# Patient Record
Sex: Female | Born: 1960
Health system: Southern US, Community
[De-identification: ages and names within clinical notes are randomized; demographics above are authoritative.]

## PROBLEM LIST (undated history)

## (undated) DIAGNOSIS — I10 Essential (primary) hypertension: Secondary | ICD-10-CM

## (undated) DIAGNOSIS — F32A Depression, unspecified: Secondary | ICD-10-CM

## (undated) DIAGNOSIS — F419 Anxiety disorder, unspecified: Secondary | ICD-10-CM

## (undated) DIAGNOSIS — K635 Polyp of colon: Secondary | ICD-10-CM

## (undated) DIAGNOSIS — G5139 Clonic hemifacial spasm, unspecified: Secondary | ICD-10-CM

## (undated) DIAGNOSIS — M62838 Other muscle spasm: Secondary | ICD-10-CM

## (undated) DIAGNOSIS — F329 Major depressive disorder, single episode, unspecified: Secondary | ICD-10-CM

## (undated) HISTORY — DX: Depression, unspecified: F32.A

## (undated) HISTORY — DX: Major depressive disorder, single episode, unspecified: F32.9

## (undated) HISTORY — PX: POLYPECTOMY: SHX149

## (undated) HISTORY — DX: Anxiety disorder, unspecified: F41.9

## (undated) HISTORY — DX: Polyp of colon: K63.5

## (undated) HISTORY — DX: Clonic hemifacial spasm, unspecified: G51.39

## (undated) HISTORY — PX: WISDOM TOOTH EXTRACTION: SHX21

## (undated) HISTORY — PX: TUBAL LIGATION: SHX77

---
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
## 2012-02-29 ENCOUNTER — Emergency Department (HOSPITAL_BASED_OUTPATIENT_CLINIC_OR_DEPARTMENT_OTHER)
Admission: EM | Admit: 2012-02-29 | Discharge: 2012-02-29 | Disposition: A | Discharge status: Home / Self Care | Payer: Self-pay | Attending: Emergency Medicine | Admitting: Emergency Medicine

## 2012-02-29 ENCOUNTER — Encounter (HOSPITAL_BASED_OUTPATIENT_CLINIC_OR_DEPARTMENT_OTHER): Payer: Self-pay | Admitting: Family Medicine

## 2012-02-29 DIAGNOSIS — J329 Chronic sinusitis, unspecified: Secondary | ICD-10-CM

## 2012-02-29 DIAGNOSIS — J3489 Other specified disorders of nose and nasal sinuses: Secondary | ICD-10-CM | POA: Insufficient documentation

## 2012-02-29 DIAGNOSIS — I10 Essential (primary) hypertension: Secondary | ICD-10-CM | POA: Insufficient documentation

## 2012-02-29 HISTORY — DX: Essential (primary) hypertension: I10

## 2012-02-29 MED ORDER — AMOXICILLIN 500 MG PO CAPS: 500.0000 mg | ORAL_CAPSULE | Freq: Three times a day (TID) | ORAL | Status: AC

## 2012-02-29 NOTE — ED Notes (Signed)
Pt c/o nasal congestion and drainage x 2-3 wks. Pt sts she is taking mucinex without relief. Denies fever, n/v/d.

## 2012-02-29 NOTE — ED Provider Notes (Signed)
History     CSN: [id number]  Arrival date & time 02/29/12  1243   First MD Initiated Contact with Patient 02/29/12 1310      Chief Complaint  Patient presents with  . Nasal Congestion    (Consider location/radiation/quality/duration/timing/severity/associated sxs/prior treatment) Patient is a 51 y.o. female presenting with URI. The history is provided by the patient. No language interpreter was used.  URI The primary symptoms include ear pain, sore throat, cough and nausea. The current episode started more than 1 week ago. This is a new problem. The problem has been gradually worsening.  Symptoms associated with the illness include facial pain. The illness is not associated with chills.   Pt complains of sinus congestion and drainage.  Pt reports phelgm in throat.  Pt has a history of facial spasm.  And htn Past Medical History  Diagnosis Date  . Hypertension     Past Surgical History  Procedure Date  . Tubal ligation     No family history on file.  History  Substance Use Topics  . Smoking status: Never Smoker   . Smokeless tobacco: Not on file  . Alcohol Use: No    OB History    Grav Para Term Preterm Abortions TAB SAB Ect Mult Living                  Review of Systems  Constitutional: Negative for chills.  HENT: Positive for ear pain and sore throat.   Respiratory: Positive for cough.   Gastrointestinal: Positive for nausea.  All other systems reviewed and are negative.    Allergies  Review of patient's allergies indicates no known allergies.  Home Medications   Current Outpatient Rx  Name Route Sig Dispense Refill  . LISINOPRIL-HYDROCHLOROTHIAZIDE PO Oral Take by mouth.    Arloa Koh D PO Oral Take by mouth.    . TOPIRAMATE 50 MG PO TABS Oral Take 50 mg by mouth 2 (two) times daily.      BP 143/92  Pulse 86  Temp 98.9 F (37.2 C) (Oral)  Resp 18  Ht 5\' 4"  (1.626 m)  Wt 208 lb 14.4 oz (94.756 kg)  BMI 35.86 kg/m2  SpO2 99%  Physical Exam    Vitals reviewed. Constitutional: She is oriented to person, place, and time. She appears well-developed and well-nourished.  HENT:  Head: Normocephalic and atraumatic.  Eyes: Conjunctivae and EOM are normal. Pupils are equal, round, and reactive to light.       Tender maxillary sinuses,   Facial twitch under left eye  Neck: Normal range of motion.  Cardiovascular: Normal rate and regular rhythm.   Pulmonary/Chest: Effort normal.  Abdominal: Soft.  Musculoskeletal: Normal range of motion.  Neurological: She is alert and oriented to person, place, and time. She has normal reflexes.  Skin: Skin is warm.  Psychiatric: She has a normal mood and affect.    ED Course  Procedures (including critical care time)  Labs Reviewed - No data to display No results found.   No diagnosis found.    MDM  Pt given rx for amoxicillian.   Pt advised decongestant and afrin        Lonia Skinner [place], [place] 02/29/12 1342

## 2012-02-29 NOTE — ED Provider Notes (Signed)
History/physical exam/procedure(s) were performed by non-physician practitioner and as supervising physician I was immediately available for consultation/collaboration. I have reviewed all notes and am in agreement with care and plan.   Hiroko Tregre S Arturo Freundlich, MD 02/29/12 1539 

## 2012-12-13 ENCOUNTER — Emergency Department (HOSPITAL_BASED_OUTPATIENT_CLINIC_OR_DEPARTMENT_OTHER)
Admission: EM | Admit: 2012-12-13 | Discharge: 2012-12-13 | Disposition: A | Discharge status: Home / Self Care | Payer: Self-pay | Attending: Emergency Medicine | Admitting: Emergency Medicine

## 2012-12-13 ENCOUNTER — Encounter (HOSPITAL_BASED_OUTPATIENT_CLINIC_OR_DEPARTMENT_OTHER): Payer: Self-pay

## 2012-12-13 DIAGNOSIS — I10 Essential (primary) hypertension: Secondary | ICD-10-CM | POA: Insufficient documentation

## 2012-12-13 DIAGNOSIS — R51 Headache: Secondary | ICD-10-CM | POA: Insufficient documentation

## 2012-12-13 DIAGNOSIS — Z79899 Other long term (current) drug therapy: Secondary | ICD-10-CM | POA: Insufficient documentation

## 2012-12-13 DIAGNOSIS — J019 Acute sinusitis, unspecified: Secondary | ICD-10-CM | POA: Insufficient documentation

## 2012-12-13 DIAGNOSIS — Z8739 Personal history of other diseases of the musculoskeletal system and connective tissue: Secondary | ICD-10-CM | POA: Insufficient documentation

## 2012-12-13 HISTORY — DX: Other muscle spasm: M62.838

## 2012-12-13 MED ORDER — AMOXICILLIN 500 MG PO CAPS: 500.0000 mg | ORAL_CAPSULE | Freq: Three times a day (TID) | ORAL | Status: DC

## 2012-12-13 NOTE — ED Notes (Signed)
C/o sinus drainage x 1 week

## 2012-12-13 NOTE — ED Provider Notes (Signed)
History     CSN: [id number]  Arrival date & time 12/13/12  1944   First MD Initiated Contact with Patient 12/13/12 2035      Chief Complaint  Patient presents with  . URI    (Consider location/radiation/quality/duration/timing/severity/associated sxs/prior treatment) Patient is a 53 y.o. female presenting with URI. The history is provided by the patient.  URI Presenting symptoms: congestion and facial pain   Severity:  Moderate Onset quality:  Gradual Duration:  1 week Timing:  Constant Progression:  Worsening Chronicity:  New Relieved by:  Nothing Worsened by:  Nothing tried Ineffective treatments:  None tried Associated symptoms: sinus pain and swollen glands   Associated symptoms: no headaches and no myalgias     Past Medical History  Diagnosis Date  . Hypertension   . Muscle spasms of head and/or neck     Past Surgical History  Procedure Laterality Date  . Tubal ligation      No family history on file.  History  Substance Use Topics  . Smoking status: Never Smoker   . Smokeless tobacco: Not on file  . Alcohol Use: No    OB History   Grav Para Term Preterm Abortions TAB SAB Ect Mult Living                  Review of Systems  HENT: Positive for congestion.   Musculoskeletal: Negative for myalgias.  Neurological: Negative for headaches.  All other systems reviewed and are negative.    Allergies  Review of patient's allergies indicates no known allergies.  Home Medications   Current Outpatient Rx  Name  Route  Sig  Dispense  Refill  . LISINOPRIL-HYDROCHLOROTHIAZIDE PO   Oral   Take by mouth.         . Pseudoephedrine-Guaifenesin (MUCINEX D PO)   Oral   Take by mouth.         . topiramate (TOPAMAX) 50 MG tablet   Oral   Take 50 mg by mouth 2 (two) times daily.           BP 124/105  Pulse 94  Temp(Src) 98.5 F (36.9 C) (Oral)  Resp 20  SpO2 99%  Physical Exam  Constitutional: She is oriented to person, place, and time.  She appears well-developed and well-nourished. No distress.  HENT:  Head: Normocephalic and atraumatic.  Right Ear: External ear normal.  Left Ear: External ear normal.  Mouth/Throat: Oropharynx is clear and moist. No oropharyngeal exudate.  There is ttp over the maxillary sinuses.  Neck: Normal range of motion. Neck supple. No thyromegaly present.  Cardiovascular: Normal rate and regular rhythm.   No murmur heard. Pulmonary/Chest: Breath sounds normal. No respiratory distress.  Abdominal: Soft. Bowel sounds are normal.  Musculoskeletal: Normal range of motion. She exhibits no edema.  Lymphadenopathy:    She has cervical adenopathy.  Neurological: She is oriented to person, place, and time.  Skin: Skin is warm and dry. She is not diaphoretic.    ED Course  Procedures (including critical care time)  Labs Reviewed - No data to display No results found.   No diagnosis found.    MDM  Will treat for sinusitis with amoxicillin.  Follow up prn.        Geoffery Lyons, MD 12/13/12 2042

## 2015-03-03 ENCOUNTER — Emergency Department (HOSPITAL_BASED_OUTPATIENT_CLINIC_OR_DEPARTMENT_OTHER)
Admission: EM | Admit: 2015-03-03 | Discharge: 2015-03-03 | Disposition: A | Discharge status: Home / Self Care | Payer: 59 | Attending: Emergency Medicine | Admitting: Emergency Medicine

## 2015-03-03 ENCOUNTER — Encounter (HOSPITAL_BASED_OUTPATIENT_CLINIC_OR_DEPARTMENT_OTHER): Payer: Self-pay | Admitting: Emergency Medicine

## 2015-03-03 ENCOUNTER — Emergency Department (HOSPITAL_BASED_OUTPATIENT_CLINIC_OR_DEPARTMENT_OTHER): Payer: 59

## 2015-03-03 DIAGNOSIS — Z792 Long term (current) use of antibiotics: Secondary | ICD-10-CM | POA: Insufficient documentation

## 2015-03-03 DIAGNOSIS — I10 Essential (primary) hypertension: Secondary | ICD-10-CM | POA: Diagnosis not present

## 2015-03-03 DIAGNOSIS — R42 Dizziness and giddiness: Secondary | ICD-10-CM | POA: Diagnosis present

## 2015-03-03 DIAGNOSIS — Z79899 Other long term (current) drug therapy: Secondary | ICD-10-CM | POA: Diagnosis not present

## 2015-03-03 DIAGNOSIS — Z8739 Personal history of other diseases of the musculoskeletal system and connective tissue: Secondary | ICD-10-CM | POA: Diagnosis not present

## 2015-03-03 LAB — CBC WITH DIFFERENTIAL/PLATELET
Basophils Absolute: 0 10*3/uL (ref 0.0–0.1)
Basophils Relative: 0 % (ref 0–1)
EOS ABS: 0.1 10*3/uL (ref 0.0–0.7)
Eosinophils Relative: 1 % (ref 0–5)
HEMATOCRIT: 43.3 % (ref 36.0–46.0)
HEMOGLOBIN: 14.4 g/dL (ref 12.0–15.0)
LYMPHS ABS: 3.8 10*3/uL (ref 0.7–4.0)
LYMPHS PCT: 52 % — AB (ref 12–46)
MCH: 29.4 pg (ref 26.0–34.0)
MCHC: 33.3 g/dL (ref 30.0–36.0)
MCV: 88.4 fL (ref 78.0–100.0)
Monocytes Absolute: 0.7 10*3/uL (ref 0.1–1.0)
Monocytes Relative: 10 % (ref 3–12)
NEUTROS ABS: 2.6 10*3/uL (ref 1.7–7.7)
NEUTROS PCT: 37 % — AB (ref 43–77)
Platelets: 251 10*3/uL (ref 150–400)
RBC: 4.9 MIL/uL (ref 3.87–5.11)
RDW: 15.2 % (ref 11.5–15.5)
WBC: 7.2 10*3/uL (ref 4.0–10.5)

## 2015-03-03 LAB — BASIC METABOLIC PANEL
ANION GAP: 9 (ref 5–15)
BUN: 12 mg/dL (ref 6–20)
CHLORIDE: 107 mmol/L (ref 101–111)
CO2: 27 mmol/L (ref 22–32)
CREATININE: 0.9 mg/dL (ref 0.44–1.00)
Calcium: 9.7 mg/dL (ref 8.9–10.3)
GFR calc non Af Amer: >60 mL/min (ref >60)
Glucose, Bld: 122 mg/dL — ABNORMAL HIGH (ref 65–99)
POTASSIUM: 3.5 mmol/L (ref 3.5–5.1)
SODIUM: 143 mmol/L (ref 135–145)

## 2015-03-03 LAB — TROPONIN I

## 2015-03-03 MED ORDER — MECLIZINE HCL 25 MG PO TABS
25.0000 mg | ORAL_TABLET | Freq: Once | ORAL | Status: AC
  Administered 2015-03-03: 25 mg via ORAL
  Filled 2015-03-03: qty 1

## 2015-03-03 NOTE — ED Provider Notes (Signed)
CSN: [id number]   Arrival date & time 03/03/15 2133  History  This chart was scribed for Geoffery Lyons, MD by Bethel Born, ED Scribe. This patient was seen in room MH08/MH08 and the patient's care was started at 9:57 PM.  Chief Complaint  Patient presents with  . Dizziness    HPI The history is provided by the patient and a relative. No language interpreter was used.   Janet Case is a 54 y.o. female with PMHx of HTN who presents to the Emergency Department complaining of intermittent dizziness and light headedness with gradual onset tonight. Associated symptoms include mild headache. She associates the symptoms with hypertension because she has had similar episodes in the past when her blood pressure was elevated. Pt denies chest pain, palpitations, SOB, numbness, tingling, and weakness.  Her antihypertensive was changed a few months ago from Lisinopril to Amlodipine and Valsartan-Hydrochlorothiazide.  Past Medical History  Diagnosis Date  . Hypertension   . Muscle spasms of head and/or neck     Past Surgical History  Procedure Laterality Date  . Tubal ligation      History reviewed. No pertinent family history.  Social History  Substance Use Topics  . Smoking status: Never Smoker   . Smokeless tobacco: None  . Alcohol Use: No     Review of Systems 10 Systems reviewed and all are negative for acute change except as noted in the HPI.  Home Medications   Prior to Admission medications   Medication Sig Start Date End Date Taking? Authorizing Provider  AMLODIPINE BESYLATE PO Take by mouth.   Yes Historical Provider, MD  VALSARTAN-HYDROCHLOROTHIAZIDE PO Take by mouth.   Yes Historical Provider, MD  amoxicillin (AMOXIL) 500 MG capsule Take 1 capsule (500 mg total) by mouth 3 (three) times daily. 12/13/12   Geoffery Lyons, MD  LISINOPRIL-HYDROCHLOROTHIAZIDE PO Take by mouth.    Historical Provider, MD  Pseudoephedrine-Guaifenesin [facility] D PO) Take by mouth.    Historical  Provider, MD  topiramate (TOPAMAX) 50 MG tablet Take 50 mg by mouth 2 (two) times daily.    Historical Provider, MD    Allergies  Review of patient's allergies indicates no known allergies.  Triage Vitals: BP 191/130 mmHg  Pulse 107  Temp(Src) 98.7 F (37.1 C) (Oral)  Resp 18  Ht 5\' 4"  (1.626 m)  Wt 223 lb (101.152 kg)  BMI 38.26 kg/m2  SpO2 98%  Physical Exam  Constitutional: She is oriented to person, place, and time. She appears well-developed and well-nourished. No distress.  HENT:  Head: Normocephalic and atraumatic.  Eyes: EOM are normal. Pupils are equal, round, and reactive to light.  Neck: Normal range of motion.  Cardiovascular: Normal rate, regular rhythm and normal heart sounds.   Pulmonary/Chest: Effort normal and breath sounds normal. No respiratory distress. She has no wheezes. She has no rales.  Abdominal: Soft. She exhibits no distension. There is no tenderness.  Musculoskeletal: Normal range of motion.  Neurological: She is alert and oriented to person, place, and time. No cranial nerve deficit. She exhibits normal muscle tone. Coordination normal.  Skin: Skin is warm and dry.  Psychiatric: She has a normal mood and affect. Judgment normal.  Nursing note and vitals reviewed.    ED Course  Procedures   DIAGNOSTIC STUDIES: Oxygen Saturation is 98% on RA, normal by my interpretation.    COORDINATION OF CARE: 10:03 PM Discussed treatment plan which includes lab work, EKG, CT head without contrast, and meclizine  with pt at  bedside and pt agreed to plan.  Labs Reviewed  BASIC METABOLIC PANEL  CBC WITH DIFFERENTIAL/PLATELET  TROPONIN I    Imaging Review No results found.   EKG Interpretation  Date/Time:  Sunday March 03 2015 22:34:19 EDT Ventricular Rate:  94 PR Interval:  148 QRS Duration: 90 QT Interval:  368 QTC Calculation: 460 R Axis:   -23 Text Interpretation:  Normal sinus rhythm Possible Left atrial enlargement Possible Anterior  infarct , age undetermined Abnormal ECG Confirmed by Angellica Maddison  MD, Dreux Mcgroarty ([id number]) on 03/03/2015 10:35:17 PM       MDM   Final diagnoses:  None   Patient presents with complaints of dizziness and elevated blood pressure. She is neurologically intact and laboratory studies are unremarkable. Her blood pressures were observed while in the emergency department and have significantly improved. She is now 145/110 and I do not feel as though he is in need of emergent intervention. Patient has reported to me that she did not take her blood pressure medication today and I suspect that may be the reason for her symptoms. She will be discharged to home and advised to take her medications as directed.  I personally performed the services described in this documentation, which was scribed in my presence. The recorded information has been reviewed and is accurate.    Geoffery Lyons, MD 03/03/15 [phone]

## 2015-03-03 NOTE — ED Notes (Signed)
Patient reports lightheaded and dizziness which began tonight.  Reports MD changed blood pressure medication due to hypertension.  Patient reports taking valsartan and "another BP pill".

## 2015-03-03 NOTE — ED Notes (Signed)
Pt is refusing CT scan at this time. States she "just doesn't want one right now".

## 2015-03-03 NOTE — Discharge Instructions (Signed)
Take a record of your blood pressures over the next several days which you can present to your doctor at your next appointment.  Continue your medications as previously prescribed.   Hypertension Hypertension, commonly called high blood pressure, is when the force of blood pumping through your arteries is too strong. Your arteries are the blood vessels that carry blood from your heart throughout your body. A blood pressure reading consists of a higher number over a lower number, such as 110/72. The higher number (systolic) is the pressure inside your arteries when your heart pumps. The lower number (diastolic) is the pressure inside your arteries when your heart relaxes. Ideally you want your blood pressure below 120/80. Hypertension forces your heart to work harder to pump blood. Your arteries may become narrow or stiff. Having hypertension puts you at risk for heart disease, stroke, and other problems.  RISK FACTORS Some risk factors for high blood pressure are controllable. Others are not.  Risk factors you cannot control include:   Race. You may be at higher risk if you are African American.  Age. Risk increases with age.  Gender. Men are at higher risk than women before age 64 years. After age 32, women are at higher risk than men. Risk factors you can control include:  Not getting enough exercise or physical activity.  Being overweight.  Getting too much fat, sugar, calories, or salt in your diet.  Drinking too much alcohol. SIGNS AND SYMPTOMS Hypertension does not usually cause signs or symptoms. Extremely high blood pressure (hypertensive crisis) may cause headache, anxiety, shortness of breath, and nosebleed. DIAGNOSIS  To check if you have hypertension, your health care provider will measure your blood pressure while you are seated, with your arm held at the level of your heart. It should be measured at least twice using the same arm. Certain conditions can cause a difference  in blood pressure between your right and left arms. A blood pressure reading that is higher than normal on one occasion does not mean that you need treatment. If one blood pressure reading is high, ask your health care provider about having it checked again. TREATMENT  Treating high blood pressure includes making lifestyle changes and possibly taking medicine. Living a healthy lifestyle can help lower high blood pressure. You may need to change some of your habits. Lifestyle changes may include:  Following the DASH diet. This diet is high in fruits, vegetables, and whole grains. It is low in salt, red meat, and added sugars.  Getting at least 2 hours of brisk physical activity every week.  Losing weight if necessary.  Not smoking.  Limiting alcoholic beverages.  Learning ways to reduce stress. If lifestyle changes are not enough to get your blood pressure under control, your health care provider may prescribe medicine. You may need to take more than one. Work closely with your health care provider to understand the risks and benefits. HOME CARE INSTRUCTIONS  Have your blood pressure rechecked as directed by your health care provider.   Take medicines only as directed by your health care provider. Follow the directions carefully. Blood pressure medicines must be taken as prescribed. The medicine does not work as well when you skip doses. Skipping doses also puts you at risk for problems.   Do not smoke.   Monitor your blood pressure at home as directed by your health care provider. SEEK MEDICAL CARE IF:   You think you are having a reaction to medicines taken.  You have  recurrent headaches or feel dizzy.  You have swelling in your ankles.  You have trouble with your vision. SEEK IMMEDIATE MEDICAL CARE IF:  You develop a severe headache or confusion.  You have unusual weakness, numbness, or feel faint.  You have severe chest or abdominal pain.  You vomit  repeatedly.  You have trouble breathing. MAKE SURE YOU:   Understand these instructions.  Will watch your condition.  Will get help right away if you are not doing well or get worse. Document Released: 06/29/2005 Document Revised: 11/13/2013 Document Reviewed: 04/21/2013 [facility] Patient Information 2015 [place], [place]. This information is not intended to replace advice given to you by your health care provider. Make sure you discuss any questions you have with your health care provider.

## 2015-04-01 ENCOUNTER — Telehealth: Payer: Self-pay | Admitting: *Deleted

## 2015-04-01 NOTE — Telephone Encounter (Signed)
Unable to reach patient at time of Pre-Visit Call.  Left message for patient to return call when available.    

## 2015-04-02 ENCOUNTER — Ambulatory Visit: Payer: 59 | Admitting: Physician Assistant

## 2015-04-10 ENCOUNTER — Ambulatory Visit (INDEPENDENT_AMBULATORY_CARE_PROVIDER_SITE_OTHER): Payer: 59 | Admitting: Physician Assistant

## 2015-04-10 ENCOUNTER — Encounter (INDEPENDENT_AMBULATORY_CARE_PROVIDER_SITE_OTHER): Payer: Self-pay

## 2015-04-10 ENCOUNTER — Encounter: Payer: Self-pay | Admitting: Physician Assistant

## 2015-04-10 VITALS — BP 138/98 | HR 86 | Temp 98.1°F | Resp 16 | Ht 64.0 in | Wt 225.0 lb

## 2015-04-10 DIAGNOSIS — G514 Facial myokymia: Secondary | ICD-10-CM | POA: Diagnosis not present

## 2015-04-10 DIAGNOSIS — G4700 Insomnia, unspecified: Secondary | ICD-10-CM | POA: Diagnosis not present

## 2015-04-10 DIAGNOSIS — G4710 Hypersomnia, unspecified: Secondary | ICD-10-CM

## 2015-04-10 DIAGNOSIS — I10 Essential (primary) hypertension: Secondary | ICD-10-CM | POA: Diagnosis not present

## 2015-04-10 DIAGNOSIS — Z803 Family history of malignant neoplasm of breast: Secondary | ICD-10-CM

## 2015-04-10 MED ORDER — VALSARTAN-HYDROCHLOROTHIAZIDE 160-25 MG PO TABS: 1.0000 | ORAL_TABLET | Freq: Every day | ORAL | Status: DC

## 2015-04-10 MED ORDER — TOPIRAMATE 50 MG PO TABS: 50.0000 mg | ORAL_TABLET | Freq: Two times a day (BID) | ORAL | Status: DC

## 2015-04-10 MED ORDER — ZOLPIDEM TARTRATE 10 MG PO TABS: 10.0000 mg | ORAL_TABLET | Freq: Every evening | ORAL | Status: DC | PRN

## 2015-04-10 NOTE — Progress Notes (Signed)
Pre visit review using our clinic review tool, if applicable. No additional management support is needed unless otherwise documented below in the visit note/SLS  

## 2015-04-10 NOTE — Patient Instructions (Signed)
Please go to the lab for blood work. I will call you with your results. You will be contacted by Neurology for assessment, Pulmonology for sleep study and by [facility] for a screening mammogram.  Please restart the Topamax 50 mg tablet once daily x 5 days. Then increase to 50 mg twice daily. Take Ambien at bedtime as directed.  Take the new dose of Diovan daily for BP control.   Follow-up with me in 3-4 weeks for a complete physical and follow-up on your BP.

## 2015-04-10 NOTE — Progress Notes (Signed)
Patient presents to clinic today to establish care.  Acute Concerns: Insomnia -- Patient endorses 3-4 hours of sleep per night. Has significant difficulty falling asleep. Body mass index is 38.6 kg/(m^2). Has history of hypertension currently medicated. Denies ever having a sleep study.  Chronic Issues: Hypertension -- Is currently on Diovan-HCT and Amlodipine daily. Endorses taking as directed. Is watching salt intake. Patient denies chest pain, palpitations, lightheadedness, dizziness, vision changes or frequent headaches.  Anxiety/Depression -- Endorses mainly stemming from insomnia. Is not currently on medication. Endorses job stressors. Denies panic attack but endorses some depressed mood from time to time. Does not feel that medication is warranted at present.  Hemifacial Spasms -- Long-standing history, previous workup with Neurology. MRI negative for tumor per patient. Was previously having Botox injections with good relief of symptoms. Denies new or worsening symptoms. Last injection was > 1 year ago. Endorses Topamax previously helping some with symptoms before starting her injections.  Health Maintenance: Immunizations -- defers to physical Mammogram -- Overdue. + Family hx Breast cancer. Agrees to screening mammogram.  Past Medical History  Diagnosis Date  . Hypertension   . Muscle spasms of head and/or neck   . Clonic hemifacial spasm     Left Facial  . Colon polyps   . Depression   . Anxiety     Past Surgical History  Procedure Laterality Date  . Tubal ligation    . Polypectomy      Oral  . Vaginal wound closure / repair  1977  . Wisdom tooth extraction      Current Outpatient Prescriptions on File Prior to Visit  Medication Sig Dispense Refill  . Pseudoephedrine-Guaifenesin (MUCINEX D PO) Take by mouth as needed.      No current facility-administered medications on file prior to visit.    No Known Allergies  Family History  Problem Relation Age of  Onset  . Hyperlipidemia Mother 24    Deceased  . Heart disease Mother   . Congestive Heart Failure Mother   . Hypertension Mother   . Hypertension Sister   . Kidney disease Mother   . Kidney disease Father 80    Deceased  . Diabetes Mother   . Diabetes Sister   . Breast cancer Sister   . Depression Sister   . Anxiety disorder Sister   . Dementia Mother   . Thyroid cancer Sister     Thyroid Removed  . Heart defect Mother   . Heart attack Father   . Leukemia Maternal Aunt   . Allergies Daughter   . GI problems Daughter     Social History   Social History  . Marital Status: Single    Spouse Name: N/A  . Number of Children: 2  . Years of Education: N/A   Occupational History  . Customer Service Representative    Social History Main Topics  . Smoking status: Never Smoker   . Smokeless tobacco: Not on file  . Alcohol Use: No  . Drug Use: No  . Sexual Activity: Not on file   Other Topics Concern  . Not on file   Social History Narrative   Review of Systems  Constitutional: Negative for fever and weight loss.  Respiratory: Negative for cough and shortness of breath.   Cardiovascular: Negative for chest pain and palpitations.  Skin: Negative for rash.  Neurological: Positive for tremors. Negative for dizziness, sensory change, speech change, focal weakness, seizures, loss of consciousness and headaches.  Psychiatric/Behavioral: Negative for depression.  The patient has insomnia. The patient is not nervous/anxious.    BP 138/98 mmHg  Pulse 86  Temp(Src) 98.1 F (36.7 C) (Oral)  Resp 16  Ht 5\' 4"  (1.626 m)  Wt 225 lb (102.059 kg)  BMI 38.60 kg/m2  SpO2 99%  Physical Exam  Constitutional: She is oriented to person, place, and time and well-developed, well-nourished, and in no distress.  HENT:  Head: Normocephalic and atraumatic.  Eyes: Conjunctivae are normal.  Cardiovascular: Normal rate, regular rhythm, normal heart sounds and intact distal pulses.     Pulmonary/Chest: Effort normal and breath sounds normal. No respiratory distress. She has no wheezes. She has no rales. She exhibits no tenderness.  Neurological: She is alert and oriented to person, place, and time.  Left-sided facial twitching noted on exam.  Skin: Skin is warm and dry. No rash noted.  Vitals reviewed.   Recent Results (from the past 2160 hour(s))  Basic metabolic panel     Status: Abnormal   Collection Time: 03/03/15 10:30 PM  Result Value Ref Range   Sodium 143 135 - 145 mmol/L   Potassium 3.5 3.5 - 5.1 mmol/L   Chloride 107 101 - 111 mmol/L   CO2 27 22 - 32 mmol/L   Glucose, Bld 122 (H) 65 - 99 mg/dL   BUN 12 6 - 20 mg/dL   Creatinine, Ser 03/05/15 0.44 - 1.00 mg/dL   Calcium 9.7 8.9 - 3.06 mg/dL   GFR calc non Af Amer >60 >60 mL/min   GFR calc Af Amer >60 >60 mL/min    Comment: (NOTE) The eGFR has been calculated using the CKD EPI equation. This calculation has not been validated in all clinical situations. eGFR's persistently <60 mL/min signify possible Chronic Kidney Disease.    Anion gap 9 5 - 15  CBC with Differential     Status: Abnormal   Collection Time: 03/03/15 10:30 PM  Result Value Ref Range   WBC 7.2 4.0 - 10.5 K/uL   RBC 4.90 3.87 - 5.11 MIL/uL   Hemoglobin 14.4 12.0 - 15.0 g/dL   HCT 03/05/15 77.7 - 31.7 %   MCV 88.4 78.0 - 100.0 fL   MCH 29.4 26.0 - 34.0 pg   MCHC 33.3 30.0 - 36.0 g/dL   RDW 91.5 24.8 - 49.4 %   Platelets 251 150 - 400 K/uL   Neutrophils Relative % 37 (L) 43 - 77 %   Neutro Abs 2.6 1.7 - 7.7 K/uL   Lymphocytes Relative 52 (H) 12 - 46 %   Lymphs Abs 3.8 0.7 - 4.0 K/uL   Monocytes Relative 10 3 - 12 %   Monocytes Absolute 0.7 0.1 - 1.0 K/uL   Eosinophils Relative 1 0 - 5 %   Eosinophils Absolute 0.1 0.0 - 0.7 K/uL   Basophils Relative 0 0 - 1 %   Basophils Absolute 0.0 0.0 - 0.1 K/uL  Troponin I     Status: None   Collection Time: 03/03/15 10:30 PM  Result Value Ref Range   Troponin I <0.03 <0.031 ng/mL    Comment:         NO INDICATION OF MYOCARDIAL INJURY.   Basic Metabolic Panel (BMET)     Status: None   Collection Time: 04/10/15  2:44 PM  Result Value Ref Range   Sodium 142 135 - 145 mEq/L   Potassium 3.6 3.5 - 5.1 mEq/L   Chloride 102 96 - 112 mEq/L   CO2 30 19 - 32 mEq/L  Glucose, Bld 94 70 - 99 mg/dL   BUN 12 6 - 23 mg/dL   Creatinine, Ser 0.83 0.40 - 1.20 mg/dL   Calcium 9.9 8.4 - 10.5 mg/dL   GFR 91.36 >60.00 mL/min  TSH     Status: None   Collection Time: 04/10/15  2:44 PM  Result Value Ref Range   TSH 1.14 0.35 - 4.50 uIU/mL    Assessment/Plan: Insomnia Will begin short-term Ambien 10 mg nightly.   Family history of breast cancer Overdue for mammogram. Order placed for screening mammogram.  Facial twitching Long-standing. Patient endorses prior workup. Needs Neurologist in the area. Referral placed. Will check lab panel today. Will restart Topamax 50 mg daily x 5 days, then increase to BID.  Excessive somnolence disorder Body mass index is 38.6 kg/(m^2). With hypertension and heavy snoring. Concern for sleep apnea. Referral placed to Pulmonology for assessment and sleep study.  Essential hypertension Above goal. Will increase Diovan-HCT to 160-25 mg daily. DASH diet discussed. Follow-up 1 month.

## 2015-04-11 LAB — BASIC METABOLIC PANEL
BUN: 12 mg/dL (ref 6–23)
CO2: 30 mEq/L (ref 19–32)
CREATININE: 0.83 mg/dL (ref 0.40–1.20)
Calcium: 9.9 mg/dL (ref 8.4–10.5)
Chloride: 102 mEq/L (ref 96–112)
GFR: 91.36 mL/min (ref >60.00)
GLUCOSE: 94 mg/dL (ref 70–99)
POTASSIUM: 3.6 meq/L (ref 3.5–5.1)
Sodium: 142 mEq/L (ref 135–145)

## 2015-04-11 LAB — TSH: TSH: 1.14 u[IU]/mL (ref 0.35–4.50)

## 2015-04-12 DIAGNOSIS — G4700 Insomnia, unspecified: Secondary | ICD-10-CM | POA: Insufficient documentation

## 2015-04-12 DIAGNOSIS — Z803 Family history of malignant neoplasm of breast: Secondary | ICD-10-CM | POA: Insufficient documentation

## 2015-04-12 DIAGNOSIS — G4710 Hypersomnia, unspecified: Secondary | ICD-10-CM | POA: Insufficient documentation

## 2015-04-12 DIAGNOSIS — I10 Essential (primary) hypertension: Secondary | ICD-10-CM | POA: Insufficient documentation

## 2015-04-12 DIAGNOSIS — G514 Facial myokymia: Secondary | ICD-10-CM | POA: Insufficient documentation

## 2015-04-12 LAB — PTH, INTACT AND CALCIUM
Calcium: 10.3 mg/dL (ref 8.4–10.5)
PTH: 73 pg/mL — ABNORMAL HIGH (ref 14–64)

## 2015-04-12 NOTE — Assessment & Plan Note (Signed)
Overdue for mammogram. Order placed for screening mammogram.

## 2015-04-12 NOTE — Assessment & Plan Note (Signed)
Body mass index is 38.6 kg/(m^2). With hypertension and heavy snoring. Concern for sleep apnea. Referral placed to Pulmonology for assessment and sleep study.

## 2015-04-12 NOTE — Assessment & Plan Note (Signed)
Above goal. Will increase Diovan-HCT to 160-25 mg daily. DASH diet discussed. Follow-up 1 month.

## 2015-04-12 NOTE — Assessment & Plan Note (Signed)
Will begin short-term Ambien 10 mg nightly.

## 2015-04-12 NOTE — Assessment & Plan Note (Signed)
Long-standing. Patient endorses prior workup. Needs Neurologist in the area. Referral placed. Will check lab panel today. Will restart Topamax 50 mg daily x 5 days, then increase to BID.

## 2015-04-17 ENCOUNTER — Telehealth: Payer: Self-pay | Admitting: *Deleted

## 2015-04-17 DIAGNOSIS — R7989 Other specified abnormal findings of blood chemistry: Secondary | ICD-10-CM

## 2015-04-17 NOTE — Telephone Encounter (Signed)
-----   Message from Waldon Merl, PA-C sent at 04/15/2015  1:00 PM EDT ----- Labs great overall. Her parathyroid hormone level is very mildly elevated but calcium level looks good. Recommend she return for a repeat PTH and calcium level in 2 weeks. Has she heard from Neurology yet?

## 2015-04-17 NOTE — Telephone Encounter (Signed)
LMOM with contact name and number [for return call, if needed] RE: results and further provider instructions [pt already has lab appt]; new lab order placed/SLS

## 2015-04-22 ENCOUNTER — Encounter: Payer: Self-pay | Admitting: Physician Assistant

## 2015-04-25 ENCOUNTER — Other Ambulatory Visit: Payer: Self-pay | Admitting: Physician Assistant

## 2015-04-25 ENCOUNTER — Encounter: Payer: Self-pay | Admitting: Physician Assistant

## 2015-04-25 ENCOUNTER — Other Ambulatory Visit: Payer: 59

## 2015-04-25 ENCOUNTER — Ambulatory Visit (HOSPITAL_BASED_OUTPATIENT_CLINIC_OR_DEPARTMENT_OTHER)
Admission: RE | Admit: 2015-04-25 | Discharge: 2015-04-25 | Disposition: A | Discharge status: Home / Self Care | Payer: 59 | Source: Ambulatory Visit | Attending: Physician Assistant | Admitting: Physician Assistant

## 2015-04-25 DIAGNOSIS — R7989 Other specified abnormal findings of blood chemistry: Secondary | ICD-10-CM

## 2015-04-25 DIAGNOSIS — Z1231 Encounter for screening mammogram for malignant neoplasm of breast: Secondary | ICD-10-CM | POA: Insufficient documentation

## 2015-04-25 DIAGNOSIS — Z803 Family history of malignant neoplasm of breast: Secondary | ICD-10-CM

## 2015-04-29 ENCOUNTER — Telehealth: Payer: Self-pay | Admitting: Physician Assistant

## 2015-04-29 NOTE — Telephone Encounter (Signed)
Caller name: Lorin PicketScott [facility])   Can be reached: [phone] Opt 2 Ref # [id number]   Reason for call: He is calling in because he's stating that he is unable to perform the lab that was ordered. (PTH) .   Please advise.    Thanks.

## 2015-04-30 ENCOUNTER — Ambulatory Visit: Payer: 59 | Admitting: Neurology

## 2015-04-30 ENCOUNTER — Other Ambulatory Visit: Payer: 59

## 2015-04-30 DIAGNOSIS — Z803 Family history of malignant neoplasm of breast: Secondary | ICD-10-CM

## 2015-04-30 NOTE — Telephone Encounter (Signed)
Per Selena Battenody pt PTH needs to be redrawn. No charge to pt.

## 2015-04-30 NOTE — Telephone Encounter (Signed)
Spoke with patient about having lab recollected and she will have it done on her Oct.26 visit.

## 2015-04-30 NOTE — Addendum Note (Signed)
Addended by: Harley AltoPRICE, KRISTY M on: 04/30/2015 01:30 PM   Modules accepted: Orders

## 2015-04-30 NOTE — Telephone Encounter (Signed)
PTH ordered  They Did not receive frozen specimen only two SST.  So they will not complete this order no call back needed per  Name Lorin PicketScott [phone]

## 2015-04-30 NOTE — Telephone Encounter (Signed)
Called patient and left message to return call to speak with someone in the lab about message below....KMP

## 2015-05-01 LAB — PTH, INTACT AND CALCIUM
CALCIUM: 9.4 mg/dL (ref 8.4–10.5)
PTH: 47 pg/mL (ref 14–64)

## 2015-05-02 ENCOUNTER — Encounter: Payer: Self-pay | Admitting: Physician Assistant

## 2015-05-07 ENCOUNTER — Ambulatory Visit (INDEPENDENT_AMBULATORY_CARE_PROVIDER_SITE_OTHER): Payer: 59 | Admitting: Neurology

## 2015-05-07 ENCOUNTER — Encounter: Payer: Self-pay | Admitting: Neurology

## 2015-05-07 VITALS — BP 120/84 | HR 96 | Ht 64.0 in | Wt 222.1 lb

## 2015-05-07 DIAGNOSIS — G5711 Meralgia paresthetica, right lower limb: Secondary | ICD-10-CM

## 2015-05-07 DIAGNOSIS — G518 Other disorders of facial nerve: Secondary | ICD-10-CM | POA: Diagnosis not present

## 2015-05-07 DIAGNOSIS — R29810 Facial weakness: Secondary | ICD-10-CM

## 2015-05-07 DIAGNOSIS — G5139 Clonic hemifacial spasm, unspecified: Secondary | ICD-10-CM

## 2015-05-07 NOTE — Progress Notes (Signed)
Janet Case was seen today in neurologic consultation at the request of Piedad Climes, PA-C.  The consultation is for the evaluation of L hemifacial spasm.  She is accompanied by her daughter who supplements the history. The records that were made available to me were reviewed.  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  Last botox was many years ago, over 10 years.  She states that she got botox for about a year and it was helpful without SE.  She states that an ENT did the botox.  She stated that she was on topamax in the past and it helped some so it was restarted on 04/10/15 at 50 mg bid.  She was on a higher dosage in the past (up to ).  She has also been on gabapentin (sleep walking was SE).  She states that her L eye will wink but doesn't close as much as it did in the past.  Lack of sleep and stress will make it worse.  Never happens on the right side of the face.  States that it has actually improved a bit over the years.  Neuroimaging has previously been performed.  It is not available for my review today.  States that she had an MRI and CT brain in [place] about 8-10 years ago.    ALLERGIES:  No Known Allergies  CURRENT MEDICATIONS:  Outpatient Encounter Prescriptions as of 05/07/2015  Medication Sig  . amLODipine (NORVASC) 10 MG tablet Take 10 mg by mouth daily.  . Pseudoephedrine-Guaifenesin (MUCINEX D PO) Take by mouth as needed.   . topiramate (TOPAMAX) 50 MG tablet Take 1 tablet (50 mg total) by mouth 2 (two) times daily.  . valsartan-hydrochlorothiazide (DIOVAN HCT) 160-25 MG tablet Take 1 tablet by mouth daily.  Marland Kitchen zolpidem (AMBIEN) 10 MG tablet Take 1 tablet (10 mg total) by mouth at bedtime as needed for sleep.   No facility-administered encounter medications on file as of 05/07/2015.    PAST MEDICAL HISTORY:   Past Medical History  Diagnosis Date  .  Hypertension   . Muscle spasms of head and/or neck   . Clonic hemifacial spasm     Left Facial  . Colon polyps   . Depression   . Anxiety     PAST SURGICAL HISTORY:   Past Surgical History  Procedure Laterality Date  . Tubal ligation    . Polypectomy      Oral  . Vaginal wound closure / repair  1977  . Wisdom tooth extraction      SOCIAL HISTORY:   Social History   Social History  . Marital Status: Single    Spouse Name: N/A  . Number of Children: 2  . Years of Education: N/A   Occupational History  . Customer Service Representative     [employer]   Social History Main Topics  . Smoking status: Never Smoker   . Smokeless tobacco: Not on file  . Alcohol Use: No  . Drug Use: No  . Sexual Activity: Not on file   Other Topics Concern  . Not on file   Social History Narrative    FAMILY HISTORY:   Family Status  Relation Status Death Age  . Mother Deceased   . Father Deceased     ROS:  Intermittent, rare palpitations.  No lateralizing weakness or paresthesias except right lateral thigh.  A complete 10 system review of systems was obtained and was unremarkable apart from what is mentioned above.  PHYSICAL EXAMINATION:    VITALS:   Filed Vitals:   05/07/15 1050  BP: 120/84  Pulse: 96  Height:  (1.626 m)  Weight: 222 lb 1 oz (100.727 kg)  SpO2: 98%    GEN:  Normal appears female in no acute distress.  Appears stated age. HEENT:  Normocephalic, atraumatic. The mucous membranes are moist. The superficial temporal arteries are without ropiness or tenderness. Cardiovascular: Regular rate and rhythm. Lungs: Clear to auscultation bilaterally. Neck/Heme: There are no carotid bruits noted bilaterally.  NEUROLOGICAL: Orientation:  The patient is alert and oriented x 3.  Fund of knowledge is appropriate.  Recent and remote memory intact.  Attention span and concentration normal.  Repeats and names without difficulty. Cranial nerves: There is decreased forehead  wrinkle on the L. The pupils are equal round and reactive to light bilaterally. Fundoscopic exam reveals clear disc margins bilaterally. Extraocular muscles are intact and visual fields are full to confrontational testing. Speech is fluent and clear. Soft palate rises symmetrically and there is no tongue deviation. Hearing is intact to conversational tone. Tone: Tone is good throughout. Sensation: Sensation is intact to light touch and pinprick throughout (facial, trunk, extremities). Vibration is intact at the bilateral big toe. There is no extinction with double simultaneous stimulation. There is no sensory dermatomal level identified. Coordination:  The patient has no difficulty with RAM's or FNF bilaterally. Motor: Strength is 5/5 in the bilateral upper and lower extremities.  Shoulder shrug is equal and symmetric. There is no pronator drift.  There are no fasciculations noted. DTR's: Deep tendon reflexes are 2/4 at the bilateral biceps, triceps, brachioradialis, patella and achilles.  Plantar responses are downgoing bilaterally. Gait and Station: The patient is able to ambulate without difficulty. The patient is able to heel toe walk without any difficulty. The patient is able to ambulate in a tandem fashion. The patient is able to stand in the Romberg position. Abnormal Movements:  There is L eye twitch.  There is pulling of the L face over the nasalis, zygomaticus and around the orbicularis oris as well.  It can become clonic and sustained at times.   IMPRESSION/PLAN  1. L hemifacial spasm  -The facial asymmetry is likely from years of spasm, but given that it involved so much of the forehead and decreased forehead wrinkle, I do want to do an MRI of the brain.  She reports that she had one done about 10 years ago, but that is not available.  I want to do an MRI of the brain with and without gadolinium.  She was agreeable.  -I talked to her about Botox.  I did tell her that Botox can worsen  facial asymmetry.  I talked to her about risks and benefits.  I talked to her about the black box warning.  I talked to her about the risk of diplopia.  Talked to her about the risk of decreased blink.  Talked to her about ecchymosis.  She expressed complete understanding and willingness to proceed.  We will try to get prior authorization.  -She will continue on the Topamax, 50 mg twice a day.  She does think that has been somewhat beneficial. 2.  Right meralgia paresthetica  -describes sx's well.  Hers is intermittent and not very bothersome.  Weight loss could be of value 3.  Follow up will be at botox date as long  as receive insurance prior auth.  Much greater than 50% of this visit was spent in counseling with the patient and the family.  Total face to face time:  60 min.

## 2015-05-08 ENCOUNTER — Ambulatory Visit (INDEPENDENT_AMBULATORY_CARE_PROVIDER_SITE_OTHER): Payer: 59 | Admitting: Physician Assistant

## 2015-05-08 ENCOUNTER — Encounter: Payer: Self-pay | Admitting: Physician Assistant

## 2015-05-08 ENCOUNTER — Other Ambulatory Visit (HOSPITAL_COMMUNITY)
Admission: RE | Admit: 2015-05-08 | Discharge: 2015-05-08 | Disposition: A | Discharge status: Home / Self Care | Payer: 59 | Source: Ambulatory Visit | Attending: Physician Assistant | Admitting: Physician Assistant

## 2015-05-08 VITALS — BP 130/86 | HR 80 | Temp 98.4°F | Resp 16 | Ht 64.0 in | Wt 222.4 lb

## 2015-05-08 DIAGNOSIS — N760 Acute vaginitis: Secondary | ICD-10-CM | POA: Diagnosis present

## 2015-05-08 DIAGNOSIS — N814 Uterovaginal prolapse, unspecified: Secondary | ICD-10-CM

## 2015-05-08 DIAGNOSIS — Z0000 Encounter for general adult medical examination without abnormal findings: Secondary | ICD-10-CM | POA: Diagnosis not present

## 2015-05-08 DIAGNOSIS — I10 Essential (primary) hypertension: Secondary | ICD-10-CM | POA: Diagnosis not present

## 2015-05-08 DIAGNOSIS — G4700 Insomnia, unspecified: Secondary | ICD-10-CM | POA: Diagnosis not present

## 2015-05-08 DIAGNOSIS — Z113 Encounter for screening for infections with a predominantly sexual mode of transmission: Secondary | ICD-10-CM | POA: Diagnosis not present

## 2015-05-08 DIAGNOSIS — Z23 Encounter for immunization: Secondary | ICD-10-CM

## 2015-05-08 LAB — URINALYSIS, ROUTINE W REFLEX MICROSCOPIC
Bilirubin Urine: NEGATIVE
HGB URINE DIPSTICK: NEGATIVE
KETONES UR: NEGATIVE
LEUKOCYTES UA: NEGATIVE
Nitrite: NEGATIVE
Specific Gravity, Urine: 1.02 (ref 1.000–1.030)
Total Protein, Urine: NEGATIVE
URINE GLUCOSE: NEGATIVE
UROBILINOGEN UA: 0.2 (ref 0.0–1.0)
pH: 7 (ref 5.0–8.0)

## 2015-05-08 LAB — CBC
HCT: 43.8 % (ref 36.0–46.0)
Hemoglobin: 14.4 g/dL (ref 12.0–15.0)
MCHC: 32.8 g/dL (ref 30.0–36.0)
MCV: 88.2 fl (ref 78.0–100.0)
PLATELETS: 245 10*3/uL (ref 150.0–400.0)
RBC: 4.96 Mil/uL (ref 3.87–5.11)
RDW: 15.4 % (ref 11.5–15.5)
WBC: 5.8 10*3/uL (ref 4.0–10.5)

## 2015-05-08 LAB — COMPREHENSIVE METABOLIC PANEL
ALT: 20 U/L (ref 0–35)
AST: 19 U/L (ref 0–37)
Albumin: 4 g/dL (ref 3.5–5.2)
Alkaline Phosphatase: 108 U/L (ref 39–117)
BUN: 16 mg/dL (ref 6–23)
CALCIUM: 9.4 mg/dL (ref 8.4–10.5)
CO2: 25 meq/L (ref 19–32)
CREATININE: 1.05 mg/dL (ref 0.40–1.20)
Chloride: 109 mEq/L (ref 96–112)
GFR: 69.63 mL/min (ref >60.00)
GLUCOSE: 106 mg/dL — AB (ref 70–99)
Potassium: 3.5 mEq/L (ref 3.5–5.1)
Sodium: 143 mEq/L (ref 135–145)
Total Bilirubin: 0.3 mg/dL (ref 0.2–1.2)
Total Protein: 6.8 g/dL (ref 6.0–8.3)

## 2015-05-08 LAB — LIPID PANEL
CHOLESTEROL: 187 mg/dL (ref 0–200)
HDL: 58 mg/dL (ref >39.00)
LDL Cholesterol: 115 mg/dL — ABNORMAL HIGH (ref 0–99)
NonHDL: 129
Total CHOL/HDL Ratio: 3
Triglycerides: 68 mg/dL (ref 0.0–149.0)
VLDL: 13.6 mg/dL (ref 0.0–40.0)

## 2015-05-08 LAB — TSH: TSH: 0.9 u[IU]/mL (ref 0.35–4.50)

## 2015-05-08 LAB — HEMOGLOBIN A1C: HEMOGLOBIN A1C: 6.2 % (ref 4.6–6.5)

## 2015-05-08 MED ORDER — ZOLPIDEM TARTRATE 10 MG PO TABS: 10.0000 mg | ORAL_TABLET | Freq: Every evening | ORAL | Status: DC | PRN

## 2015-05-08 NOTE — Assessment & Plan Note (Signed)
Referral to GYN placed for assessment and treatment. PAP sent today.

## 2015-05-08 NOTE — Patient Instructions (Addendum)
Please go to the lab for blood work.  I will call with results. We will treat any abnormal findings.  You will be contacted by GYN for further assessment of pelvic prolapse.  I encourage you to increase hydration and the amount of fiber in your diet.  Start a daily probiotic (Align, Culturelle, Digestive Advantage, etc.). If no bowel movement within 24 hours, take 2 Tbs of Milk of Magnesia in a 4 oz glass of warmed prune juice every 2-3 days to help promote bowel movement. If no results within 24 hours, then repeat above regimen, adding a Dulcolax stool softener to regimen. If this does not promote a bowel movement, please call the office.   Preventive Care for Adults, Female A healthy lifestyle and preventive care can promote health and wellness. Preventive health guidelines for women include the following key practices.  A routine yearly physical is a good way to check with your health care Murielle Stang about your health and preventive screening. It is a chance to share any concerns and updates on your health and to receive a thorough exam.  Visit your dentist for a routine exam and preventive care every 6 months. Brush your teeth twice a day and floss once a day. Good oral hygiene prevents tooth decay and gum disease.  The frequency of eye exams is based on your age, health, family medical history, use of contact lenses, and other factors. Follow your health care Dashauna Heymann's recommendations for frequency of eye exams.  Eat a healthy diet. Foods like vegetables, fruits, whole grains, low-fat dairy products, and lean protein foods contain the nutrients you need without too many calories. Decrease your intake of foods high in solid fats, added sugars, and salt. Eat the right amount of calories for you.Get information about a proper diet from your health care Isla Sabree, if necessary.  Regular physical exercise is one of the most important things you can do for your health. Most adults should get at  least 150 minutes of moderate-intensity exercise (any activity that increases your heart rate and causes you to sweat) each week. In addition, most adults need muscle-strengthening exercises on 2 or more days a week.  Maintain a healthy weight. The body mass index (BMI) is a screening tool to identify possible weight problems. It provides an estimate of body fat based on height and weight. Your health care Chinelo Benn can find your BMI and can help you achieve or maintain a healthy weight.For adults 20 years and older:  A BMI below 18.5 is considered underweight.  A BMI of 18.5 to 24.9 is normal.  A BMI of 25 to 29.9 is considered overweight.  A BMI of 30 and above is considered obese.  Maintain normal blood lipids and cholesterol levels by exercising and minimizing your intake of saturated fat. Eat a balanced diet with plenty of fruit and vegetables. Blood tests for lipids and cholesterol should begin at age 24 and be repeated every 5 years. If your lipid or cholesterol levels are high, you are over 50, or you are at high risk for heart disease, you may need your cholesterol levels checked more frequently.Ongoing high lipid and cholesterol levels should be treated with medicines if diet and exercise are not working.  If you smoke, find out from your health care Kabe Mckoy how to quit. If you do not use tobacco, do not start.  Lung cancer screening is recommended for adults aged 53-80 years who are at high risk for developing lung cancer because of a history  of smoking. A yearly low-dose CT scan of the lungs is recommended for people who have at least a 30-pack-year history of smoking and are a current smoker or have quit within the past 15 years. A pack year of smoking is smoking an average of 1 pack of cigarettes a day for 1 year (for example: 1 pack a day for 30 years or 2 packs a day for 15 years). Yearly screening should continue until the smoker has stopped smoking for at least 15 years. Yearly  screening should be stopped for people who develop a health problem that would prevent them from having lung cancer treatment.  If you are pregnant, do not drink alcohol. If you are breastfeeding, be very cautious about drinking alcohol. If you are not pregnant and choose to drink alcohol, do not have more than 1 drink per day. One drink is considered to be 12 ounces (355 mL) of beer, 5 ounces (148 mL) of wine, or 1.5 ounces (44 mL) of liquor.  Avoid use of street drugs. Do not share needles with anyone. Ask for help if you need support or instructions about stopping the use of drugs.  High blood pressure causes heart disease and increases the risk of stroke. Your blood pressure should be checked at least every 1 to 2 years. Ongoing high blood pressure should be treated with medicines if weight loss and exercise do not work.  If you are 13-17 years old, ask your health care Desyre Calma if you should take aspirin to prevent strokes.  Diabetes screening is done by taking a blood sample to check your blood glucose level after you have not eaten for a certain period of time (fasting). If you are not overweight and you do not have risk factors for diabetes, you should be screened once every 3 years starting at age 20. If you are overweight or obese and you are 75-73 years of age, you should be screened for diabetes every year as part of your cardiovascular risk assessment.  Breast cancer screening is essential preventive care for women. You should practice "breast self-awareness." This means understanding the normal appearance and feel of your breasts and may include breast self-examination. Any changes detected, no matter how small, should be reported to a health care Sai Moura. Women in their 26s and 30s should have a clinical breast exam (CBE) by a health care Sherin Murdoch as part of a regular health exam every 1 to 3 years. After age 50, women should have a CBE every year. Starting at age 84, women should  consider having a mammogram (breast X-ray test) every year. Women who have a family history of breast cancer should talk to their health care Detrick Dani about genetic screening. Women at a high risk of breast cancer should talk to their health care providers about having an MRI and a mammogram every year.  Breast cancer gene (BRCA)-related cancer risk assessment is recommended for women who have family members with BRCA-related cancers. BRCA-related cancers include breast, ovarian, tubal, and peritoneal cancers. Having family members with these cancers may be associated with an increased risk for harmful changes (mutations) in the breast cancer genes BRCA1 and BRCA2. Results of the assessment will determine the need for genetic counseling and BRCA1 and BRCA2 testing.  Your health care Kai Calico may recommend that you be screened regularly for cancer of the pelvic organs (ovaries, uterus, and vagina). This screening involves a pelvic examination, including checking for microscopic changes to the surface of your cervix (Pap test). You  may be encouraged to have this screening done every 3 years, beginning at age 49.  For women ages 3-65, health care providers may recommend pelvic exams and Pap testing every 3 years, or they may recommend the Pap and pelvic exam, combined with testing for human papilloma virus (HPV), every 5 years. Some types of HPV increase your risk of cervical cancer. Testing for HPV may also be done on women of any age with unclear Pap test results.  Other health care providers may not recommend any screening for nonpregnant women who are considered low risk for pelvic cancer and who do not have symptoms. Ask your health care Appollonia Klee if a screening pelvic exam is right for you.  If you have had past treatment for cervical cancer or a condition that could lead to cancer, you need Pap tests and screening for cancer for at least 20 years after your treatment. If Pap tests have been  discontinued, your risk factors (such as having a new sexual partner) need to be reassessed to determine if screening should resume. Some women have medical problems that increase the chance of getting cervical cancer. In these cases, your health care Rosalva Neary may recommend more frequent screening and Pap tests.  Colorectal cancer can be detected and often prevented. Most routine colorectal cancer screening begins at the age of 86 years and continues through age 49 years. However, your health care Mikhala Kenan may recommend screening at an earlier age if you have risk factors for colon cancer. On a yearly basis, your health care Jakarie Pember may provide home test kits to check for hidden blood in the stool. Use of a small camera at the end of a tube, to directly examine the colon (sigmoidoscopy or colonoscopy), can detect the earliest forms of colorectal cancer. Talk to your health care Rozelia Catapano about this at age 53, when routine screening begins. Direct exam of the colon should be repeated every 5-10 years through age 26 years, unless early forms of precancerous polyps or small growths are found.  People who are at an increased risk for hepatitis B should be screened for this virus. You are considered at high risk for hepatitis B if:  You were born in a country where hepatitis B occurs often. Talk with your health care Telly Jawad about which countries are considered high risk.  Your parents were born in a high-risk country and you have not received a shot to protect against hepatitis B (hepatitis B vaccine).  You have HIV or AIDS.  You use needles to inject street drugs.  You live with, or have sex with, someone who has hepatitis B.  You get hemodialysis treatment.  You take certain medicines for conditions like cancer, organ transplantation, and autoimmune conditions.  Hepatitis C blood testing is recommended for all people born from [birth year] through 1967 and any individual with known risks for hepatitis  C.  Practice safe sex. Use condoms and avoid high-risk sexual practices to reduce the spread of sexually transmitted infections (STIs). STIs include gonorrhea, chlamydia, syphilis, trichomonas, herpes, HPV, and human immunodeficiency virus (HIV). Herpes, HIV, and HPV are viral illnesses that have no cure. They can result in disability, cancer, and death.  You should be screened for sexually transmitted illnesses (STIs) including gonorrhea and chlamydia if:  You are sexually active and are younger than 24 years.  You are older than 24 years and your health care Jacqueleen Pulver tells you that you are at risk for this type of infection.  Your sexual activity has  changed since you were last screened and you are at an increased risk for chlamydia or gonorrhea. Ask your health care Kaelie Henigan if you are at risk.  If you are at risk of being infected with HIV, it is recommended that you take a prescription medicine daily to prevent HIV infection. This is called preexposure prophylaxis (PrEP). You are considered at risk if:  You are sexually active and do not regularly use condoms or know the HIV status of your partner(s).  You take drugs by injection.  You are sexually active with a partner who has HIV.  Talk with your health care Kierria Feigenbaum about whether you are at high risk of being infected with HIV. If you choose to begin PrEP, you should first be tested for HIV. You should then be tested every 3 months for as long as you are taking PrEP.  Osteoporosis is a disease in which the bones lose minerals and strength with aging. This can result in serious bone fractures or breaks. The risk of osteoporosis can be identified using a bone density scan. Women ages 79 years and over and women at risk for fractures or osteoporosis should discuss screening with their health care providers. Ask your health care Gunda Maqueda whether you should take a calcium supplement or vitamin D to reduce the rate of  osteoporosis.  Menopause can be associated with physical symptoms and risks. Hormone replacement therapy is available to decrease symptoms and risks. You should talk to your health care Matalyn Nawaz about whether hormone replacement therapy is right for you.  Use sunscreen. Apply sunscreen liberally and repeatedly throughout the day. You should seek shade when your shadow is shorter than you. Protect yourself by wearing long sleeves, pants, a wide-brimmed hat, and sunglasses year round, whenever you are outdoors.  Once a month, do a whole body skin exam, using a mirror to look at the skin on your back. Tell your health care Londa Mackowski of new moles, moles that have irregular borders, moles that are larger than a pencil eraser, or moles that have changed in shape or color.  Stay current with required vaccines (immunizations).  Influenza vaccine. All adults should be immunized every year.  Tetanus, diphtheria, and acellular pertussis (Td, Tdap) vaccine. Pregnant women should receive 1 dose of Tdap vaccine during each pregnancy. The dose should be obtained regardless of the length of time since the last dose. Immunization is preferred during the 27th-36th week of gestation. An adult who has not previously received Tdap or who does not know her vaccine status should receive 1 dose of Tdap. This initial dose should be followed by tetanus and diphtheria toxoids (Td) booster doses every 10 years. Adults with an unknown or incomplete history of completing a 3-dose immunization series with Td-containing vaccines should begin or complete a primary immunization series including a Tdap dose. Adults should receive a Td booster every 10 years.  Varicella vaccine. An adult without evidence of immunity to varicella should receive 2 doses or a second dose if she has previously received 1 dose. Pregnant females who do not have evidence of immunity should receive the first dose after pregnancy. This first dose should be  obtained before leaving the health care facility. The second dose should be obtained 4-8 weeks after the first dose.  Human papillomavirus (HPV) vaccine. Females aged 13-26 years who have not received the vaccine previously should obtain the 3-dose series. The vaccine is not recommended for use in pregnant females. However, pregnancy testing is not needed before receiving  a dose. If a female is found to be pregnant after receiving a dose, no treatment is needed. In that case, the remaining doses should be delayed until after the pregnancy. Immunization is recommended for any person with an immunocompromised condition through the age of 50 years if she did not get any or all doses earlier. During the 3-dose series, the second dose should be obtained 4-8 weeks after the first dose. The third dose should be obtained 24 weeks after the first dose and 16 weeks after the second dose.  Zoster vaccine. One dose is recommended for adults aged 37 years or older unless certain conditions are present.  Measles, mumps, and rubella (MMR) vaccine. Adults born before [birth year] generally are considered immune to measles and mumps. Adults born in [birth year] or later should have 1 or more doses of MMR vaccine unless there is a contraindication to the vaccine or there is laboratory evidence of immunity to each of the three diseases. A routine second dose of MMR vaccine should be obtained at least 28 days after the first dose for students attending postsecondary schools, health care workers, or international travelers. People who received inactivated measles vaccine or an unknown type of measles vaccine during 1963-1967 should receive 2 doses of MMR vaccine. People who received inactivated mumps vaccine or an unknown type of mumps vaccine before 1979 and are at high risk for mumps infection should consider immunization with 2 doses of MMR vaccine. For females of childbearing age, rubella immunity should be determined. If there is no evidence  of immunity, females who are not pregnant should be vaccinated. If there is no evidence of immunity, females who are pregnant should delay immunization until after pregnancy. Unvaccinated health care workers born before [birth year] who lack laboratory evidence of measles, mumps, or rubella immunity or laboratory confirmation of disease should consider measles and mumps immunization with 2 doses of MMR vaccine or rubella immunization with 1 dose of MMR vaccine.  Pneumococcal 13-valent conjugate (PCV13) vaccine. When indicated, a person who is uncertain of his immunization history and has no record of immunization should receive the PCV13 vaccine. All adults 55 years of age and older should receive this vaccine. An adult aged 35 years or older who has certain medical conditions and has not been previously immunized should receive 1 dose of PCV13 vaccine. This PCV13 should be followed with a dose of pneumococcal polysaccharide (PPSV23) vaccine. Adults who are at high risk for pneumococcal disease should obtain the PPSV23 vaccine at least 8 weeks after the dose of PCV13 vaccine. Adults older than 56 years of age who have normal immune system function should obtain the PPSV23 vaccine dose at least 1 year after the dose of PCV13 vaccine.  Pneumococcal polysaccharide (PPSV23) vaccine. When PCV13 is also indicated, PCV13 should be obtained first. All adults aged 71 years and older should be immunized. An adult younger than age 18 years who has certain medical conditions should be immunized. Any person who resides in a nursing home or long-term care facility should be immunized. An adult smoker should be immunized. People with an immunocompromised condition and certain other conditions should receive both PCV13 and PPSV23 vaccines. People with human immunodeficiency virus (HIV) infection should be immunized as soon as possible after diagnosis. Immunization during chemotherapy or radiation therapy should be avoided. Routine use  of PPSV23 vaccine is not recommended for American Indians, [place] Natives, or people younger than 65 years unless there are medical conditions that require PPSV23 vaccine. When indicated, people  who have unknown immunization and have no record of immunization should receive PPSV23 vaccine. One-time revaccination 5 years after the first dose of PPSV23 is recommended for people aged 19-64 years who have chronic kidney failure, nephrotic syndrome, asplenia, or immunocompromised conditions. People who received 1-2 doses of PPSV23 before age 58 years should receive another dose of PPSV23 vaccine at age 40 years or later if at least 5 years have passed since the previous dose. Doses of PPSV23 are not needed for people immunized with PPSV23 at or after age 72 years.  Meningococcal vaccine. Adults with asplenia or persistent complement component deficiencies should receive 2 doses of quadrivalent meningococcal conjugate (MenACWY-D) vaccine. The doses should be obtained at least 2 months apart. Microbiologists working with certain meningococcal bacteria, [place] recruits, people at risk during an outbreak, and people who travel to or live in countries with a high rate of meningitis should be immunized. A first-year college student up through age 59 years who is living in a residence hall should receive a dose if she did not receive a dose on or after her 16th birthday. Adults who have certain high-risk conditions should receive one or more doses of vaccine.  Hepatitis A vaccine. Adults who wish to be protected from this disease, have certain high-risk conditions, work with hepatitis A-infected animals, work in hepatitis A research labs, or travel to or work in countries with a high rate of hepatitis A should be immunized. Adults who were previously unvaccinated and who anticipate close contact with an international adoptee during the first 60 days after arrival in the [place] from a country with a high rate of  hepatitis A should be immunized.  Hepatitis B vaccine. Adults who wish to be protected from this disease, have certain high-risk conditions, may be exposed to blood or other infectious body fluids, are household contacts or sex partners of hepatitis B positive people, are clients or workers in certain care facilities, or travel to or work in countries with a high rate of hepatitis B should be immunized.  Haemophilus influenzae type b (Hib) vaccine. A previously unvaccinated person with asplenia or sickle cell disease or having a scheduled splenectomy should receive 1 dose of Hib vaccine. Regardless of previous immunization, a recipient of a hematopoietic stem cell transplant should receive a 3-dose series 6-12 months after her successful transplant. Hib vaccine is not recommended for adults with HIV infection. Preventive Services / Frequency Ages 76 to 43 years  Blood pressure check.** / Every 3-5 years.  Lipid and cholesterol check.** / Every 5 years beginning at age 63.  Clinical breast exam.** / Every 3 years for women in their 68s and 46s.  BRCA-related cancer risk assessment.** / For women who have family members with a BRCA-related cancer (breast, ovarian, tubal, or peritoneal cancers).  Pap test.** / Every 2 years from ages 82 through 51. Every 3 years starting at age 12 through age 94 or 89 with a history of 3 consecutive normal Pap tests.  HPV screening.** / Every 3 years from ages 61 through ages 32 to 45 with a history of 3 consecutive normal Pap tests.  Hepatitis C blood test.** / For any individual with known risks for hepatitis C.  Skin self-exam. / Monthly.  Influenza vaccine. / Every year.  Tetanus, diphtheria, and acellular pertussis (Tdap, Td) vaccine.** / Consult your health care Harue Pribble. Pregnant women should receive 1 dose of Tdap vaccine during each pregnancy. 1 dose of Td every 10 years.  Varicella vaccine.** /  Consult your health care Annemarie Sebree. Pregnant females  who do not have evidence of immunity should receive the first dose after pregnancy.  HPV vaccine. / 3 doses over 6 months, if 56 and younger. The vaccine is not recommended for use in pregnant females. However, pregnancy testing is not needed before receiving a dose.  Measles, mumps, rubella (MMR) vaccine.** / You need at least 1 dose of MMR if you were born in 1957 or later. You may also need a 2nd dose. For females of childbearing age, rubella immunity should be determined. If there is no evidence of immunity, females who are not pregnant should be vaccinated. If there is no evidence of immunity, females who are pregnant should delay immunization until after pregnancy.  Pneumococcal 13-valent conjugate (PCV13) vaccine.** / Consult your health care Brynlyn Dade.  Pneumococcal polysaccharide (PPSV23) vaccine.** / 1 to 2 doses if you smoke cigarettes or if you have certain conditions.  Meningococcal vaccine.** / 1 dose if you are age 6 to 7 years and a Market researcher living in a residence hall, or have one of several medical conditions, you need to get vaccinated against meningococcal disease. You may also need additional booster doses.  Hepatitis A vaccine.** / Consult your health care Ceyda Peterka.  Hepatitis B vaccine.** / Consult your health care Divinity Kyler.  Haemophilus influenzae type b (Hib) vaccine.** / Consult your health care Dameir Gentzler. Ages 68 to 3 years  Blood pressure check.** / Every year.  Lipid and cholesterol check.** / Every 5 years beginning at age 55 years.  Lung cancer screening. / Every year if you are aged 49-80 years and have a 30-pack-year history of smoking and currently smoke or have quit within the past 15 years. Yearly screening is stopped once you have quit smoking for at least 15 years or develop a health problem that would prevent you from having lung cancer treatment.  Clinical breast exam.** / Every year after age 32 years.  BRCA-related cancer risk  assessment.** / For women who have family members with a BRCA-related cancer (breast, ovarian, tubal, or peritoneal cancers).  Mammogram.** / Every year beginning at age 53 years and continuing for as long as you are in good health. Consult with your health care Oliviagrace Crisanti.  Pap test.** / Every 3 years starting at age 15 years through age 64 or 41 years with a history of 3 consecutive normal Pap tests.  HPV screening.** / Every 3 years from ages 78 years through ages 19 to 31 years with a history of 3 consecutive normal Pap tests.  Fecal occult blood test (FOBT) of stool. / Every year beginning at age 62 years and continuing until age 66 years. You may not need to do this test if you get a colonoscopy every 10 years.  Flexible sigmoidoscopy or colonoscopy.** / Every 5 years for a flexible sigmoidoscopy or every 10 years for a colonoscopy beginning at age 67 years and continuing until age 68 years.  Hepatitis C blood test.** / For all people born from [birth year] through 1967 and any individual with known risks for hepatitis C.  Skin self-exam. / Monthly.  Influenza vaccine. / Every year.  Tetanus, diphtheria, and acellular pertussis (Tdap/Td) vaccine.** / Consult your health care Malak Duchesneau. Pregnant women should receive 1 dose of Tdap vaccine during each pregnancy. 1 dose of Td every 10 years.  Varicella vaccine.** / Consult your health care Janiece Scovill. Pregnant females who do not have evidence of immunity should receive the first dose after pregnancy.  Zoster  vaccine.** / 1 dose for adults aged 60 years or older.  Measles, mumps, rubella (MMR) vaccine.** / You need at least 1 dose of MMR if you were born in 1957 or later. You may also need a second dose. For females of childbearing age, rubella immunity should be determined. If there is no evidence of immunity, females who are not pregnant should be vaccinated. If there is no evidence of immunity, females who are pregnant should delay immunization until  after pregnancy.  Pneumococcal 13-valent conjugate (PCV13) vaccine.** / Consult your health care Miyah Hampshire.  Pneumococcal polysaccharide (PPSV23) vaccine.** / 1 to 2 doses if you smoke cigarettes or if you have certain conditions.  Meningococcal vaccine.** / Consult your health care Terrence Wishon.  Hepatitis A vaccine.** / Consult your health care Nadalee Neiswender.  Hepatitis B vaccine.** / Consult your health care Bunyan Brier.  Haemophilus influenzae type b (Hib) vaccine.** / Consult your health care Areona Homer. Ages 65 years and over  Blood pressure check.** / Every year.  Lipid and cholesterol check.** / Every 5 years beginning at age 74 years.  Lung cancer screening. / Every year if you are aged 55-80 years and have a 30-pack-year history of smoking and currently smoke or have quit within the past 15 years. Yearly screening is stopped once you have quit smoking for at least 15 years or develop a health problem that would prevent you from having lung cancer treatment.  Clinical breast exam.** / Every year after age 56 years.  BRCA-related cancer risk assessment.** / For women who have family members with a BRCA-related cancer (breast, ovarian, tubal, or peritoneal cancers).  Mammogram.** / Every year beginning at age 20 years and continuing for as long as you are in good health. Consult with your health care Lorcan Shelp.  Pap test.** / Every 3 years starting at age 66 years through age 22 or 26 years with 3 consecutive normal Pap tests. Testing can be stopped between 65 and 70 years with 3 consecutive normal Pap tests and no abnormal Pap or HPV tests in the past 10 years.  HPV screening.** / Every 3 years from ages 67 years through ages 6 or 85 years with a history of 3 consecutive normal Pap tests. Testing can be stopped between 65 and 70 years with 3 consecutive normal Pap tests and no abnormal Pap or HPV tests in the past 10 years.  Fecal occult blood test (FOBT) of stool. / Every year beginning at  age 78 years and continuing until age 67 years. You may not need to do this test if you get a colonoscopy every 10 years.  Flexible sigmoidoscopy or colonoscopy.** / Every 5 years for a flexible sigmoidoscopy or every 10 years for a colonoscopy beginning at age 27 years and continuing until age 48 years.  Hepatitis C blood test.** / For all people born from [birth year] through 1967 and any individual with known risks for hepatitis C.  Osteoporosis screening.** / A one-time screening for women ages 29 years and over and women at risk for fractures or osteoporosis.  Skin self-exam. / Monthly.  Influenza vaccine. / Every year.  Tetanus, diphtheria, and acellular pertussis (Tdap/Td) vaccine.** / 1 dose of Td every 10 years.  Varicella vaccine.** / Consult your health care Anaise Sterbenz.  Zoster vaccine.** / 1 dose for adults aged 18 years or older.  Pneumococcal 13-valent conjugate (PCV13) vaccine.** / Consult your health care Harvir Patry.  Pneumococcal polysaccharide (PPSV23) vaccine.** / 1 dose for all adults aged 47 years and older.  Meningococcal vaccine.** / Consult your health care Windsor Zirkelbach.  Hepatitis A vaccine.** / Consult your health care Wynonna Fitzhenry.  Hepatitis B vaccine.** / Consult your health care Kmari Halter.  Haemophilus influenzae type b (Hib) vaccine.** / Consult your health care Rutherford Alarie. ** Family history and personal history of risk and conditions may change your health care Amillion Scobee's recommendations.   This information is not intended to replace advice given to you by your health care Hartleigh Edmonston. Make sure you discuss any questions you have with your health care Dilcia Rybarczyk.   Document Released: 08/25/2001 Document Revised: 07/20/2014 Document Reviewed: 11/24/2010 Elsevier Interactive Patient Education [employer].

## 2015-05-08 NOTE — Progress Notes (Signed)
Patient presents to clinic today for annual exam.  Patient is fasting for labs.  Acute Concerns: Denies acute concerns today.  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 Is improving with Topamax. Upcoming MRI scheduled. Patient states they will be restarting her Botox.  Insomnia -- Doing well with ambien. Denies side effect. Requesting refills.  Hypertension -- Doing well on Diovan-HCT. Patient denies chest pain, palpitations, lightheadedness, dizziness, vision changes or frequent headaches.  BP Readings from Last 3 Encounters:  05/08/15 130/86  05/07/15 120/84  04/10/15 138/98   Health Maintenance: Immunizations -- Flu shot up-to-date. Will be getting TDaP today. Colonoscopy -- Endorses had in 2012. Will get records from [place] -- 04/25/15 PAP -- Overdue. Will be obtained today.  Past Medical History  Diagnosis Date  . Hypertension   . Muscle spasms of head and/or neck   . Clonic hemifacial spasm     Left Facial  . Colon polyps   . Depression   . Anxiety     Past Surgical History  Procedure Laterality Date  . Tubal ligation    . Polypectomy      Oral  . Vaginal wound closure / repair  1977  . Wisdom tooth extraction      Current Outpatient Prescriptions on File Prior to Visit  Medication Sig Dispense Refill  . amLODipine (NORVASC) 10 MG tablet Take 10 mg by mouth daily.    . Pseudoephedrine-Guaifenesin (MUCINEX D PO) Take by mouth as needed.     . topiramate (TOPAMAX) 50 MG tablet Take 1 tablet (50 mg total) by mouth 2 (two) times daily. 60 tablet 1  . valsartan-hydrochlorothiazide (DIOVAN HCT) 160-25 MG tablet Take 1 tablet by mouth daily. 30 tablet 3   No current facility-administered medications on file prior to visit.    No Known Allergies  Family History  Problem Relation Age of Onset  . Hyperlipidemia Mother 73    Deceased  . Heart disease Mother   . Congestive Heart Failure Mother   . Hypertension Mother     . Hypertension Sister   . Kidney disease Mother   . Kidney disease Father 78    Deceased  . Diabetes Mother   . Diabetes Sister   . Breast cancer Sister   . Depression Sister   . Anxiety disorder Sister   . Dementia Mother   . Thyroid cancer Sister     Thyroid Removed  . Heart defect Mother   . Heart attack Father   . Leukemia Maternal Aunt   . Allergies Daughter   . GI problems Daughter     Social History   Social History  . Marital Status: Single    Spouse Name: N/A  . Number of Children: 2  . Years of Education: N/A   Occupational History  . Customer Service Representative     [employer]   Social History Main Topics  . Smoking status: Never Smoker   . Smokeless tobacco: Not on file  . Alcohol Use: No  . Drug Use: No  . Sexual Activity: Not on file   Other Topics Concern  . Not on file   Social History Narrative   Review of Systems  Constitutional: Negative for fever and weight loss.  HENT: Negative for ear discharge, ear pain, hearing loss and tinnitus.   Eyes: Negative for blurred vision, double vision, photophobia and pain.  Respiratory: Negative for cough and shortness of breath.   Cardiovascular: Negative for chest pain and palpitations.  Gastrointestinal: Negative for heartburn, nausea, vomiting, abdominal pain, diarrhea, constipation, blood in stool and melena.  Genitourinary: Negative for dysuria, urgency, frequency, hematuria and flank pain.  Musculoskeletal: Negative for falls.  Neurological: Negative for dizziness, loss of consciousness and headaches.  Endo/Heme/Allergies: Negative for environmental allergies.  Psychiatric/Behavioral: Negative for depression, suicidal ideas, hallucinations and substance abuse. The patient is not nervous/anxious and does not have insomnia.    BP 130/86 mmHg  Pulse 80  Temp(Src) 98.4 F (36.9 C) (Oral)  Resp 16  Ht 5' 4" (1.626 m)  Wt 222 lb 6 oz (100.869 kg)  BMI 38.15 kg/m2  SpO2 100%  Physical Exam   Constitutional: She is oriented to person, place, and time and well-developed, well-nourished, and in no distress.  HENT:  Head: Normocephalic and atraumatic.  Right Ear: Tympanic membrane, external ear and ear canal normal.  Left Ear: Tympanic membrane, external ear and ear canal normal.  Nose: Nose normal. No mucosal edema.  Mouth/Throat: Uvula is midline, oropharynx is clear and moist and mucous membranes are normal. No oropharyngeal exudate or posterior oropharyngeal erythema.  Eyes: Conjunctivae are normal. Pupils are equal, round, and reactive to light.  Neck: Neck supple. No thyromegaly present.  Cardiovascular: Normal rate, regular rhythm, normal heart sounds and intact distal pulses.   Pulmonary/Chest: Effort normal and breath sounds normal. No respiratory distress. She has no wheezes. She has no rales.  Abdominal: Soft. Bowel sounds are normal. She exhibits no distension and no mass. There is no tenderness. There is no rebound and no guarding.  Genitourinary: Vagina normal and cervix normal. Cervix exhibits no motion tenderness. No vaginal discharge found.  Grade I uterine prolapse noted.  Lymphadenopathy:    She has no cervical adenopathy.  Neurological: She is alert and oriented to person, place, and time. No cranial nerve deficit.  Skin: Skin is warm and dry. No rash noted.  Psychiatric: Affect normal.  Vitals reviewed.   Recent Results (from the past 2160 hour(s))  Basic metabolic panel     Status: Abnormal   Collection Time: 03/03/15 10:30 PM  Result Value Ref Range   Sodium 143 135 - 145 mmol/L   Potassium 3.5 3.5 - 5.1 mmol/L   Chloride 107 101 - 111 mmol/L   CO2 27 22 - 32 mmol/L   Glucose, Bld 122 (H) 65 - 99 mg/dL   BUN 12 6 - 20 mg/dL   Creatinine, Ser 0.90 0.44 - 1.00 mg/dL   Calcium 9.7 8.9 - 10.3 mg/dL   GFR calc non Af Amer >60 >60 mL/min   GFR calc Af Amer >60 >60 mL/min    Comment: (NOTE) The eGFR has been calculated using the CKD EPI equation. This  calculation has not been validated in all clinical situations. eGFR's persistently <60 mL/min signify possible Chronic Kidney Disease.    Anion gap 9 5 - 15  CBC with Differential     Status: Abnormal   Collection Time: 03/03/15 10:30 PM  Result Value Ref Range   WBC 7.2 4.0 - 10.5 K/uL   RBC 4.90 3.87 - 5.11 MIL/uL   Hemoglobin 14.4 12.0 - 15.0 g/dL   HCT 43.3 36.0 - 46.0 %   MCV 88.4 78.0 - 100.0 fL   MCH 29.4 26.0 - 34.0 pg   MCHC 33.3 30.0 - 36.0 g/dL   RDW 15.2 11.5 - 15.5 %   Platelets 251 150 - 400 K/uL   Neutrophils Relative % 37 (L) 43 - 77 %   Neutro Abs  2.6 1.7 - 7.7 K/uL   Lymphocytes Relative 52 (H) 12 - 46 %   Lymphs Abs 3.8 0.7 - 4.0 K/uL   Monocytes Relative 10 3 - 12 %   Monocytes Absolute 0.7 0.1 - 1.0 K/uL   Eosinophils Relative 1 0 - 5 %   Eosinophils Absolute 0.1 0.0 - 0.7 K/uL   Basophils Relative 0 0 - 1 %   Basophils Absolute 0.0 0.0 - 0.1 K/uL  Troponin I     Status: None   Collection Time: 03/03/15 10:30 PM  Result Value Ref Range   Troponin I <0.03 <0.031 ng/mL    Comment:        NO INDICATION OF MYOCARDIAL INJURY.   PTH, Intact and Calcium     Status: Abnormal   Collection Time: 04/10/15  2:44 PM  Result Value Ref Range   PTH 73 (H) 14 - 64 pg/mL   Calcium 10.3 8.4 - 10.5 mg/dL  Basic Metabolic Panel (BMET)     Status: None   Collection Time: 04/10/15  2:44 PM  Result Value Ref Range   Sodium 142 135 - 145 mEq/L   Potassium 3.6 3.5 - 5.1 mEq/L   Chloride 102 96 - 112 mEq/L   CO2 30 19 - 32 mEq/L   Glucose, Bld 94 70 - 99 mg/dL   BUN 12 6 - 23 mg/dL   Creatinine, Ser 0.83 0.40 - 1.20 mg/dL   Calcium 9.9 8.4 - 10.5 mg/dL   GFR 91.36 >60.00 mL/min  TSH     Status: None   Collection Time: 04/10/15  2:44 PM  Result Value Ref Range   TSH 1.14 0.35 - 4.50 uIU/mL  PTH, intact and calcium     Status: None   Collection Time: 04/30/15  4:33 PM  Result Value Ref Range   PTH 47 14 - 64 pg/mL   Calcium 9.4 8.4 - 10.5 mg/dL     Assessment/Plan: Physical exam Depression screen negative. Health Maintenance reviewed -- PAP updated today. TDaP given today. Preventive schedule discussed and handout given in AVS. Will obtain fasting labs today.   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 PAP sent today.  Insomnia Doing well. Medications refilled.  Essential hypertension Repeat BP looks good. Continue current regimen. Will check CMP today.

## 2015-05-08 NOTE — Assessment & Plan Note (Signed)
Doing well.  Medications refilled. 

## 2015-05-08 NOTE — Assessment & Plan Note (Signed)
Depression screen negative. Health Maintenance reviewed -- PAP updated today. TDaP given today. Preventive schedule discussed and handout given in AVS. Will obtain fasting labs today.

## 2015-05-08 NOTE — Progress Notes (Signed)
Pre visit review using our clinic review tool, if applicable. No additional management support is needed unless otherwise documented below in the visit note/SLS  

## 2015-05-08 NOTE — Addendum Note (Signed)
Addended by: Regis BillSCATES, Yolette Hastings L on: 05/08/2015 01:52 PM   Modules accepted: Orders

## 2015-05-08 NOTE — Assessment & Plan Note (Signed)
Repeat BP looks good. Continue current regimen. Will check CMP today.

## 2015-05-09 ENCOUNTER — Telehealth: Payer: Self-pay | Admitting: *Deleted

## 2015-05-09 ENCOUNTER — Other Ambulatory Visit: Payer: 59

## 2015-05-09 NOTE — Telephone Encounter (Signed)
[facility] Forwarded to [employer]. JG//CMA

## 2015-05-10 ENCOUNTER — Encounter: Payer: Self-pay | Admitting: Physician Assistant

## 2015-05-10 ENCOUNTER — Encounter: Payer: Self-pay | Admitting: *Deleted

## 2015-05-10 LAB — CYTOLOGY - PAP

## 2015-05-10 LAB — CERVICOVAGINAL ANCILLARY ONLY
Bacterial vaginitis: NEGATIVE
Candida vaginitis: NEGATIVE

## 2015-05-11 ENCOUNTER — Encounter: Payer: Self-pay | Admitting: Physician Assistant

## 2015-05-13 ENCOUNTER — Encounter: Payer: Self-pay | Admitting: Physician Assistant

## 2015-05-15 ENCOUNTER — Ambulatory Visit
Admission: RE | Admit: 2015-05-15 | Discharge: 2015-05-15 | Disposition: A | Discharge status: Home / Self Care | Payer: 59 | Source: Ambulatory Visit | Attending: Neurology | Admitting: Neurology

## 2015-05-15 DIAGNOSIS — R29810 Facial weakness: Secondary | ICD-10-CM

## 2015-05-15 DIAGNOSIS — G5139 Clonic hemifacial spasm, unspecified: Secondary | ICD-10-CM

## 2015-05-15 MED ORDER — GADOBENATE DIMEGLUMINE 529 MG/ML IV SOLN
20.0000 mL | Freq: Once | INTRAVENOUS | Status: AC | PRN
  Administered 2015-05-15: 20 mL via INTRAVENOUS

## 2015-05-16 ENCOUNTER — Encounter: Payer: Self-pay | Admitting: Neurology

## 2015-05-16 ENCOUNTER — Telehealth: Payer: Self-pay | Admitting: Neurology

## 2015-05-16 NOTE — Telephone Encounter (Signed)
Tried to call, but number busy. Mychart message sent.

## 2015-05-16 NOTE — Telephone Encounter (Signed)
-----   Message from Octaviano Battyebecca S Tat, DO sent at 05/16/2015 10:40 AM EDT ----- Have pt schedule appt to review her MRI brain (before botox)

## 2015-05-16 NOTE — Telephone Encounter (Signed)
Appt made with patient.  

## 2015-05-31 ENCOUNTER — Encounter: Payer: Self-pay | Admitting: Neurology

## 2015-05-31 ENCOUNTER — Encounter: Payer: Self-pay | Admitting: Gynecology

## 2015-05-31 ENCOUNTER — Ambulatory Visit (INDEPENDENT_AMBULATORY_CARE_PROVIDER_SITE_OTHER): Payer: 59 | Admitting: Neurology

## 2015-05-31 ENCOUNTER — Ambulatory Visit (INDEPENDENT_AMBULATORY_CARE_PROVIDER_SITE_OTHER): Payer: 59 | Admitting: Gynecology

## 2015-05-31 ENCOUNTER — Other Ambulatory Visit (HOSPITAL_COMMUNITY)
Admission: RE | Admit: 2015-05-31 | Discharge: 2015-05-31 | Disposition: A | Discharge status: Home / Self Care | Payer: 59 | Source: Ambulatory Visit | Attending: Gynecology | Admitting: Gynecology

## 2015-05-31 ENCOUNTER — Telehealth: Payer: Self-pay | Admitting: Neurology

## 2015-05-31 VITALS — BP 124/80 | HR 95 | Ht 64.0 in | Wt 223.0 lb

## 2015-05-31 VITALS — BP 138/88 | Ht 64.0 in | Wt 243.0 lb

## 2015-05-31 DIAGNOSIS — Z01419 Encounter for gynecological examination (general) (routine) without abnormal findings: Secondary | ICD-10-CM

## 2015-05-31 DIAGNOSIS — Z01411 Encounter for gynecological examination (general) (routine) with abnormal findings: Secondary | ICD-10-CM | POA: Insufficient documentation

## 2015-05-31 DIAGNOSIS — Z1151 Encounter for screening for human papillomavirus (HPV): Secondary | ICD-10-CM | POA: Insufficient documentation

## 2015-05-31 DIAGNOSIS — N8110 Cystocele, unspecified: Secondary | ICD-10-CM

## 2015-05-31 DIAGNOSIS — Z124 Encounter for screening for malignant neoplasm of cervix: Secondary | ICD-10-CM

## 2015-05-31 DIAGNOSIS — G518 Other disorders of facial nerve: Secondary | ICD-10-CM | POA: Diagnosis not present

## 2015-05-31 DIAGNOSIS — R9402 Abnormal brain scan: Secondary | ICD-10-CM

## 2015-05-31 DIAGNOSIS — N816 Rectocele: Secondary | ICD-10-CM | POA: Diagnosis not present

## 2015-05-31 DIAGNOSIS — IMO0002 Reserved for concepts with insufficient information to code with codable children: Secondary | ICD-10-CM

## 2015-05-31 DIAGNOSIS — G5139 Clonic hemifacial spasm, unspecified: Secondary | ICD-10-CM

## 2015-05-31 NOTE — Progress Notes (Signed)
Janet Case was seen today in neurologic consultation at the request of Piedad ClimesMartin, William Cody, PA-C.  The consultation is for the evaluation of L hemifacial spasm.  She is accompanied by her daughter who supplements the history. The records that were made available to me were reviewed.  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  Last botox was many years ago, over 10 years.  She states that she got botox for about a year and it was helpful without SE.  She states that an ENT did the botox.  She stated that she was on topamax in the past and it helped some so it was restarted on 04/10/15 at 50 mg bid.  She was on a higher dosage in the past (up to 300mg ).  She has also been on gabapentin (sleep walking was SE).  She states that her L eye will wink but doesn't close as much as it did in the past.  Lack of sleep and stress will make it worse.  Never happens on the right side of the face.  States that it has actually improved a bit over the years.  05/31/15 update:  The patient returns today for follow-up, accompanied by her daughters supplements the history.  Patient has a history of left hemifacial spasm, and fairly significant left facial asymmetry.  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  ALLERGIES:  No Known Allergies  CURRENT MEDICATIONS:  Outpatient Encounter Prescriptions as of 05/31/2015  Medication Sig  . amLODipine (NORVASC) 10 MG tablet Take 10 mg by mouth daily.  Marland Kitchen. topiramate (TOPAMAX) 50 MG tablet Take 1 tablet (50 mg total) by mouth 2 (two) times daily.  . valsartan-hydrochlorothiazide (DIOVAN HCT) 160-25 MG tablet Take 1 tablet by mouth daily.  Marland Kitchen. zolpidem (AMBIEN) 10 MG tablet Take 1 tablet (10 mg total) by mouth at bedtime as needed for sleep.  .  [DISCONTINUED] Pseudoephedrine-Guaifenesin (MUCINEX D PO) Take by mouth as needed.    No facility-administered encounter medications on file as of 05/31/2015.    PAST MEDICAL HISTORY:   Past Medical History  Diagnosis Date  . Hypertension   . Muscle spasms of head and/or neck   . Clonic hemifacial spasm     Left Facial  . Colon polyps   . Depression   . Anxiety     PAST SURGICAL HISTORY:   Past Surgical History  Procedure Laterality Date  . Tubal ligation    . Polypectomy      Oral  . Vaginal wound closure / repair  1977  . Wisdom tooth extraction      SOCIAL HISTORY:   Social History   Social History  . Marital Status: Single    Spouse Name: N/A  . Number of Children: 2  . Years of Education: N/A   Occupational History  . Customer Service Representative     [employer]   Social History Main Topics  . Smoking status: Never Smoker   . Smokeless tobacco: Not on file  . Alcohol Use: No  . Drug Use: No  . Sexual Activity: Not on file   Other Topics Concern  . Not on file   Social History Narrative    FAMILY HISTORY:   Family Status  Relation Status Death Age  .  Mother Deceased   . Father Deceased     ROS:   No lateralizing weakness or paresthesias except right lateral thigh.  A complete 10 system review of systems was obtained and was unremarkable apart from what is mentioned above.  PHYSICAL EXAMINATION:    VITALS:   Filed Vitals:   05/31/15 0824  BP: 124/80  Pulse: 95  Height:  (1.626 m)  Weight: 223 lb (101.152 kg)    GEN:  Normal appears female in no acute distress.  Appears stated age. HEENT:  Normocephalic, atraumatic. The mucous membranes are moist. The superficial temporal arteries are without ropiness or tenderness. Cardiovascular: Regular rate and rhythm. Lungs: Clear to auscultation bilaterally. Neck/Heme: There are no carotid bruits noted bilaterally.  NEUROLOGICAL: Orientation:  The patient is alert and oriented x 3.  Fund of  knowledge is appropriate.  Recent and remote memory intact.  Attention span and concentration normal.  Repeats and names without difficulty. Cranial nerves: There is decreased forehead wrinkle on the L.  Extraocular muscles are intact and visual fields are full to confrontational testing. Speech is fluent and clear. Soft palate rises symmetrically and there is no tongue deviation. Hearing is intact to conversational tone. Tone: Tone is good throughout. Sensation: Sensation is intact to light touch and pinprick throughout (facial, trunk, extremities). Vibration is intact at the bilateral big toe. There is no extinction with double simultaneous stimulation. There is no sensory dermatomal level identified. Coordination:  The patient has no difficulty with RAM's or FNF bilaterally. Motor: Strength is 5/5 in the bilateral upper and lower extremities.  Shoulder shrug is equal and symmetric. There is no pronator drift.  There are no fasciculations noted. Gait and Station: The patient is able to ambulate without difficulty. The patient is able to heel toe walk without any difficulty. The patient is able to ambulate in a tandem fashion. The patient is able to stand in the Romberg position. Abnormal Movements:  There is L eye twitch.  There is pulling of the L face over the nasalis, zygomaticus and around the orbicularis oris as well.  It can become clonic and sustained at times.   IMPRESSION/PLAN  1. L hemifacial spasm  -As above, hemifacial spasm is a result of neurovascular compression from a tortuous distal left vertebral.  I am unsure if this will be amenable to any therapy given the posterior location.  Regardless, I think that we need an opinion from an endovascular surgeon.  The patient was agreeable.  I will refer her to Dr. Tonia Brooms  -I will likely still botox her but want to see what neurosurgeon says  -She will continue on the Topamax, 50 mg twice a day.  She does think that has been somewhat  beneficial. 2.  Right meralgia paresthetica  -describes sx's well.  Hers is intermittent and not very bothersome.  Weight loss could be of value 3.  F/u with me after she sees Dr. Tonia Brooms

## 2015-05-31 NOTE — Patient Instructions (Signed)
Kegel Exercises The goal of Kegel exercises is to isolate and exercise your pelvic floor muscles. These muscles act as a hammock that supports the rectum, vagina, small intestine, and uterus. As the muscles weaken, the hammock sags and these organs are displaced from their normal positions. Kegel exercises can strengthen your pelvic floor muscles and help you to improve bladder and bowel control, improve sexual response, and help reduce many problems and some discomfort during pregnancy. Kegel exercises can be done anywhere and at any time. HOW TO PERFORM KEGEL EXERCISES 1. Locate your pelvic floor muscles. To do this, squeeze (contract) the muscles that you use when you try to stop the flow of urine. You will feel a tightness in the vaginal area (women) and a tight lift in the rectal area (men and women). 2. When you begin, contract your pelvic muscles tight for 2-5 seconds, then relax them for 2-5 seconds. This is one set. Do 4-5 sets with a short pause in between. 3. Contract your pelvic muscles for 8-10 seconds, then relax them for 8-10 seconds. Do 4-5 sets. If you cannot contract your pelvic muscles for 8-10 seconds, try 5-7 seconds and work your way up to 8-10 seconds. Your goal is 4-5 sets of 10 contractions each day. Keep your stomach, buttocks, and legs relaxed during the exercises. Perform sets of both short and long contractions. Vary your positions. Perform these contractions 3-4 times per day. Perform sets while you are:   Lying in bed in the morning.  Standing at lunch.  Sitting in the late afternoon.  Lying in bed at night. You should do 40-50 contractions per day. Do not perform more Kegel exercises per day than recommended. Overexercising can cause muscle fatigue. Continue these exercises for for at least 15-20 weeks or as directed by your caregiver.   This information is not intended to replace advice given to you by your health care provider. Make sure you discuss any questions  you have with your health care provider.   Document Released: 06/15/2012 Document Revised: 07/20/2014 Document Reviewed: 06/15/2012 Elsevier Interactive Patient Education 2016 [employer]. About Rectocele  Overview  A rectocele is a type of hernia which causes different degrees of bulging of the rectal tissues into the vaginal wall.  You may even notice that it presses against the vaginal wall so much that some vaginal tissues droop outside of the opening of your vagina.  Causes of Rectocele  The most common cause is childbirth.  The muscles and ligaments in the pelvis that hold up and support the female organs and vagina become stretched and weakened during labor and delivery.  The more babies you have, the more the support tissues are stretched and weakened.  Not everyone who has a baby will develop a rectocele.  Some women have stronger supporting tissue in the pelvis and may not have as much of a problem as others.  Women who have a Cesarean section usually do not get rectocele's unless they pushed a long time prior to the cesarean delivery.  Other conditions that can cause a rectocele include chronic constipation, a chronic cough, a lot of heavy lifting, and obesity.  Older women may have this problem because the loss of female hormones causes the vaginal tissue to become weaker.  Symptoms  There may not be any symptoms.  If you do have symptoms, they may include:  Pelvic pressure in the rectal area  Protrusion of the lower part of the vagina through the opening of the  vagina  Constipation and trapping of the stool, making it difficult to have a bowel movement.  In severe cases, you may have to press on the lower part of your vagina to help push the stool out of you rectum.  This is called splinting to empty.  Diagnosing Rectocele  Your health care provider will ask about your symptoms and perform a pelvic exam.  S/he will ask you to bear down, pushing like you are having a bowel  movement so as to see how far the lower part of the vagina protrudes into the vagina and possible outside of the vagina.  Your provider will also ask you to contract the muscles of your pelvis (like you are stopping the stream in the middle of urinating) to determine the strength of your pelvic muscles.  Your provider may also do a rectal exam.  Treatment Options  If you do not have any symptoms, no treatment may be necessary.  Other treatment options include:  Pelvic floor exercises: Contracting the muscles in your genital area may help strengthen your muscles and support the organs.  Be sure to get proper exercise instruction from you physical therapist.  A pessary (removealbe pelvic support device) sometimes helps rectocele symptoms.  Surgery: Surgical repair may be necessary. In some cases the uterus may need to be taken out ( a hysterectomy) as well.  There are many types of surgery for pelvic support problems.  Look for physicians who specialize in repair procedures.  You can take care of yourself by:  Treating and preventing constipation  Avoiding heavy lifting, and lifting correctly (with your legs, not with you waist or back)  Treating a chronic cough or bronchitis  Not smoking  avoiding too much weight gain  Doing pelvic floor exercises   2007, [employer] Doc.33About Cystocele  Overview  The pelvic organs, including the bladder, are normally supported by pelvic floor muscles and ligaments.  When these muscles and ligaments are stretched, weakened or torn, the wall between the bladder and the vagina sags or herniates causing a prolapse, sometimes called a cystocele.  This condition may cause discomfort and problems with emptying the bladder.  It can be present in various stages.  Some people are not aware of the changes.  Others may notice changes at the vaginal opening or a feeling of the bladder dropping outside the body.  Causes of a Cystocele  A  cystocele is usually caused by muscle straining or stretching during childbirth.  In addition, cystocele is more common after menopause, because the hormone estrogen helps keep the elastic tissues around the pelvic organs strong.  A cystocele is more likely to occur when levels of estrogen decrease.  Other causes include: heavy lifting, chronic coughing, previous pelvic surgery and obesity.  Symptoms  A bladder that has dropped from its normal position may cause: unwanted urine leakage (stress incontinence), frequent urination or urge to urinate, incomplete emptying of the bladder (not feeling bladder relief after emptying), pain or discomfort in the vagina, pelvis, groin, lower back or lower abdomen and frequent urinary tract infections.  Mild cases may not cause any symptoms.  Treatment Options  Pelvic floor (Kegel) exercises:  Strength training the muscles in your genital area  Behavioral changes: Treating and preventing constipation, taking time to empty your bladder properly, learning to lift properly and/or avoid heavy lifting when possible, stopping smoking, avoiding weight gain and treating a chronic cough or bronchitis.  A pessary: A vaginal support device is sometimes used to  help pelvic support caused by muscle and ligament changes.  Surgery: Surgical repair may be necessary if symptoms cannot be managed with exercise, behavioral changes and a pessary.  Surgery is usually considered for severe cases.   2007, [employer]

## 2015-05-31 NOTE — Telephone Encounter (Signed)
Referral faxed to [facility] at [phone] with confirmation received. They will contact the patient to schedule.  

## 2015-05-31 NOTE — Progress Notes (Addendum)
   Patient is a 54 year old gravida 3 para 3 (normal spontaneous vaginal delivery) that was referred to our practice as a courtesy of her PCP  Daphine DeutscherMartin. Patient recently had her annual exam with him and her Pap smear came back insufficient material to analyze so she's here not only for Pap smear but also questionable uterine prolapse. Patient sometimes states that she feels some mild pressure but has no urinary incontinence and no difficulty with bowel movements with the exception of occasional constipation. She reports no past history of any abnormal Pap smear. They were able to obtain a GC and Chlamydia culture and trichomoniasis evaluation which were negative on recent attempted Pap smear. Patient denies smoking. She has worked in the past with lifting patient's as a CMA. Patient on no hormone replacement therapy  Exam: Abdomen slightly pendulous soft nontender no rebound or guarding Pelvic: Bartholin urethra Skene was within normal limits Vagina: First-degree cystocele and first-degree rectocele no vaginal lesions were noted Cervix: No lesions or discharge Uterus: Anteverted normal size shape and consistency Adnexa: No palpable masses or tenderness Rectal vaginal exam was done in the right position no enterocele was noted  Anal wink good neurological response A urethrovesical angle analysis was done in the following manner: A sterile applicator impregnated with 1% lidocaine gel was applied through the urethra to the urethrovesical junction. Patient was asked to bear down as well as to cough the urethrovesical angle was less than 30 which is considered normal no evidence of urethral hypermobility.  The size examined the patient in supine position she was examined on the erect position to analyze for possibility of uterine descensus/prolapse. Patient had good support a first-degree cystocele and first-degree rectocele was noted.  Both in the supine and erect position patient was asked to cough and  there down no evidence of urinary incontinence.  Assessment/plan: #1 Pap smear done today with HPV screen #2 first-degree cystocele, first-degree rectocele, no uterine prolapse or descensus. Patient was provided with literature information on pelvic relaxation and supporting structures and possible treatment down the line. Surgical intervention not needed to this time. We discussed potential scenarios that in the event that in the near future if she were to experience lots of pressure whether or not she has urinary incontinence will consider surgical intervention which can include the following: Transvaginal hysterectomy with anterior and posterior colporrhaphy along with bilateral salpingo-oophorectomy. Or the use of pessaries. I am going to provided with literature information on Kegel exercises and will continue to monitor. A copy of this note will be sent to her PCP  Daphine DeutscherMartin.

## 2015-05-31 NOTE — Patient Instructions (Addendum)
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 They will call you directly to set up an appointment date and time. If you do not hear from them they can be contacted directly at [phone]. We will call you after your appt to set up a Botox date.

## 2015-06-04 LAB — CYTOLOGY - PAP

## 2015-06-11 ENCOUNTER — Encounter: Payer: Self-pay | Admitting: Gynecology

## 2015-06-17 ENCOUNTER — Other Ambulatory Visit: Payer: Self-pay | Admitting: Physician Assistant

## 2015-07-03 ENCOUNTER — Telehealth: Payer: Self-pay | Admitting: Neurology

## 2015-07-03 NOTE — Telephone Encounter (Signed)
Botox has been approved. Must be shipped from [facility]. They will contact patient about copay and ship Botox once patient pays.

## 2015-07-19 ENCOUNTER — Encounter: Payer: Self-pay | Admitting: Physician Assistant

## 2015-07-19 MED ORDER — AMLODIPINE BESYLATE 10 MG PO TABS: 10.0000 mg | ORAL_TABLET | Freq: Every day | ORAL | Status: DC

## 2015-07-19 MED ORDER — TRAZODONE HCL 50 MG PO TABS: 25.0000 mg | ORAL_TABLET | Freq: Every evening | ORAL | Status: DC | PRN

## 2015-07-19 MED FILL — AMLODIPINE BESYLATE 10 MG T: 10 | 30 days supply | Qty: 30 | Fill #0

## 2015-07-19 MED FILL — ZOLPIDEM TARTRATE 10 MG TAB: 10 | 15 days supply | Qty: 15 | Fill #1

## 2015-07-19 MED FILL — traZODone HCL 50 MG TABS: 50 | 90 days supply | Qty: 45 | Fill #0

## 2015-07-30 ENCOUNTER — Encounter: Payer: Self-pay | Admitting: Physician Assistant

## 2015-07-30 DIAGNOSIS — F411 Generalized anxiety disorder: Secondary | ICD-10-CM

## 2015-08-06 ENCOUNTER — Encounter: Payer: Self-pay | Admitting: Physician Assistant

## 2015-08-18 ENCOUNTER — Encounter: Payer: Self-pay | Admitting: Physician Assistant

## 2015-08-23 ENCOUNTER — Encounter: Payer: Self-pay | Admitting: Physician Assistant

## 2015-08-23 ENCOUNTER — Ambulatory Visit (INDEPENDENT_AMBULATORY_CARE_PROVIDER_SITE_OTHER): Payer: 59 | Admitting: Physician Assistant

## 2015-08-23 ENCOUNTER — Telehealth: Payer: Self-pay | Admitting: Physician Assistant

## 2015-08-23 VITALS — BP 148/107 | HR 96 | Temp 97.8°F | Ht 64.0 in | Wt 224.2 lb

## 2015-08-23 DIAGNOSIS — G4710 Hypersomnia, unspecified: Secondary | ICD-10-CM

## 2015-08-23 MED ORDER — ZOLPIDEM TARTRATE ER 6.25 MG PO TBCR: 6.2500 mg | EXTENDED_RELEASE_TABLET | Freq: Every evening | ORAL | Status: DC | PRN

## 2015-08-23 MED FILL — AMLODIPINE BESYLATE 10 MG T: 10 | 30 days supply | Qty: 30 | Fill #1

## 2015-08-23 MED FILL — TOPIRAMATE 50 MG TABLET: 50 | 30 days supply | Qty: 60 | Fill #1

## 2015-08-23 MED FILL — ZOLPIDEM TARTRATE 5 MG TAB: 5 | 30 days supply | Qty: 30 | Fill #0

## 2015-08-23 MED FILL — VALSARTAN-HCTZ 160-25 MG TA: 160-25 | 30 days supply | Qty: 30 | Fill #2

## 2015-08-23 NOTE — Progress Notes (Signed)
Patient presents to clinic today for follow-up of insomnia after starting the Trazodone. Is taking as directed but only getting 2-3 hours of sleep per night. Patient was previously on Ambien XR which worked well for her. Is waiting on appointment with Pulmonology to schedule a sleep study. Denies new or worsening symptoms.   Past Medical History  Diagnosis Date  . Hypertension   . Muscle spasms of head and/or neck   . Clonic hemifacial spasm     Left Facial  . Colon polyps   . Depression   . Anxiety     Current Outpatient Prescriptions on File Prior to Visit  Medication Sig Dispense Refill  . amLODipine (NORVASC) 10 MG tablet Take 1 tablet (10 mg total) by mouth daily. 30 tablet 1  . topiramate (TOPAMAX) 50 MG tablet TAKE 1 TABLET BY MOUTH TWICE DAILY 60 tablet 3  . valsartan-hydrochlorothiazide (DIOVAN HCT) 160-25 MG tablet Take 1 tablet by mouth daily. 30 tablet 3   No current facility-administered medications on file prior to visit.    No Known Allergies  Family History  Problem Relation Age of Onset  . Hyperlipidemia Mother 63    Deceased  . Heart disease Mother   . Congestive Heart Failure Mother   . Hypertension Mother   . Hypertension Sister   . Kidney disease Mother   . Kidney disease Father 44    Deceased  . Diabetes Mother   . Diabetes Sister   . Breast cancer Sister   . Depression Sister   . Anxiety disorder Sister   . Dementia Mother   . Thyroid cancer Sister     Thyroid Removed  . Heart defect Mother   . Heart attack Father   . Leukemia Maternal Aunt   . Allergies Daughter   . GI problems Daughter     Social History   Social History  . Marital Status: Single    Spouse Name: N/A  . Number of Children: 2  . Years of Education: N/A   Occupational History  . Customer Service Representative     [employer]   Social History Main Topics  . Smoking status: Never Smoker   . Smokeless tobacco: None  . Alcohol Use: No  . Drug Use: No  . Sexual  Activity: No   Other Topics Concern  . None   Social History Narrative   Review of Systems - See HPI.  All other ROS are negative.  BP 148/107 mmHg  Pulse 96  Temp(Src) 97.8 F (36.6 C) (Oral)  Ht  (1.626 m)  Wt 224 lb 3.2 oz (101.696 kg)  BMI 38.46 kg/m2  SpO2 99%  Physical Exam  Constitutional: She is oriented to person, place, and time and well-developed, well-nourished, and in no distress.  HENT:  Head: Normocephalic and atraumatic.  Cardiovascular: Normal rate, regular rhythm, normal heart sounds and intact distal pulses.   Pulmonary/Chest: Effort normal and breath sounds normal. No respiratory distress. She has no wheezes. She has no rales. She exhibits no tenderness.  Neurological: She is alert and oriented to person, place, and time.  Skin: Skin is warm and dry. No rash noted.  Psychiatric: Affect normal.  Vitals reviewed.  Recent Results (from the past 2160 hour(s))  Cytology - PAP     Status: None   Collection Time: 05/31/15 12:00 AM  Result Value Ref Range   CYTOLOGY - PAP PAP RESULT     Assessment/Plan: Excessive somnolence disorder Will resend referral to Pulmonology for  assessment and sleep study. Patient agrees to follow-up on referral this time. For insomnia will stop TraZodone and begin Ambien XR.  Follow-up 1 month.

## 2015-08-23 NOTE — Telephone Encounter (Signed)
Phoned in new prescription to pharmacy.//AB/CMA

## 2015-08-23 NOTE — Telephone Encounter (Signed)
Ok to use regular ambien at 5 mg -- take at bedtime. Quantity 30 with 0 refills

## 2015-08-23 NOTE — Patient Instructions (Signed)
Please start the Ambien XR nightly as directed. Stop the Trazodone.  You will be contacted for assessment by Pulmonary.  Follow-up with me in 1 month.

## 2015-08-23 NOTE — Telephone Encounter (Signed)
[facility] - [place], [place] - [address] [phone] (Phone) [phone] (Fax)        Reason for call:  As per pharmacy zolpidem (AMBIEN CR) 6.25 MG CR tablet will cost patient $100 pharmacy would like to know if they can use an alternate. Patient is currently at the pharmacy

## 2015-08-23 NOTE — Assessment & Plan Note (Signed)
Will resend referral to Pulmonology for assessment and sleep study. Patient agrees to follow-up on referral this time. For insomnia will stop TraZodone and begin Ambien XR.  Follow-up 1 month.

## 2015-08-23 NOTE — Progress Notes (Signed)
Pre visit review using our clinic review tool, if applicable. No additional management support is needed unless otherwise documented below in the visit note. 

## 2015-09-03 ENCOUNTER — Encounter: Payer: Self-pay | Admitting: Physician Assistant

## 2015-09-16 ENCOUNTER — Encounter: Payer: Self-pay | Admitting: Physician Assistant

## 2015-09-20 ENCOUNTER — Encounter: Payer: Self-pay | Admitting: Physician Assistant

## 2015-09-20 ENCOUNTER — Ambulatory Visit (INDEPENDENT_AMBULATORY_CARE_PROVIDER_SITE_OTHER): Payer: 59 | Admitting: Physician Assistant

## 2015-09-20 ENCOUNTER — Other Ambulatory Visit: Payer: Self-pay | Admitting: Physician Assistant

## 2015-09-20 VITALS — BP 120/90 | HR 105 | Temp 98.1°F | Ht 64.0 in | Wt 217.4 lb

## 2015-09-20 DIAGNOSIS — F418 Other specified anxiety disorders: Secondary | ICD-10-CM

## 2015-09-20 DIAGNOSIS — F419 Anxiety disorder, unspecified: Principal | ICD-10-CM

## 2015-09-20 DIAGNOSIS — F329 Major depressive disorder, single episode, unspecified: Secondary | ICD-10-CM

## 2015-09-20 DIAGNOSIS — F32A Depression, unspecified: Secondary | ICD-10-CM

## 2015-09-20 MED ORDER — SERTRALINE HCL 25 MG PO TABS: 25.0000 mg | ORAL_TABLET | Freq: Every day | ORAL | Status: DC

## 2015-09-20 MED FILL — SERTRALINE HCL 25 MG TABLET: 25 | 30 days supply | Qty: 30 | Fill #0

## 2015-09-20 NOTE — Progress Notes (Signed)
Pre visit review using our clinic review tool, if applicable. No additional management support is needed unless otherwise documented below in the visit note. 

## 2015-09-20 NOTE — Patient Instructions (Signed)
Please continue Topamax as directed. Start the Sertraline (Zoloft) nightly as directed for anxiety. This will also hopefully help with sleep.   I will call once I have finished your FMLA paperwork. Follow-up with me in 3-4 weeks.

## 2015-09-20 NOTE — Progress Notes (Signed)
Patient presents to clinic today c/o worsening anxiety related to stressors at work. Works for [employer] and notes the requirements have sky rocketed. Feels rushed and stressed on a daily basis which is affecting focus, sleep and appetite. Endorses some depressed mood. Denies SI/HI or panic attack.  Past Medical History  Diagnosis Date  . Hypertension   . Muscle spasms of head and/or neck   . Clonic hemifacial spasm     Left Facial  . Colon polyps   . Depression   . Anxiety     Current Outpatient Prescriptions on File Prior to Visit  Medication Sig Dispense Refill  . amLODipine (NORVASC) 10 MG tablet Take 1 tablet (10 mg total) by mouth daily. 30 tablet 1  . topiramate (TOPAMAX) 50 MG tablet TAKE 1 TABLET BY MOUTH TWICE DAILY 60 tablet 3  . valsartan-hydrochlorothiazide (DIOVAN HCT) 160-25 MG tablet Take 1 tablet by mouth daily. 30 tablet 3  . zolpidem (AMBIEN CR) 6.25 MG CR tablet Take 1 tablet (6.25 mg total) by mouth at bedtime as needed for sleep. 30 tablet 0   No current facility-administered medications on file prior to visit.    No Known Allergies  Family History  Problem Relation Age of Onset  . Hyperlipidemia Mother 2571    Deceased  . Heart disease Mother   . Congestive Heart Failure Mother   . Hypertension Mother   . Hypertension Sister   . Kidney disease Mother   . Kidney disease Father 2380    Deceased  . Diabetes Mother   . Diabetes Sister   . Breast cancer Sister   . Depression Sister   . Anxiety disorder Sister   . Dementia Mother   . Thyroid cancer Sister     Thyroid Removed  . Heart defect Mother   . Heart attack Father   . Leukemia Maternal Aunt   . Allergies Daughter   . GI problems Daughter     Social History   Social History  . Marital Status: Single    Spouse Name: N/A  . Number of Children: 2  . Years of Education: N/A   Occupational History  . Customer Service Representative     [employer]   Social History Main  Topics  . Smoking status: Never Smoker   . Smokeless tobacco: None  . Alcohol Use: No  . Drug Use: No  . Sexual Activity: No   Other Topics Concern  . None   Social History Narrative   Review of Systems - See HPI.  All other ROS are negative.  BP 120/90 mmHg  Pulse 105  Temp(Src) 98.1 F (36.7 C) (Oral)  Ht 5\' 4"  (1.626 m)  Wt 217 lb 6.4 oz (98.612 kg)  BMI 37.30 kg/m2  SpO2 99%  Physical Exam  Constitutional: She is oriented to person, place, and time and well-developed, well-nourished, and in no distress.  HENT:  Head: Normocephalic and atraumatic.  Eyes: Conjunctivae are normal.  Cardiovascular: Normal rate, regular rhythm, normal heart sounds and intact distal pulses.   Pulmonary/Chest: Effort normal and breath sounds normal. No respiratory distress. She has no wheezes. She has no rales. She exhibits no tenderness.  Neurological: She is alert and oriented to person, place, and time.  Skin: Skin is warm and dry. No rash noted.  Psychiatric: Affect normal.  Vitals reviewed.   No results found for this or any previous visit (from the past 2160 hour(s)).  Assessment/Plan: Anxiety and depression Mainly due to  significant work stressors. No alarm symptoms. Handout on our counselors given. Encouraged patient to schedule an appointment. Will begin Sertraline 25 mg QPM. Follow-up 1 month. Will call patient once FMLA is complete.

## 2015-09-21 DIAGNOSIS — F329 Major depressive disorder, single episode, unspecified: Secondary | ICD-10-CM | POA: Insufficient documentation

## 2015-09-21 DIAGNOSIS — F419 Anxiety disorder, unspecified: Principal | ICD-10-CM

## 2015-09-21 DIAGNOSIS — F32A Depression, unspecified: Secondary | ICD-10-CM | POA: Insufficient documentation

## 2015-09-21 NOTE — Assessment & Plan Note (Signed)
Mainly due to significant work stressors. No alarm symptoms. Handout on our counselors given. Encouraged patient to schedule an appointment. Will begin Sertraline 25 mg QPM. Follow-up 1 month. Will call patient once FMLA is complete.

## 2015-09-23 ENCOUNTER — Telehealth: Payer: Self-pay | Admitting: *Deleted

## 2015-09-23 MED ORDER — ZOLPIDEM TARTRATE ER 6.25 MG PO TBCR: 6.2500 mg | EXTENDED_RELEASE_TABLET | Freq: Every evening | ORAL | Status: DC | PRN

## 2015-09-23 NOTE — Telephone Encounter (Signed)
Received FMLA/Disability paperwork from provider [Cody] completed; faxed to [facility] at [phone], [facility] with contact name and number for return call RE: if pt wants to p/u paperwork and/or have it mailed/SLS 03/13

## 2015-10-01 ENCOUNTER — Telehealth: Payer: Self-pay | Admitting: *Deleted

## 2015-10-01 MED FILL — ZOLPIDEM TARTRATE 5 MG TAB: 5 | 30 days supply | Qty: 30 | Fill #0

## 2015-10-01 NOTE — Telephone Encounter (Signed)
Received fax requesting PA for Zolpidem ER 6.25mg ; Initiated via Cover My Meds, awaiting response/SLS 03/21 Faxed temporary supply of Zolpidem 5mg  to [facility] pharmacy, while awaiting PA response for ER 6.25mg /SLS

## 2015-10-02 NOTE — Telephone Encounter (Signed)
Received Denial from [employer] stating "[employer] cannot perform this PA request because the requested product is a plan exclusion for this member, so there is no coverage criteria to review and apply"; provider informed/SLS [phone]

## 2015-10-14 ENCOUNTER — Other Ambulatory Visit: Payer: Self-pay | Admitting: Physician Assistant

## 2015-10-14 MED FILL — TOPIRAMATE 50 MG TABLET: 50 | 30 days supply | Qty: 60 | Fill #2

## 2015-10-14 MED FILL — SERTRALINE HCL 25 MG TABLET: 25 | 30 days supply | Qty: 30 | Fill #1

## 2015-10-14 MED FILL — VALSARTAN-HCTZ 160-25 MG TA: 160-25 | 30 days supply | Qty: 30 | Fill #3

## 2015-10-14 MED FILL — AMLODIPINE BESYLATE 10 MG T: 10 | 30 days supply | Qty: 30 | Fill #0

## 2015-10-15 ENCOUNTER — Encounter: Payer: Self-pay | Admitting: Physician Assistant

## 2015-10-25 ENCOUNTER — Ambulatory Visit (HOSPITAL_COMMUNITY): Payer: 59 | Admitting: Psychiatry

## 2015-10-31 ENCOUNTER — Other Ambulatory Visit: Payer: Self-pay | Admitting: Physician Assistant

## 2015-11-01 MED FILL — ZOLPIDEM TARTRATE 5 MG TAB: 5 | 30 days supply | Qty: 30 | Fill #0

## 2015-11-01 NOTE — Telephone Encounter (Signed)
Insurance would not cover PA on 10/01/15 for Ambien 6.25mg  CR; was changed back to Ambien 5mg , gave pharmacy Vo for #30 with 1 refill/SLS 04/21

## 2015-11-12 MED FILL — SERTRALINE HCL 25 MG TABLET: 25 | 30 days supply | Qty: 30 | Fill #2

## 2015-11-19 ENCOUNTER — Other Ambulatory Visit: Payer: Self-pay | Admitting: Physician Assistant

## 2015-11-19 ENCOUNTER — Ambulatory Visit (HOSPITAL_COMMUNITY): Payer: 59 | Admitting: Psychiatry

## 2015-11-19 MED FILL — AMLODIPINE BESYLATE 10 MG T: 10 | 30 days supply | Qty: 30 | Fill #1

## 2015-11-19 MED FILL — TOPIRAMATE 50 MG TABLET: 50 | 30 days supply | Qty: 60 | Fill #3

## 2015-11-19 MED FILL — VALSARTAN-HCTZ 160-25 MG TA: 160-25 | 30 days supply | Qty: 30 | Fill #0

## 2015-11-20 ENCOUNTER — Encounter: Payer: Self-pay | Admitting: Physician Assistant

## 2015-12-04 MED FILL — ZOLPIDEM TARTRATE 5 MG TAB: 5 | 30 days supply | Qty: 30 | Fill #1

## 2015-12-06 ENCOUNTER — Ambulatory Visit: Payer: 59 | Admitting: Physician Assistant

## 2015-12-11 ENCOUNTER — Encounter: Payer: Self-pay | Admitting: Physician Assistant

## 2015-12-11 ENCOUNTER — Encounter: Payer: Self-pay | Admitting: Internal Medicine

## 2015-12-11 ENCOUNTER — Ambulatory Visit (INDEPENDENT_AMBULATORY_CARE_PROVIDER_SITE_OTHER): Payer: 59 | Admitting: Internal Medicine

## 2015-12-11 VITALS — BP 100/82 | HR 82

## 2015-12-11 DIAGNOSIS — G4700 Insomnia, unspecified: Secondary | ICD-10-CM

## 2015-12-11 DIAGNOSIS — G4733 Obstructive sleep apnea (adult) (pediatric): Secondary | ICD-10-CM | POA: Diagnosis not present

## 2015-12-11 DIAGNOSIS — G4710 Hypersomnia, unspecified: Secondary | ICD-10-CM | POA: Diagnosis not present

## 2015-12-11 NOTE — Progress Notes (Signed)
12/11/2015- 55 year old female never smoker FOLLOW FOR:  not able to sleep without sleep aid and not able to stay asleep; snoring.  has never had sleep study.  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 Daughter did live with her and told her of snoring, but patient now lives alone. Occasional light caffeine. Occasional daytime sleepiness after work but no opportunity to nap at work. Ambien helps initiate sleep but she still wakes during the night 3 times or more. Previously failed trazodone. Normal thyroid function. Treated for hypertension. ENT surgery-none.  Prior to Admission medications   Medication Sig Start Date End Date Taking? Authorizing Provider  amLODipine (NORVASC) 10 MG tablet TAKE 1 TABLET (10 MG TOTAL) BY MOUTH DAILY. 10/14/15  Yes Waldon Merl, PA-C  sertraline (ZOLOFT) 25 MG tablet Take 1 tablet (25 mg total) by mouth at bedtime. 09/20/15  Yes Waldon Merl, PA-C  topiramate (TOPAMAX) 50 MG tablet TAKE 1 TABLET BY MOUTH TWICE DAILY 06/17/15  Yes Waldon Merl, PA-C  valsartan-hydrochlorothiazide (DIOVAN-HCT) 160-25 MG tablet TAKE 1 TABLET BY MOUTH DAILY. 11/19/15  Yes Waldon Merl, PA-C  zolpidem (AMBIEN) 5 MG tablet TAKE 1 TABLET BY MOUTH AT BEDTIME AS NEEDED 11/01/15  Yes Waldon Merl, PA-C   Past Medical History  Diagnosis Date  . Hypertension   . Muscle spasms of head and/or neck   . Clonic hemifacial spasm     Left Facial  . Colon polyps   . Depression   . Anxiety    Past Surgical History  Procedure Laterality Date  . Tubal ligation    . Polypectomy      Oral  . Vaginal wound closure / repair  1977  . Wisdom tooth extraction     Family History  Problem Relation Age of Onset  . Hyperlipidemia Mother 33    Deceased  . Heart disease Mother   . Congestive Heart Failure Mother   . Hypertension Mother   .  Hypertension Sister   . Kidney disease Mother   . Kidney disease Father 36    Deceased  . Diabetes Mother   . Diabetes Sister   . Breast cancer Sister   . Depression Sister   . Anxiety disorder Sister   . Dementia Mother   . Thyroid cancer Sister     Thyroid Removed  . Heart defect Mother   . Heart attack Father   . Leukemia Maternal Aunt   . Allergies Daughter   . GI problems Daughter    Social History   Social History  . Marital Status: Single    Spouse Name: N/A  . Number of Children: 2  . Years of Education: N/A   Occupational History  . Customer Service Representative     [employer]   Social History Main Topics  . Smoking status: Never Smoker   . Smokeless tobacco: Not on file  . Alcohol Use: No  . Drug Use: No  . Sexual Activity: No   Other Topics Concern  . Not on file   Social History Narrative   ROS-see HPI   Negative unless "+" Constitutional:    weight loss, night sweats, fevers, chills, fatigue, lassitude. HEENT:    headaches, difficulty swallowing, tooth/dental problems, sore throat,       sneezing, itching, ear ache, nasal congestion, post nasal  drip, snoring CV:    chest pain, orthopnea, PND, swelling in lower extremities, anasarca,                                                        dizziness, palpitations Resp:   shortness of breath with exertion or at rest.                productive cough,   non-productive cough, coughing up of blood.              change in color of mucus.  wheezing.   Skin:    rash or lesions. GI:  No-   heartburn, indigestion, abdominal pain, nausea, vomiting, diarrhea,                 change in bowel habits, loss of appetite GU: dysuria, change in color of urine, no urgency or frequency.   flank pain. MS:   joint pain, stiffness, decreased range of motion, back pain. Neuro-     nothing unusual Psych:  change in mood or affect.  depression or anxiety.   memory loss.  OBJ- Physical Exam General- Alert, Oriented,  Affect-appropriate, Distress- none acute, + Overweight Skin- rash-none, lesions- none, excoriation- none Lymphadenopathy- none Head- atraumatic            Eyes- Gross vision intact, PERRLA, conjunctivae and secretions clear            Ears- Hearing, canals-normal            Nose- Clear, no-Septal dev, mucus, polyps, erosion, perforation             Throat- Mallampati III , mucosa clear , drainage- none, tonsils- atrophic, + own teeth  Neck- flexible , trachea midline, no stridor , thyroid nl, carotid no bruit Chest - symmetrical excursion , unlabored           Heart/CV- RRR , no murmur , no gallop  , no rub, nl s1 s2                           - JVD- none , edema- none, stasis changes- none, varices- none           Lung- clear to P&A, wheeze- none, cough- none , dullness-none, rub- none           Chest wall-  Abd-  Br/ Gen/ Rectal- Not done, not indicated Extrem- cyanosis- none, clubbing, none, atrophy- none, strength- nl Neuro- + blepharospasm left eye

## 2015-12-11 NOTE — Patient Instructions (Signed)
Order- schedule sleep study-  HST or split NPSG       Dx OSA  We will schedule you back to follow up after your sleep study

## 2015-12-11 NOTE — Assessment & Plan Note (Signed)
Insomnia secondary to lifestyle-job stress seems to be the dominant issue. Plan-basic discussion of sleep hygiene. She can continue with Ambien until we exclude underlying sleep problems

## 2015-12-11 NOTE — Assessment & Plan Note (Signed)
She is not emphasizing excessive somnolence at today's visit. Physical exam and history would be consistent with underlying obstructive sleep apnea. This needs to be better defined before we decide how to manage her complaints of difficulty initiating and maintaining sleep/insomnia. Plan-sleep study then return for follow-up.

## 2015-12-17 ENCOUNTER — Encounter: Payer: Self-pay | Admitting: Physician Assistant

## 2015-12-17 ENCOUNTER — Other Ambulatory Visit (HOSPITAL_COMMUNITY)
Admission: RE | Admit: 2015-12-17 | Discharge: 2015-12-17 | Disposition: A | Discharge status: Home / Self Care | Payer: 59 | Source: Ambulatory Visit | Attending: Physician Assistant | Admitting: Physician Assistant

## 2015-12-17 ENCOUNTER — Ambulatory Visit (INDEPENDENT_AMBULATORY_CARE_PROVIDER_SITE_OTHER): Payer: 59 | Admitting: Physician Assistant

## 2015-12-17 VITALS — BP 119/74 | HR 85 | Temp 98.0°F | Resp 16 | Ht 64.0 in | Wt 213.1 lb

## 2015-12-17 DIAGNOSIS — N760 Acute vaginitis: Secondary | ICD-10-CM | POA: Insufficient documentation

## 2015-12-17 DIAGNOSIS — E049 Nontoxic goiter, unspecified: Secondary | ICD-10-CM

## 2015-12-17 DIAGNOSIS — Z1211 Encounter for screening for malignant neoplasm of colon: Secondary | ICD-10-CM

## 2015-12-17 DIAGNOSIS — Z113 Encounter for screening for infections with a predominantly sexual mode of transmission: Secondary | ICD-10-CM

## 2015-12-17 DIAGNOSIS — R202 Paresthesia of skin: Secondary | ICD-10-CM

## 2015-12-17 DIAGNOSIS — E010 Iodine-deficiency related diffuse (endemic) goiter: Secondary | ICD-10-CM

## 2015-12-17 LAB — COMPREHENSIVE METABOLIC PANEL
ALK PHOS: 98 U/L (ref 39–117)
ALT: 18 U/L (ref 0–35)
AST: 21 U/L (ref 0–37)
Albumin: 4.6 g/dL (ref 3.5–5.2)
BILIRUBIN TOTAL: 0.4 mg/dL (ref 0.2–1.2)
BUN: 14 mg/dL (ref 6–23)
CALCIUM: 9.8 mg/dL (ref 8.4–10.5)
CO2: 28 meq/L (ref 19–32)
CREATININE: 0.94 mg/dL (ref 0.40–1.20)
Chloride: 107 mEq/L (ref 96–112)
GFR: 78.95 mL/min (ref >60.00)
GLUCOSE: 111 mg/dL — AB (ref 70–99)
Potassium: 3.3 mEq/L — ABNORMAL LOW (ref 3.5–5.1)
Sodium: 142 mEq/L (ref 135–145)
TOTAL PROTEIN: 7.3 g/dL (ref 6.0–8.3)

## 2015-12-17 LAB — CBC
HCT: 42.7 % (ref 36.0–46.0)
HEMOGLOBIN: 14.1 g/dL (ref 12.0–15.0)
MCHC: 33.1 g/dL (ref 30.0–36.0)
MCV: 87.9 fl (ref 78.0–100.0)
PLATELETS: 251 10*3/uL (ref 150.0–400.0)
RBC: 4.86 Mil/uL (ref 3.87–5.11)
RDW: 15.4 % (ref 11.5–15.5)
WBC: 5.9 10*3/uL (ref 4.0–10.5)

## 2015-12-17 LAB — HEMOGLOBIN A1C: Hgb A1c MFr Bld: 5.9 % (ref 4.6–6.5)

## 2015-12-17 LAB — TSH: TSH: 1.62 u[IU]/mL (ref 0.35–4.50)

## 2015-12-17 LAB — VITAMIN B12: VITAMIN B 12: 403 pg/mL (ref 211–911)

## 2015-12-17 LAB — SEDIMENTATION RATE: SED RATE: 12 mm/h (ref 0–30)

## 2015-12-17 LAB — T4, FREE: FREE T4: 0.65 ng/dL (ref 0.60–1.60)

## 2015-12-17 LAB — FERRITIN: Ferritin: 53.9 ng/mL (ref 10.0–291.0)

## 2015-12-17 MED FILL — SERTRALINE HCL 25 MG TABLET: 25 | 30 days supply | Qty: 30 | Fill #3

## 2015-12-17 NOTE — Progress Notes (Signed)
Pre visit review using our clinic review tool, if applicable. No additional management support is needed unless otherwise documented below in the visit note/SLS  

## 2015-12-17 NOTE — Patient Instructions (Signed)
Please go to the lab for blood work.  I will call you with your results. Please continue chronic medications.  You will be contacted for an US of your thyroid.   Please start a multivitamin daily. Turn the thermostat down a couple of degrees before bedtime.  We will treat based on results.

## 2015-12-18 LAB — URINE CYTOLOGY ANCILLARY ONLY: Trichomonas: NEGATIVE

## 2015-12-18 LAB — RPR

## 2015-12-18 LAB — HIV ANTIBODY (ROUTINE TESTING W REFLEX): HIV: NONREACTIVE

## 2015-12-19 ENCOUNTER — Encounter: Payer: Self-pay | Admitting: Physician Assistant

## 2015-12-19 LAB — GC/CHLAMYDIA PROBE AMP
CT Probe RNA: NOT DETECTED
GC Probe RNA: NOT DETECTED

## 2015-12-19 LAB — URINE CYTOLOGY ANCILLARY ONLY
Bacterial vaginitis: NEGATIVE
Candida vaginitis: NEGATIVE

## 2015-12-19 LAB — HSV(HERPES SMPLX)ABS-I+II(IGG+IGM)-BLD
HSV 1 Glycoprotein G Ab, IgG: <0.9 Index (ref <0.90)
HSV 2 GLYCOPROTEIN G AB, IGG: 7.11 {index} — AB (ref <0.90)
Herpes Simplex Vrs I&II-IgM Ab (EIA): 1.37 INDEX — ABNORMAL HIGH

## 2015-12-20 ENCOUNTER — Ambulatory Visit (HOSPITAL_BASED_OUTPATIENT_CLINIC_OR_DEPARTMENT_OTHER): Payer: 59

## 2015-12-21 NOTE — Progress Notes (Signed)
Patient presents to clinic today with multiple concerns.  Patient is due for colonoscopy as she has never had. Denies family history of colorectal cancer. Denies change in bowel habits, melena, hematochezia, unexplainable weight loss or fevers.  Is ready to have screening performed. Requesting order.  Patient also wishes to have routine HIV screen. Previously sexually active with males only. Has not had any interaction in some time. Denies concern for HIV or STI. Would like complete routine STI panel as she states she has never had.   Patient also notes some hot flashes at night with sweating of her posterior neck only. Denies true night sweats. Endorses some tingling in hands and feet. Notes mild decreased appetite, constipation and dry skin. Has noted some hair loss but states this is very mild. Patient is s/p hysterectomy > 10 years ago. Denies personal history of hypothyroidism. Does note family history of thyroid abnormality. Denies recent change to medications or starting OTC supplements. Has history of anxiety and depression, currently on Zoloft. Endorses doing well on regimen. Does not feel symptoms are related to anxiety.    Past Medical History  Diagnosis Date  . Hypertension   . Muscle spasms of head and/or neck   . Clonic hemifacial spasm     Left Facial  . Colon polyps   . Depression   . Anxiety     Current Outpatient Prescriptions on File Prior to Visit  Medication Sig Dispense Refill  . amLODipine (NORVASC) 10 MG tablet TAKE 1 TABLET (10 MG TOTAL) BY MOUTH DAILY. 30 tablet 5  . sertraline (ZOLOFT) 25 MG tablet Take 1 tablet (25 mg total) by mouth at bedtime. 30 tablet 3  . topiramate (TOPAMAX) 50 MG tablet TAKE 1 TABLET BY MOUTH TWICE DAILY 60 tablet 3  . valsartan-hydrochlorothiazide (DIOVAN-HCT) 160-25 MG tablet TAKE 1 TABLET BY MOUTH DAILY. 30 tablet 0  . zolpidem (AMBIEN) 5 MG tablet TAKE 1 TABLET BY MOUTH AT BEDTIME AS NEEDED 30 tablet 1   No current  facility-administered medications on file prior to visit.    Allergies  Allergen Reactions  . Influenza Vaccines Nausea And Vomiting    Family History  Problem Relation Age of Onset  . Hyperlipidemia Mother 61    Deceased  . Heart disease Mother   . Congestive Heart Failure Mother   . Hypertension Mother   . Hypertension Sister   . Kidney disease Mother   . Kidney disease Father 63    Deceased  . Diabetes Mother   . Diabetes Sister   . Breast cancer Sister   . Depression Sister   . Anxiety disorder Sister   . Dementia Mother   . Thyroid cancer Sister     Thyroid Removed  . Heart defect Mother   . Heart attack Father   . Leukemia Maternal Aunt   . Allergies Daughter   . GI problems Daughter     Social History   Social History  . Marital Status: Divorced    Spouse Name: N/A  . Number of Children: 2  . Years of Education: N/A   Occupational History  . Customer Service Representative     [employer]   Social History Main Topics  . Smoking status: Never Smoker   . Smokeless tobacco: None  . Alcohol Use: No  . Drug Use: No  . Sexual Activity: No   Other Topics Concern  . None   Social History Narrative   Review of Systems - See HPI.  All other  ROS are negative.  BP 119/74 mmHg  Pulse 85  Temp(Src) 98 F (36.7 C) (Oral)  Resp 16  Ht '5\' 4"'$  (1.626 m)  Wt 213 lb 2 oz (96.673 kg)  BMI 36.56 kg/m2  SpO2 99%  Physical Exam  Constitutional: She is oriented to person, place, and time and well-developed, well-nourished, and in no distress.  HENT:  Head: Normocephalic and atraumatic.  Right Ear: External ear normal.  Left Ear: External ear normal.  Nose: Nose normal.  Mouth/Throat: Oropharynx is clear and moist. No oropharyngeal exudate.  TM within normal limits bilaterally.  Neck: Trachea normal and normal range of motion. Carotid bruit is not present. Thyromegaly present. No thyroid mass present.  Cardiovascular: Normal rate, regular rhythm, normal heart  sounds and intact distal pulses.   Pulmonary/Chest: Effort normal and breath sounds normal. No respiratory distress. She has no wheezes. She has no rales. She exhibits no tenderness.  Abdominal: Soft. Bowel sounds are normal.  Neurological: She is alert and oriented to person, place, and time. She has normal sensation, normal strength and intact cranial nerves.  + left facial twitches noted on exam that are chronic for patient.   Skin: Skin is warm and dry. No rash noted.  Psychiatric: Affect normal.  Vitals reviewed.   Recent Results (from the past 2160 hour(s))  Urine cytology ancillary only     Status: None   Collection Time: 12/17/15 12:00 AM  Result Value Ref Range   Trichomonas Negative     Comment: Normal Reference Range - Negative  Urine cytology ancillary only     Status: None   Collection Time: 12/17/15 12:00 AM  Result Value Ref Range   Bacterial vaginitis Negative for Bacterial Vaginitis Microorganisms     Comment: Normal Reference Range - Negative   Candida vaginitis Negative for Candida Vaginitis Microorganisms     Comment: Normal Reference Range - Negative  HIV antibody     Status: None   Collection Time: 12/17/15 10:33 AM  Result Value Ref Range   HIV 1&2 Ab, 4th Generation NONREACTIVE NONREACTIVE    Comment:   HIV-1 antigen and HIV-1/HIV-2 antibodies were not detected.  There is no laboratory evidence of HIV infection.   HIV-1/2 Antibody Diff        Not indicated. HIV-1 RNA, Qual TMA          Not indicated.     PLEASE NOTE: This information has been disclosed to you from records whose confidentiality may be protected by state law. If your state requires such protection, then the state law prohibits you from making any further disclosure of the information without the specific written consent of the person to whom it pertains, or as otherwise permitted by law. A general authorization for the release of medical or other information is NOT sufficient for this  purpose.   The performance of this assay has not been clinically validated in patients less than 36 years old.   For additional information please refer to http://education.[facility].com/faq/FAQ106.  (This link is being provided for informational/educational purposes only.)     HSV(herpes smplx)abs-1+2(IgG+IgM)-bld     Status: Abnormal   Collection Time: 12/17/15 10:33 AM  Result Value Ref Range   HSV 1 Glycoprotein G Ab, IgG <0.90 <0.90 Index    Comment:                      Index             Interpretation                    =====             ==============                    <  0.90               NEGATIVE                    0.90 - 1.09          EQUIVOCAL                    > 1.09               POSITIVE    HSV 2 Glycoprotein G Ab, IgG 7.11 (H) <0.90 Index    Comment:                      Index             Interpretation                    =====             ==============                    < 0.90               NEGATIVE                    0.90 - 1.09          EQUIVOCAL                    > 1.09               POSITIVE This assay utilizes recombinant type-specific antigens to differentiate HSV-1 from HSV-2 infections. A positive result cannot distinguish between recent and past infection. If recent HSV infection is suspected but the results are negative or equivocal, the assay should be repeated in 4-6 weeks. The performance characteristics of the assay have not been established for pediatric populations, immunocompromised patients, or neonatal screening.      Herpes Simplex Vrs I&II-IgM Ab (EIA) 1.37 (H) INDEX    Comment:                 <=0.90 . . . . . . Marland Kitchen NEGATIVE               0.91-1.09. . . . . Marland Kitchen EQUIVOCAL               >=1.10 . . . . . . Marland Kitchen POSITIVE                                                                                                   The results obtained with the HSV 1 & 2 IgM test should serve only as an aid to diagnosis and should not be interpreted  as diagnostic by themselves. Heterotypic IgM antibody responses may occur in patients with a history of infection with other Herpes viruses, including Epstein-Barr virus and Varicella zoster virus, and give false positive results in HSV 1 & 2 IgM tests.  This test does not distinguish between HSV 1 and HSV 2.  A positive HSV  IgM may indicate primary infection, but IgM antibody can persist 12 or more months after primary infection.  For confirmation, if clinically indicated, positive IgM results could be followed with the test for HSV 1 & 2 IgG glycoprotein (test code [id number]) in 4-6 weeks.   RPR     Status: None   Collection Time: 12/17/15 10:33 AM  Result Value Ref Range   RPR Ser Ql NON REAC NON REAC  CBC     Status: None   Collection Time: 12/17/15 10:33 AM  Result Value Ref Range   WBC 5.9 4.0 - 10.5 K/uL   RBC 4.86 3.87 - 5.11 Mil/uL   Platelets 251.0 150.0 - 400.0 K/uL   Hemoglobin 14.1 12.0 - 15.0 g/dL   HCT 42.7 36.0 - 46.0 %   MCV 87.9 78.0 - 100.0 fl   MCHC 33.1 30.0 - 36.0 g/dL   RDW 15.4 11.5 - 15.5 %  Comp Met (CMET)     Status: Abnormal   Collection Time: 12/17/15 10:33 AM  Result Value Ref Range   Sodium 142 135 - 145 mEq/L   Potassium 3.3 (L) 3.5 - 5.1 mEq/L   Chloride 107 96 - 112 mEq/L   CO2 28 19 - 32 mEq/L   Glucose, Bld 111 (H) 70 - 99 mg/dL   BUN 14 6 - 23 mg/dL   Creatinine, Ser 0.94 0.40 - 1.20 mg/dL   Total Bilirubin 0.4 0.2 - 1.2 mg/dL   Alkaline Phosphatase 98 39 - 117 U/L   AST 21 0 - 37 U/L   ALT 18 0 - 35 U/L   Total Protein 7.3 6.0 - 8.3 g/dL   Albumin 4.6 3.5 - 5.2 g/dL   Calcium 9.8 8.4 - 10.5 mg/dL   GFR 78.95 >60.00 mL/min  TSH     Status: None   Collection Time: 12/17/15 10:33 AM  Result Value Ref Range   TSH 1.62 0.35 - 4.50 uIU/mL  T4, free     Status: None   Collection Time: 12/17/15 10:33 AM  Result Value Ref Range   Free T4 0.65 0.60 - 1.60 ng/dL  Sed Rate (ESR)     Status: None   Collection Time: 12/17/15 10:33 AM  Result  Value Ref Range   Sed Rate 12 0 - 30 mm/hr  Ferritin     Status: None   Collection Time: 12/17/15 10:33 AM  Result Value Ref Range   Ferritin 53.9 10.0 - 291.0 ng/mL  B12     Status: None   Collection Time: 12/17/15 10:33 AM  Result Value Ref Range   Vitamin B-12 403 211 - 911 pg/mL  Hemoglobin A1c     Status: None   Collection Time: 12/17/15 10:33 AM  Result Value Ref Range   Hgb A1c MFr Bld 5.9 4.6 - 6.5 %    Comment: Glycemic Control Guidelines for People with Diabetes:Non Diabetic:  <6%Goal of Therapy: <7%Additional Action Suggested:  >8%   GC/Chlamydia Probe Amp     Status: None   Collection Time: 12/17/15 10:33 AM  Result Value Ref Range   CT Probe RNA NOT DETECTED     Comment:                    **Normal Reference Range: NOT DETECTED**   This test was performed using the APTIMA COMBO2 Assay ([employer]   The analytical performance characteristics of this assay, when used to test SurePath specimens have been determined by [employer]  GC Probe RNA NOT DETECTED     Comment:                    **Normal Reference Range: NOT DETECTED**   This test was performed using the APTIMA COMBO2 Assay ([employer]   The analytical performance characteristics of this assay, when used to test SurePath specimens have been determined by [facility]       Assessment/Plan: 1. Colon cancer screening Referral to GI for screening colonoscopy placed. - Ambulatory referral to Gastroenterology  2. Screening for STD (sexually transmitted disease) Asymptomatic. No concerns. Will check panel today.  - HIV antibody - HSV(herpes smplx)abs-1+2(IgG+IgM)-bld - RPR - GC/chlamydia probe amp, urine - Urine cytology ancillary only  3. Paresthesia of both hands Unclear etiology. Examination unremarkable for sensory loss. Extremities are all neurovascularly intact. Already with history of left hemifacial spasms. Followed by Neurology. Continue current regimen. Will  check following labs today to assess further. Recommend patient follow-up with Neurology for this as well. - CBC - Comp Met (CMET) - Sed Rate (ESR) - Ferritin - B12 - Hemoglobin A1c  4. Thyromegaly Noted on exam. Will check TSH, T4 (free) and obtain US thyroid to assess further. - TSH - T4, free - US Soft Tissue Head/Neck; Future  Leeanne Rio, PA-C

## 2015-12-24 ENCOUNTER — Encounter: Payer: Self-pay | Admitting: Physician Assistant

## 2015-12-30 ENCOUNTER — Ambulatory Visit (HOSPITAL_BASED_OUTPATIENT_CLINIC_OR_DEPARTMENT_OTHER)
Admission: RE | Admit: 2015-12-30 | Discharge: 2015-12-30 | Disposition: A | Discharge status: Home / Self Care | Payer: 59 | Source: Ambulatory Visit | Attending: Physician Assistant | Admitting: Physician Assistant

## 2015-12-30 DIAGNOSIS — E042 Nontoxic multinodular goiter: Secondary | ICD-10-CM | POA: Insufficient documentation

## 2015-12-30 DIAGNOSIS — E010 Iodine-deficiency related diffuse (endemic) goiter: Secondary | ICD-10-CM

## 2015-12-30 DIAGNOSIS — E049 Nontoxic goiter, unspecified: Secondary | ICD-10-CM | POA: Diagnosis present

## 2016-01-01 ENCOUNTER — Telehealth: Payer: Self-pay | Admitting: *Deleted

## 2016-01-01 ENCOUNTER — Encounter: Payer: Self-pay | Admitting: Physician Assistant

## 2016-01-01 DIAGNOSIS — E041 Nontoxic single thyroid nodule: Secondary | ICD-10-CM

## 2016-01-01 DIAGNOSIS — E010 Iodine-deficiency related diffuse (endemic) goiter: Secondary | ICD-10-CM

## 2016-01-01 NOTE — Telephone Encounter (Signed)
Patient informed, understood & agreed to ENT referral; Referral placed to ENT, provider informed/SLS 06/21

## 2016-01-01 NOTE — Telephone Encounter (Signed)
-----   Message from Waldon MerlWilliam C Martin, PA-C sent at 12/31/2015  9:07 PM EDT ----- US reveals enlargement of thyroid with nodules since last US. Recommend ENT assessment to determine if further biopsy is warranted. Ok to place referral if she is willing.

## 2016-01-02 DIAGNOSIS — G4733 Obstructive sleep apnea (adult) (pediatric): Secondary | ICD-10-CM | POA: Diagnosis not present

## 2016-01-03 ENCOUNTER — Other Ambulatory Visit: Payer: Self-pay | Admitting: Physician Assistant

## 2016-01-03 ENCOUNTER — Ambulatory Visit (HOSPITAL_COMMUNITY): Payer: 59 | Admitting: Psychiatry

## 2016-01-03 ENCOUNTER — Other Ambulatory Visit: Payer: Self-pay | Admitting: *Deleted

## 2016-01-03 MED ORDER — ZOLPIDEM TARTRATE 5 MG PO TABS: 5.0000 mg | ORAL_TABLET | Freq: Every evening | ORAL | Status: DC | PRN

## 2016-01-03 MED FILL — ZOLPIDEM TARTRATE 5 MG TAB: 5 | 30 days supply | Qty: 30 | Fill #0

## 2016-01-03 MED FILL — TOPIRAMATE 50 MG TABLET: 50 | 30 days supply | Qty: 60 | Fill #0

## 2016-01-03 NOTE — Progress Notes (Signed)
Rx faxed to pharmacy/SLS 06/23 

## 2016-01-03 NOTE — Telephone Encounter (Signed)
Rx request to pharmacy/SLS  

## 2016-01-07 ENCOUNTER — Encounter: Payer: Self-pay | Admitting: Physician Assistant

## 2016-01-07 DIAGNOSIS — G4733 Obstructive sleep apnea (adult) (pediatric): Secondary | ICD-10-CM | POA: Diagnosis not present

## 2016-01-09 ENCOUNTER — Other Ambulatory Visit: Payer: Self-pay | Admitting: *Deleted

## 2016-01-09 DIAGNOSIS — G4733 Obstructive sleep apnea (adult) (pediatric): Secondary | ICD-10-CM

## 2016-01-16 ENCOUNTER — Encounter: Payer: Self-pay | Admitting: Physician Assistant

## 2016-01-21 ENCOUNTER — Other Ambulatory Visit: Payer: Self-pay | Admitting: Physician Assistant

## 2016-01-21 MED FILL — SERTRALINE HCL 25 MG TABLET: 25 | 30 days supply | Qty: 30 | Fill #0

## 2016-01-21 NOTE — Telephone Encounter (Signed)
Refill sent per [facility] refill protocol/SLS  

## 2016-01-23 ENCOUNTER — Encounter: Payer: Self-pay | Admitting: Internal Medicine

## 2016-01-23 ENCOUNTER — Ambulatory Visit (INDEPENDENT_AMBULATORY_CARE_PROVIDER_SITE_OTHER): Payer: 59 | Admitting: Internal Medicine

## 2016-01-23 VITALS — BP 112/64 | HR 92 | Ht 64.0 in | Wt 212.6 lb

## 2016-01-23 DIAGNOSIS — G4733 Obstructive sleep apnea (adult) (pediatric): Secondary | ICD-10-CM | POA: Diagnosis not present

## 2016-01-23 DIAGNOSIS — G4700 Insomnia, unspecified: Secondary | ICD-10-CM | POA: Diagnosis not present

## 2016-01-23 MED ORDER — ESZOPICLONE 3 MG PO TABS: 3.0000 mg | ORAL_TABLET | Freq: Every day | ORAL | Status: DC

## 2016-01-23 NOTE — Patient Instructions (Signed)
Script printed for lunesta 3 mg tabs     Try 1 at bedtime for sleep as needed  For the snoring and minimal obstructive sleep apnea- Look at drug stores for a mouth piece for snoring, or a chin strap for snoring

## 2016-01-23 NOTE — Progress Notes (Signed)
12/11/2015- 55 year old female never smoker FOLLOW FOR:  not able to sleep without sleep aid and not able to stay asleep; snoring.  has never had sleep study.  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 Daughter did live with her and told her of snoring, but patient now lives alone. Occasional light caffeine. Occasional daytime sleepiness after work but no opportunity to nap at work. Ambien helps initiate sleep but she still wakes during the night 3 times or more. Previously failed trazodone. Normal thyroid function. Treated for hypertension. ENT surgery-none.  01/23/2016-55 year old female never smoker followed for sleep problems Unattended Home SLEEP Test 01/02/2016- mild OSA, AHI 6.6/hour, desaturation to 81%, body weight 213 pounds. FOLLOWS FOR: Review HST with patient. We discussed symptoms and question of whether to treat minimal obstructive sleep apnea. Discussed importance of weight control and good sleep habits. Her primary complaint is difficulty initiating and maintaining sleep-insomnia.  ROS-see HPI   Negative unless "+" Constitutional:    weight loss, night sweats, fevers, chills, fatigue, lassitude. HEENT:    headaches, difficulty swallowing, tooth/dental problems, sore throat,       sneezing, itching, ear ache, nasal congestion, post nasal drip, snoring CV:    chest pain, orthopnea, PND, swelling in lower extremities, anasarca,                                                        dizziness, palpitations Resp:   shortness of breath with exertion or at rest.                productive cough,   non-productive cough, coughing up of blood.              change in color of mucus.  wheezing.   Skin:    rash or lesions. GI:  No-   heartburn, indigestion, abdominal pain, nausea, vomiting, diarrhea,                 change in bowel habits, loss of  appetite GU: dysuria, change in color of urine, no urgency or frequency.   flank pain. MS:   joint pain, stiffness, decreased range of motion, back pain. Neuro-     nothing unusual Psych:  change in mood or affect.  depression or anxiety.   memory loss.  OBJ- Physical Exam General- Alert, Oriented, Affect-appropriate, Distress- none acute, + Overweight Skin- rash-none, lesions- none, excoriation- none Lymphadenopathy- none Head- atraumatic            Eyes- Gross vision intact, PERRLA, conjunctivae and secretions clear            Ears- Hearing, canals-normal            Nose- Clear, no-Septal dev, mucus, polyps, erosion, perforation             Throat- Mallampati III , mucosa clear , drainage- none, tonsils- atrophic, + own teeth  Neck- flexible , trachea midline, no stridor , thyroid nl, carotid no bruit Chest - symmetrical excursion , unlabored           Heart/CV- RRR , no murmur , no gallop  , no rub, nl s1 s2                           -  JVD- none , edema- none, stasis changes- none, varices- none           Lung- clear to P&A, wheeze- none, cough- none , dullness-none, rub- none           Chest wall-  Abd-  Br/ Gen/ Rectal- Not done, not indicated Extrem- cyanosis- none, clubbing, none, atrophy- none, strength- nl Neuro- + blepharospasm left eye

## 2016-01-24 ENCOUNTER — Encounter: Payer: Self-pay | Admitting: Physician Assistant

## 2016-01-24 MED FILL — ESZOPICLONE 3 MG TABLET: 3 | 30 days supply | Qty: 30 | Fill #0

## 2016-01-25 DIAGNOSIS — G4733 Obstructive sleep apnea (adult) (pediatric): Secondary | ICD-10-CM | POA: Insufficient documentation

## 2016-01-25 NOTE — Assessment & Plan Note (Signed)
Emphasized basic importance of good sleep hygiene, realistic expectations, use of naps. Diona FantiPlan-try Lunesta with appropriate discussion

## 2016-01-25 NOTE — Assessment & Plan Note (Signed)
Discussed management of very mild obstructive sleep apnea including the importance of weight control, her responsibility for safe driving, conservative measures including sleep off flat of back, chin strap, untreated mouthpiece.

## 2016-01-29 ENCOUNTER — Encounter: Payer: Self-pay | Admitting: Physician Assistant

## 2016-02-03 DIAGNOSIS — G5139 Clonic hemifacial spasm, unspecified: Secondary | ICD-10-CM | POA: Insufficient documentation

## 2016-02-03 DIAGNOSIS — E041 Nontoxic single thyroid nodule: Secondary | ICD-10-CM | POA: Insufficient documentation

## 2016-02-06 ENCOUNTER — Other Ambulatory Visit: Payer: Self-pay | Admitting: Otolaryngology

## 2016-02-07 DIAGNOSIS — Z7689 Persons encountering health services in other specified circumstances: Secondary | ICD-10-CM

## 2016-02-08 ENCOUNTER — Other Ambulatory Visit: Payer: Self-pay | Admitting: Otolaryngology

## 2016-02-08 DIAGNOSIS — E041 Nontoxic single thyroid nodule: Secondary | ICD-10-CM

## 2016-02-10 ENCOUNTER — Other Ambulatory Visit: Payer: Self-pay | Admitting: Otolaryngology

## 2016-02-10 DIAGNOSIS — E041 Nontoxic single thyroid nodule: Secondary | ICD-10-CM

## 2016-02-11 ENCOUNTER — Encounter: Payer: Self-pay | Admitting: Physician Assistant

## 2016-02-12 ENCOUNTER — Telehealth: Payer: Self-pay

## 2016-02-12 NOTE — Telephone Encounter (Signed)
Called patient to inform her that FMLA papers have been faxed to Company and to advise we have copy here for her. Left message on answering machine for return call.

## 2016-02-12 NOTE — Telephone Encounter (Signed)
Called patient to inform her that her FMLA documents had been faxed an a copy will be at front desk for pick up. Unable to leave message at this time.

## 2016-02-14 ENCOUNTER — Encounter: Payer: Self-pay | Admitting: Physician Assistant

## 2016-02-17 ENCOUNTER — Encounter: Payer: Self-pay | Admitting: Physician Assistant

## 2016-02-21 ENCOUNTER — Ambulatory Visit (HOSPITAL_COMMUNITY): Payer: 59 | Admitting: Psychiatry

## 2016-02-26 ENCOUNTER — Ambulatory Visit
Admission: RE | Admit: 2016-02-26 | Discharge: 2016-02-26 | Disposition: A | Discharge status: Home / Self Care | Payer: 59 | Source: Ambulatory Visit | Attending: Otolaryngology | Admitting: Otolaryngology

## 2016-02-26 ENCOUNTER — Other Ambulatory Visit (HOSPITAL_COMMUNITY)
Admission: RE | Admit: 2016-02-26 | Discharge: 2016-02-26 | Disposition: A | Discharge status: Home / Self Care | Payer: 59 | Source: Ambulatory Visit | Attending: Radiology | Admitting: Radiology

## 2016-02-26 DIAGNOSIS — E041 Nontoxic single thyroid nodule: Secondary | ICD-10-CM | POA: Insufficient documentation

## 2016-03-03 ENCOUNTER — Encounter: Payer: Self-pay | Admitting: Physician Assistant

## 2016-03-11 ENCOUNTER — Other Ambulatory Visit: Payer: Self-pay | Admitting: Physician Assistant

## 2016-03-11 MED FILL — VALSARTAN-HCTZ 160-25 MG TA: 160-25 | 30 days supply | Qty: 30 | Fill #0

## 2016-03-11 MED FILL — AMLODIPINE BESYLATE 10 MG T: 10 | 30 days supply | Qty: 30 | Fill #2

## 2016-03-11 MED FILL — ESZOPICLONE 3 MG TABLET: 3 | 30 days supply | Qty: 30 | Fill #1

## 2016-03-11 MED FILL — SERTRALINE HCL 25 MG TABLET: 25 | 30 days supply | Qty: 30 | Fill #1

## 2016-03-11 MED FILL — TOPIRAMATE 50 MG TABLET: 50 | 30 days supply | Qty: 60 | Fill #1

## 2016-03-18 ENCOUNTER — Encounter: Payer: Self-pay | Admitting: Physician Assistant

## 2016-03-31 ENCOUNTER — Encounter: Payer: Self-pay | Admitting: Physician Assistant

## 2016-04-03 ENCOUNTER — Ambulatory Visit: Payer: Self-pay | Admitting: Physician Assistant

## 2016-04-17 MED FILL — ESZOPICLONE 3 MG TABLET: 3 | 30 days supply | Qty: 30 | Fill #2

## 2016-04-17 MED FILL — SERTRALINE HCL 25 MG TABLET: 25 | 30 days supply | Qty: 30 | Fill #2

## 2016-04-17 MED FILL — TOPIRAMATE 50 MG TABLET: 50 | 30 days supply | Qty: 60 | Fill #2

## 2016-05-13 ENCOUNTER — Encounter: Payer: Self-pay | Admitting: Internal Medicine

## 2016-05-13 NOTE — Telephone Encounter (Signed)
CY - please advise. Thanks! 

## 2016-05-13 NOTE — Telephone Encounter (Signed)
Sleep medications tend to lose effectiveness if used every night. Try to skip lunesta for awhile. Can use otc Zzquil instead if needed.

## 2016-05-19 MED FILL — TOPIRAMATE 50 MG TABLET: 50 | 30 days supply | Qty: 60 | Fill #3

## 2016-05-19 MED FILL — ESZOPICLONE 3 MG TABLET: 3 | 30 days supply | Qty: 30 | Fill #3

## 2016-05-19 MED FILL — traZODone HCL 50 MG TABS: 50 | 90 days supply | Qty: 45 | Fill #1

## 2016-05-19 MED FILL — SERTRALINE HCL 25 MG TABLET: 25 | 30 days supply | Qty: 30 | Fill #3

## 2016-06-30 ENCOUNTER — Other Ambulatory Visit: Payer: Self-pay | Admitting: Physician Assistant

## 2016-06-30 MED FILL — TOPIRAMATE 50 MG TABLET: 50 | 30 days supply | Qty: 60 | Fill #0

## 2016-06-30 MED FILL — SERTRALINE HCL 25 MG TABLET: 25 | 30 days supply | Qty: 30 | Fill #0

## 2016-06-30 MED FILL — ESZOPICLONE 3 MG TABLET: 3 | 30 days supply | Qty: 30 | Fill #4

## 2016-06-30 NOTE — Telephone Encounter (Signed)
Medication filled to pharmacy as requested.   

## 2016-08-03 ENCOUNTER — Encounter: Payer: Self-pay | Admitting: Physician Assistant

## 2016-08-03 MED FILL — AMLODIPINE BESYLATE 10 MG T: 10 | 30 days supply | Qty: 30 | Fill #3

## 2016-08-03 MED FILL — VALSARTAN-HCTZ 160-25 MG TA: 160-25 | 30 days supply | Qty: 30 | Fill #1

## 2016-08-03 MED FILL — SERTRALINE HCL 25 MG TABLET: 25 | 30 days supply | Qty: 30 | Fill #1

## 2016-08-10 MED FILL — TOPIRAMATE 50 MG TABLET: 50 | 30 days supply | Qty: 60 | Fill #1

## 2016-08-11 ENCOUNTER — Encounter: Payer: Self-pay | Admitting: Internal Medicine

## 2016-08-12 ENCOUNTER — Other Ambulatory Visit: Payer: Self-pay | Admitting: Internal Medicine

## 2016-08-12 MED ORDER — ESZOPICLONE 3 MG PO TABS
3.0000 mg | ORAL_TABLET | Freq: Every day | ORAL | 5 refills | Status: DC
Start: 2016-08-12 — End: 2016-09-16

## 2016-08-12 MED FILL — ESZOPICLONE 3 MG TABLET: 3 | 30 days supply | Qty: 30 | Fill #0

## 2016-08-12 NOTE — Telephone Encounter (Signed)
May give # 30, 1 for sleep as needed, with 2 refills. No further refills after that until seen by one of our sleep doctors.

## 2016-08-12 NOTE — Telephone Encounter (Signed)
cdy- please advise if okay to refill lunesta 3 mg  Last ov 01/23/16 and rx given same day 01/23/16 # 30 with 5 rf  No ov pending

## 2016-08-12 NOTE — Telephone Encounter (Signed)
Per CY okay to refill #30 x 5 refills.

## 2016-09-02 MED FILL — SERTRALINE HCL 25 MG TABLET: 25 | 30 days supply | Qty: 30 | Fill #2

## 2016-09-09 ENCOUNTER — Emergency Department (HOSPITAL_BASED_OUTPATIENT_CLINIC_OR_DEPARTMENT_OTHER): Payer: 59

## 2016-09-09 ENCOUNTER — Encounter: Payer: Self-pay | Admitting: Physician Assistant

## 2016-09-09 ENCOUNTER — Encounter (HOSPITAL_BASED_OUTPATIENT_CLINIC_OR_DEPARTMENT_OTHER): Payer: Self-pay | Admitting: Emergency Medicine

## 2016-09-09 ENCOUNTER — Emergency Department (HOSPITAL_BASED_OUTPATIENT_CLINIC_OR_DEPARTMENT_OTHER)
Admission: EM | Admit: 2016-09-09 | Discharge: 2016-09-09 | Disposition: A | Discharge status: Home / Self Care | Payer: 59 | Attending: Emergency Medicine | Admitting: Emergency Medicine

## 2016-09-09 DIAGNOSIS — Z79899 Other long term (current) drug therapy: Secondary | ICD-10-CM | POA: Diagnosis not present

## 2016-09-09 DIAGNOSIS — R0602 Shortness of breath: Secondary | ICD-10-CM | POA: Diagnosis present

## 2016-09-09 DIAGNOSIS — I10 Essential (primary) hypertension: Secondary | ICD-10-CM | POA: Diagnosis not present

## 2016-09-09 DIAGNOSIS — J069 Acute upper respiratory infection, unspecified: Secondary | ICD-10-CM

## 2016-09-09 MED ORDER — GUAIFENESIN-CODEINE 100-10 MG/5ML PO SOLN: 10.0000 mL | Freq: Four times a day (QID) | ORAL | 0 refills | Status: DC | PRN

## 2016-09-09 MED ORDER — ALBUTEROL SULFATE HFA 108 (90 BASE) MCG/ACT IN AERS
2.0000 | INHALATION_SPRAY | RESPIRATORY_TRACT | Status: DC | PRN
  Administered 2016-09-09: 2 via RESPIRATORY_TRACT
  Filled 2016-09-09: qty 6.7

## 2016-09-09 MED FILL — GUAIATUSSIN AC LIQUID: 100-10 | 3 days supply | Qty: 120 | Fill #0

## 2016-09-09 MED FILL — ESZOPICLONE 3 MG TABLET: 3 | 30 days supply | Qty: 30 | Fill #1

## 2016-09-09 NOTE — ED Provider Notes (Signed)
[facility] Provider Note   CSN: [id number] Arrival date & time: 09/09/16  1236     History   Chief Complaint Chief Complaint  Patient presents with  . Shortness of Breath  . Cough    HPI Janet Case is a 56 y.o. female.  Patient is a 56 year old female who presents with chest congestion and cough for the past several days. She denies any fevers or chills. She denies any chest pain. She denies any ill contacts.   The history is provided by the patient.  Shortness of Breath  This is a new problem. The average episode lasts 3 days. The problem has not changed since onset.Associated symptoms include cough. She has tried nothing for the symptoms. The treatment provided no relief. Associated medical issues do not include asthma, COPD or pneumonia.  Cough  Associated symptoms include shortness of breath. Her past medical history does not include pneumonia, COPD or asthma.    Past Medical History:  Diagnosis Date  . Anxiety   . Clonic hemifacial spasm    Left Facial  . Colon polyps   . Depression   . Hypertension   . Muscle spasms of head and/or neck     Patient Active Problem List   Diagnosis Date Noted  . Obstructive sleep apnea 01/25/2016  . Anxiety and depression 09/21/2015  . Cystocele 05/31/2015  . Rectocele 05/31/2015  . Physical exam 05/08/2015  . Essential hypertension 04/12/2015  . Facial twitching 04/12/2015  . Insomnia 04/12/2015  . 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 Family history of breast cancer 04/12/2015    Past Surgical History:  Procedure Laterality Date  . POLYPECTOMY     Oral  . TUBAL LIGATION    . VAGINAL WOUND CLOSURE / REPAIR  1977  . WISDOM TOOTH EXTRACTION      OB History    Gravida Para Term Preterm AB Living   3 3       3    SAB TAB Ectopic Multiple Live Births                   Home Medications    Prior to Admission medications   Medication Sig Start Date End Date Taking? Authorizing Provider    amLODipine (NORVASC) 10 MG tablet TAKE 1 TABLET (10 MG TOTAL) BY MOUTH DAILY. 10/14/15   Waldon MerlWilliam C Martin, PA-C  Eszopiclone 3 MG TABS Take 1 tablet (3 mg total) by mouth at bedtime. Take immediately before bedtime 08/12/16   Waymon Budgelinton D Young, MD  guaiFENesin-codeine 100-10 MG/5ML syrup Take 10 mLs by mouth every 6 (six) hours as needed. 09/09/16   Geoffery Lyonsouglas Bexton Haak, MD  sertraline (ZOLOFT) 25 MG tablet TAKE 1 TABLET (25 MG TOTAL) BY MOUTH AT BEDTIME. 06/30/16   Waldon MerlWilliam C Martin, PA-C  topiramate (TOPAMAX) 50 MG tablet TAKE 1 TABLET BY MOUTH TWICE DAILY 06/30/16   Waldon MerlWilliam C Martin, PA-C  valsartan-hydrochlorothiazide (DIOVAN-HCT) 160-25 MG tablet Take 1 tablet by mouth daily. 03/11/16   Waldon MerlWilliam C Martin, PA-C  zolpidem (AMBIEN) 5 MG tablet Take 1 tablet (5 mg total) by mouth at bedtime as needed. 01/03/16   Waldon MerlWilliam C Martin, PA-C    Family History Family History  Problem Relation Age of Onset  . Hyperlipidemia Mother 3171    Deceased  . Heart disease Mother   . Congestive Heart Failure Mother   . Hypertension Mother   . Kidney disease Mother   . Diabetes Mother   . Dementia Mother   .  Heart defect Mother   . Kidney disease Father 66    Deceased  . Heart attack Father   . Hypertension Sister   . Diabetes Sister   . Breast cancer Sister   . Depression Sister   . Anxiety disorder Sister   . Thyroid cancer Sister     Thyroid Removed  . Leukemia Maternal Aunt   . Allergies Daughter   . GI problems Daughter     Social History Social History  Substance Use Topics  . Smoking status: Never Smoker  . Smokeless tobacco: Never Used  . Alcohol use No     Allergies   Influenza vaccines   Review of Systems Review of Systems  Respiratory: Positive for cough and shortness of breath.   All other systems reviewed and are negative.    Physical Exam Updated Vital Signs BP (!) 149/110 (BP Location: Right Arm)   Pulse 110   Temp 99.7 F (37.6 C) (Oral)   Resp 20   Ht 5\' 4"  (1.626 m)   Wt  220 lb (99.8 kg)   SpO2 97%   BMI 37.76 kg/m   Physical Exam  Constitutional: She is oriented to person, place, and time. She appears well-developed and well-nourished. No distress.  HENT:  Head: Normocephalic and atraumatic.  Neck: Normal range of motion. Neck supple.  Cardiovascular: Normal rate and regular rhythm.  Exam reveals no gallop and no friction rub.   No murmur heard. Pulmonary/Chest: Effort normal and breath sounds normal. No respiratory distress. She has no wheezes.  Abdominal: Soft. Bowel sounds are normal. She exhibits no distension. There is no tenderness.  Musculoskeletal: Normal range of motion.  Neurological: She is alert and oriented to person, place, and time.  Skin: Skin is warm and dry. She is not diaphoretic.  Nursing note and vitals reviewed.    ED Treatments / Results  Labs (all labs ordered are listed, but only abnormal results are displayed) Labs Reviewed - No data to display  EKG  EKG Interpretation None       Radiology Dg Chest 2 View  Result Date: 09/09/2016 CLINICAL DATA:  56 year old female with cough and wheezing for 1 month. Initial encounter. EXAM: CHEST  2 VIEW COMPARISON:  None. FINDINGS: Large body habitus. Low normal lung volumes. Mild tortuosity of the thoracic aorta. Other mediastinal contours are within normal limits. Visualized tracheal air column is within normal limits. No pneumothorax, pulmonary edema, pleural effusion or confluent pulmonary opacity. No acute osseous abnormality identified. Mild dextroconvex thoracic spine curvature. Negative visible bowel gas pattern. IMPRESSION: Somewhat low lung volumes.  No acute cardiopulmonary abnormality. Electronically Signed   By: Odessa Fleming M.D.   On: 09/09/2016 13:31    Procedures Procedures (including critical care time)  Medications Ordered in ED Medications  albuterol (PROVENTIL HFA;VENTOLIN HFA) 108 (90 Base) MCG/ACT inhaler 2 puff (2 puffs Inhalation Given 09/09/16 1400)      Initial Impression / Assessment and Plan / ED Course  I have reviewed the triage vital signs and the nursing notes.  Pertinent labs & imaging results that were available during my care of the patient were reviewed by me and considered in my medical decision making (see chart for details).  Chest x-rays unremarkable. Symptoms likely viral in nature. She will be given an albuterol MDI, cough medication, and is to return as needed.  Final Clinical Impressions(s) / ED Diagnoses   Final diagnoses:  Upper respiratory tract infection, unspecified type    New Prescriptions Discharge Medication  List as of 09/09/2016  1:45 PM    START taking these medications   Details  guaiFENesin-codeine 100-10 MG/5ML syrup Take 10 mLs by mouth every 6 (six) hours as needed., Starting Wed 09/09/2016, Print         Geoffery Lyons, MD 09/09/16 1450

## 2016-09-09 NOTE — Discharge Instructions (Signed)
Albuterol inhaler: 2 puffs every 4 hours as needed for wheezing.  Robitussin with codeine as needed for cough.  Return to the emergency department if your symptoms significantly worsen or change.

## 2016-09-09 NOTE — ED Triage Notes (Signed)
Patient reports cough, wheezing, shortness of breath which began yesterday.  States this has worsened today.  Patient also reports "I feel like I have a low grade fever".

## 2016-09-14 ENCOUNTER — Telehealth: Payer: Self-pay | Admitting: Physician Assistant

## 2016-09-14 MED FILL — TOPIRAMATE 50 MG TABLET: 50 | 30 days supply | Qty: 60 | Fill #2

## 2016-09-14 NOTE — Telephone Encounter (Signed)
Pt ok to transfer. But looks like sometime since being seen. Schedule for visit with me. New pt establish so 30 minutes. Early am or pm minimize wait time. Have her actually switched at the day/time she checks in.

## 2016-09-14 NOTE — Telephone Encounter (Signed)
Patient scheduled for 3/7.

## 2016-09-14 NOTE — Telephone Encounter (Signed)
Patient request to transfer care from [facility] to [facility] because of distance.

## 2016-09-16 ENCOUNTER — Ambulatory Visit (INDEPENDENT_AMBULATORY_CARE_PROVIDER_SITE_OTHER): Payer: 59 | Admitting: Medical

## 2016-09-16 ENCOUNTER — Encounter: Payer: Self-pay | Admitting: Medical

## 2016-09-16 ENCOUNTER — Ambulatory Visit: Payer: 59 | Admitting: Medical

## 2016-09-16 VITALS — BP 111/81 | HR 85 | Temp 97.9°F | Ht 64.0 in | Wt 213.2 lb

## 2016-09-16 DIAGNOSIS — R05 Cough: Secondary | ICD-10-CM

## 2016-09-16 DIAGNOSIS — R0981 Nasal congestion: Secondary | ICD-10-CM | POA: Diagnosis not present

## 2016-09-16 DIAGNOSIS — R062 Wheezing: Secondary | ICD-10-CM | POA: Diagnosis not present

## 2016-09-16 DIAGNOSIS — J40 Bronchitis, not specified as acute or chronic: Secondary | ICD-10-CM | POA: Diagnosis not present

## 2016-09-16 DIAGNOSIS — R059 Cough, unspecified: Secondary | ICD-10-CM

## 2016-09-16 MED ORDER — BENZONATATE 100 MG PO CAPS: 100.0000 mg | ORAL_CAPSULE | Freq: Three times a day (TID) | ORAL | 0 refills | Status: DC | PRN

## 2016-09-16 MED ORDER — FLUTICASONE PROPIONATE 50 MCG/ACT NA SUSP: 2.0000 | Freq: Every day | NASAL | 1 refills | Status: DC

## 2016-09-16 MED ORDER — FLUTICASONE PROPIONATE HFA 110 MCG/ACT IN AERO: 2.0000 | INHALATION_SPRAY | Freq: Two times a day (BID) | RESPIRATORY_TRACT | 2 refills | Status: DC

## 2016-09-16 MED ORDER — AZITHROMYCIN 250 MG PO TABS: ORAL_TABLET | ORAL | 0 refills | Status: DC

## 2016-09-16 MED ORDER — CLONAZEPAM 0.5 MG PO TABS: 0.5000 mg | ORAL_TABLET | Freq: Every day | ORAL | 0 refills | Status: DC

## 2016-09-16 MED FILL — FLUTICASONE PROP 50 MCG SPR: 50 | 30 days supply | Qty: 16 | Fill #0

## 2016-09-16 MED FILL — AZITHROMYCIN 250 MG TABLET: 250 | 5 days supply | Qty: 6 | Fill #0

## 2016-09-16 MED FILL — BENZONATATE 100 MG CAP: 100 | 7 days supply | Qty: 21 | Fill #0

## 2016-09-16 MED FILL — clonazePAM 0.5 MG TABS: 0.5 | 5 days supply | Qty: 5 | Fill #0

## 2016-09-16 NOTE — Progress Notes (Signed)
Subjective:    Patient ID: Janet Case, female    DOB: [birth year], 56 y.o.   MRN: [id number]  HPI  Pt in today she went to ED on Thursday. Pt states dx uri and she was wheezing. Pt gave robitussin AC and gave albuterol inhaler. Pt feels little better but not completely. Wheezing has improved some. She feels tired easily. Pt has missed work. Wheezing about 50% better. Pt states overall still feeling sick after one week. Pt does not smoke. Denies hx of pneumoinia, copd or asthma.  Pt does have some second hand exposure as a child and daughter does smoke.     Review of Systems  Constitutional: Negative for chills, fatigue and fever.  HENT: Positive for congestion, postnasal drip and sore throat.        The other day.  Respiratory: Positive for cough and wheezing. Negative for chest tightness and shortness of breath.        Rare wheeze  Cardiovascular: Negative for chest pain and palpitations.  Gastrointestinal: Negative for abdominal pain.  Musculoskeletal: Negative for back pain.  Neurological: Negative for dizziness, speech difficulty, weakness, numbness and headaches.  Hematological: Negative for adenopathy. Does not bruise/bleed easily.  Psychiatric/Behavioral: Positive for sleep disturbance. Negative for behavioral problems and confusion.       Pt failed zolpidem, lunesta and trazadone.     Past Medical History:  Diagnosis Date  . Anxiety   . Clonic hemifacial spasm    Left Facial  . Colon polyps   . Depression   . Hypertension   . Muscle spasms of head and/or neck      Social History   Social History  . Marital status: Divorced    Spouse name: N/A  . Number of children: 2  . Years of education: N/A   Occupational History  . Customer Service Representative     [employer]   Social History Main Topics  . Smoking status: Never Smoker  . Smokeless tobacco: Never Used  . Alcohol use No  . Drug use: No  . Sexual activity: No   Other Topics Concern  . Not on file    Social History Narrative  . No narrative on file    Past Surgical History:  Procedure Laterality Date  . POLYPECTOMY     Oral  . TUBAL LIGATION    . VAGINAL WOUND CLOSURE / REPAIR  1977  . WISDOM TOOTH EXTRACTION      Family History  Problem Relation Age of Onset  . Hyperlipidemia Mother 9771    Deceased  . Heart disease Mother   . Congestive Heart Failure Mother   . Hypertension Mother   . Kidney disease Mother   . Diabetes Mother   . Dementia Mother   . Heart defect Mother   . Kidney disease Father 2280    Deceased  . Heart attack Father   . Hypertension Sister   . Diabetes Sister   . Breast cancer Sister   . Depression Sister   . Anxiety disorder Sister   . Thyroid cancer Sister     Thyroid Removed  . Leukemia Maternal Aunt   . Allergies Daughter   . GI problems Daughter     Allergies  Allergen Reactions  . Influenza Vaccines Nausea Only    Current Outpatient Prescriptions on File Prior to Visit  Medication Sig Dispense Refill  . amLODipine (NORVASC) 10 MG tablet TAKE 1 TABLET (10 MG TOTAL) BY MOUTH DAILY. 30 tablet 5  .  Eszopiclone 3 MG TABS Take 1 tablet (3 mg total) by mouth at bedtime. Take immediately before bedtime 30 tablet 5  . guaiFENesin-codeine 100-10 MG/5ML syrup Take 10 mLs by mouth every 6 (six) hours as needed. 120 mL 0  . sertraline (ZOLOFT) 25 MG tablet TAKE 1 TABLET (25 MG TOTAL) BY MOUTH AT BEDTIME. 30 tablet 3  . topiramate (TOPAMAX) 50 MG tablet TAKE 1 TABLET BY MOUTH TWICE DAILY 60 tablet 3  . valsartan-hydrochlorothiazide (DIOVAN-HCT) 160-25 MG tablet Take 1 tablet by mouth daily. 30 tablet 3  . zolpidem (AMBIEN) 5 MG tablet Take 1 tablet (5 mg total) by mouth at bedtime as needed. 30 tablet 1   No current facility-administered medications on file prior to visit.     BP 111/81 (BP Location: Left Arm, Cuff Size: Normal)   Pulse 85   Temp 97.9 F (36.6 C) (Oral)   Ht 5\' 4"  (1.626 m)   Wt 213 lb 3.2 oz (96.7 kg)   SpO2 99%   BMI  36.60 kg/m       Objective:   Physical Exam  General  Mental Status - Alert. General Appearance - Well groomed. Not in acute distress.  Skin Rashes- No Rashes.  HEENT Head- Normal. Ear Auditory Canal - Left- Normal. Right - Normal.Tympanic Membrane- Left- Normal. Right- Normal. Eye Sclera/Conjunctiva- Left- Normal. Right- Normal. Nose & Sinuses Nasal Mucosa- Left-  Boggy and Congested. Right-  Boggy and  Congested.Bilateral no  maxillary and no  frontal sinus pressure. Mouth & Throat Lips: Upper Lip- Normal: no dryness, cracking, pallor, cyanosis, or vesicular eruption. Lower Lip-Normal: no dryness, cracking, pallor, cyanosis or vesicular eruption. Buccal Mucosa- Bilateral- No Aphthous ulcers. Oropharynx- No Discharge or Erythema. Tonsils: Characteristics- Bilateral- No Erythema or Congestion. Size/Enlargement- Bilateral- No enlargement. Discharge- bilateral-None.  Neck Neck- Supple. No Masses.   Chest and Lung Exam Auscultation: Breath Sounds:-Clear even and unlabored.  Cardiovascular Auscultation:Rythm- Regular, rate and rhythm. Murmurs & Other Heart Sounds:Ausculatation of the heart reveal- No Murmurs.  Lymphatic Head & Neck General Head & Neck Lymphatics: Bilateral: Description- No Localized lymphadenopathy.       Assessment & Plan:  You appeared to have uri vs allergic rhinitis type symptoms one week or more. Now after one week may have bronchitis as well as otitis media on rt side(by exam). Will rx azithromycin antibiotic.  For cough will rx benzonatate.  For nasal congestion will rx flonase.  For any wheezing will rx flovent. You can use this daily if you have to overuse albuterol inhaler.   For insomnia would recommend stop lunesta. Will clonazepam to use low dose.  Follow up in 2 weeks or as needed    Kacyn Souder, Ramon Dredge, [employer]

## 2016-09-16 NOTE — Patient Instructions (Addendum)
You appeared to have uri vs allergic rhinitis type symptoms for one week or more. Now after one week may have bronchitis as well as otitis media on rt side(by exam). Will rx azithromycin antibiotic.  For cough will rx benzonatate.  For nasal congestion will rx flonase.  For any wheezing will rx flovent. You can use this daily if you have to overuse albuterol inhaler.   For insomnia would recommend stop lunesta. Will clonazepam to use low dose.  Follow up in 2 weeks or as needed  Advised if using benzonatate for cough then stop robitussin ac

## 2016-09-16 NOTE — Progress Notes (Signed)
Pre visit review using our clinic review tool, if applicable. No additional management support is needed unless otherwise documented below in the visit note. 

## 2016-09-23 ENCOUNTER — Other Ambulatory Visit: Payer: Self-pay | Admitting: Medical

## 2016-09-24 ENCOUNTER — Encounter: Payer: Self-pay | Admitting: Medical

## 2016-09-25 ENCOUNTER — Telehealth: Payer: Self-pay | Admitting: Medical

## 2016-09-25 NOTE — Telephone Encounter (Signed)
Pt sent me chart message stating vaginal itch and discharge. She also states needed fmla papers filled out. I have seen her one time. Not sure all details regarding her abscense in past. So need 30 minute slot. Would you notify pt. Also Victorino DikeJennifer states forms placed in my basket or folders. I don't see any forms. So I need those before she is scheduled.

## 2016-09-25 NOTE — Telephone Encounter (Signed)
Dropped paperwork off, placed in basket

## 2016-09-25 NOTE — Telephone Encounter (Signed)
Would you mind showing me where you placed the forms. I looked did not see any form?

## 2016-09-28 NOTE — Telephone Encounter (Signed)
Left message on patient's VM explaining that she needs to have FMLA papers faxed to provider and she needs to schedule an office visit. (30 minutes)

## 2016-09-28 NOTE — Telephone Encounter (Signed)
Please contact patient and schedule 30-minute appointment and inform patient that she needs to have FMLA paperwork faxed prior to her appointment [Edward does not have the original fax?], per provider instructions/SLS 03/19

## 2016-09-29 DIAGNOSIS — Z0279 Encounter for issue of other medical certificate: Secondary | ICD-10-CM

## 2016-09-29 NOTE — Telephone Encounter (Signed)
FMLA paperwork received via mail.  Paperwork forwarded to PCP for completion.

## 2016-09-29 NOTE — Telephone Encounter (Addendum)
As of yesterday on my half day. I did not see fmla paperwork. Did it we get it after I left?  Please let me know and I did want her to have a 30 minute appointment with me since she is new to me and this would help me fill out form

## 2016-09-30 NOTE — Telephone Encounter (Signed)
Please call patient and schedule a 30 min office [facility].

## 2016-09-30 NOTE — Telephone Encounter (Signed)
lvm advising patient of message below °

## 2016-10-02 ENCOUNTER — Other Ambulatory Visit (HOSPITAL_COMMUNITY)
Admission: RE | Admit: 2016-10-02 | Discharge: 2016-10-02 | Disposition: A | Discharge status: Home / Self Care | Payer: 59 | Source: Ambulatory Visit | Attending: Medical | Admitting: Medical

## 2016-10-02 ENCOUNTER — Ambulatory Visit (INDEPENDENT_AMBULATORY_CARE_PROVIDER_SITE_OTHER): Payer: 59 | Admitting: Medical

## 2016-10-02 VITALS — BP 112/66 | HR 78 | Temp 98.3°F | Ht 64.0 in | Wt 216.0 lb

## 2016-10-02 DIAGNOSIS — N898 Other specified noninflammatory disorders of vagina: Secondary | ICD-10-CM

## 2016-10-02 DIAGNOSIS — F419 Anxiety disorder, unspecified: Secondary | ICD-10-CM

## 2016-10-02 DIAGNOSIS — G4700 Insomnia, unspecified: Secondary | ICD-10-CM | POA: Diagnosis not present

## 2016-10-02 DIAGNOSIS — B9689 Other specified bacterial agents as the cause of diseases classified elsewhere: Secondary | ICD-10-CM | POA: Diagnosis not present

## 2016-10-02 DIAGNOSIS — N760 Acute vaginitis: Secondary | ICD-10-CM | POA: Diagnosis not present

## 2016-10-02 MED ORDER — CLONAZEPAM 0.5 MG PO TABS: 0.5000 mg | ORAL_TABLET | Freq: Every day | ORAL | 0 refills | Status: DC

## 2016-10-02 MED ORDER — FLUCONAZOLE 150 MG PO TABS: 150.0000 mg | ORAL_TABLET | Freq: Once | ORAL | 0 refills | Status: AC

## 2016-10-02 MED FILL — FLUCONAZOLE 150 MG TABLET: 150 | 1 days supply | Qty: 1 | Fill #0

## 2016-10-02 MED FILL — clonazePAM 0.5 MG TABS: 0.5 | 30 days supply | Qty: 30 | Fill #0

## 2016-10-02 NOTE — Patient Instructions (Signed)
For your recent vaginal dicharge will rx diflucan as yeast infection appears likley. Will do ancillary studies to confirm and rule out other causes.  For stress, insomnia and anxiety continue sertraline and rx clonopin. Take the clonopin sparingly as advised.   Follow up in 1-2 month or as needed. You could schedule cpe on that next visit.

## 2016-10-02 NOTE — Progress Notes (Signed)
Subjective:    Patient ID: Janet Case, female    DOB: 06-05-1961, 56 y.o.   MRN: [id number]  HPI  Pt in for some slight discharge from vaginal area as well. States seemed to start after finishing azithromycin. Around 6 days now. States whitish/yellow dc.  No fever,no chills or sweats.     Pt in past has had severe stress and sometimes panic attacks at work. She states very severe at times. Pt states sometimes stress is so severe over that she misses 3 consecutive days in a row. Pt states that she had prior form that gave flexibility up 3 days abcense every 7 days.  Pt is still on sertraline in the past is currenlty still on. Pt is clonopin. She states it did calm her down at night. It helped her sleep and seemed to help her some at work.    Review of Systems  Constitutional: Negative for chills and fatigue.  Respiratory: Negative for cough, choking, shortness of breath and wheezing.   Cardiovascular: Negative for chest pain and palpitations.  Gastrointestinal: Negative for abdominal pain.  Genitourinary: Negative for decreased urine volume, dyspareunia, dysuria, flank pain, frequency, hematuria, menstrual problem and urgency.  Musculoskeletal: Negative for back pain and gait problem.  Neurological: Negative for dizziness and headaches.  Hematological: Negative for adenopathy. Does not bruise/bleed easily.  Psychiatric/Behavioral: Positive for sleep disturbance. Negative for confusion, dysphoric mood and suicidal ideas. The patient is nervous/anxious.        Stress at work.   Past Medical History:  Diagnosis Date  . Anxiety   . Clonic hemifacial spasm    Left Facial  . Colon polyps   . Depression   . Hypertension   . Muscle spasms of head and/or neck      Social History   Social History  . Marital status: Divorced    Spouse name: N/A  . Number of children: 2  . Years of education: N/A   Occupational History  . Customer Service Representative     [employer]   Social  History Main Topics  . Smoking status: Never Smoker  . Smokeless tobacco: Never Used  . Alcohol use No  . Drug use: No  . Sexual activity: No   Other Topics Concern  . Not on file   Social History Narrative  . No narrative on file    Past Surgical History:  Procedure Laterality Date  . POLYPECTOMY     Oral  . TUBAL LIGATION    . VAGINAL WOUND CLOSURE / REPAIR  1977  . WISDOM TOOTH EXTRACTION      Family History  Problem Relation Age of Onset  . Hyperlipidemia Mother 22    Deceased  . Heart disease Mother   . Congestive Heart Failure Mother   . Hypertension Mother   . Kidney disease Mother   . Diabetes Mother   . Dementia Mother   . Heart defect Mother   . Kidney disease Father 20    Deceased  . Heart attack Father   . Hypertension Sister   . Diabetes Sister   . Breast cancer Sister   . Depression Sister   . Anxiety disorder Sister   . Thyroid cancer Sister     Thyroid Removed  . Leukemia Maternal Aunt   . Allergies Daughter   . GI problems Daughter     Allergies  Allergen Reactions  . Influenza Vaccines Nausea Only    Current Outpatient Prescriptions on File Prior to Visit  Medication Sig Dispense Refill  . amLODipine (NORVASC) 10 MG tablet TAKE 1 TABLET (10 MG TOTAL) BY MOUTH DAILY. 30 tablet 5  . azithromycin (ZITHROMAX) 250 MG tablet Take 2 tablets by mouth on day 1, followed by 1 tablet by mouth daily for 4 days. 6 tablet 0  . benzonatate (TESSALON) 100 MG capsule Take 1 capsule (100 mg total) by mouth 3 (three) times daily as needed for cough. 21 capsule 0  . clonazePAM (KLONOPIN) 0.5 MG tablet Take 1 tablet (0.5 mg total) by mouth at bedtime. 5 tablet 0  . fluticasone (FLONASE) 50 MCG/ACT nasal spray Place 2 sprays into both nostrils daily. 16 g 1  . fluticasone (FLOVENT HFA) 110 MCG/ACT inhaler Inhale 2 puffs into the lungs 2 (two) times daily. 1 Inhaler 2  . guaiFENesin-codeine 100-10 MG/5ML syrup Take 10 mLs by mouth every 6 (six) hours as  needed. 120 mL 0  . sertraline (ZOLOFT) 25 MG tablet TAKE 1 TABLET (25 MG TOTAL) BY MOUTH AT BEDTIME. 30 tablet 3  . topiramate (TOPAMAX) 50 MG tablet TAKE 1 TABLET BY MOUTH TWICE DAILY 60 tablet 3  . valsartan-hydrochlorothiazide (DIOVAN-HCT) 160-25 MG tablet Take 1 tablet by mouth daily. 30 tablet 3   No current facility-administered medications on file prior to visit.     BP 112/66   Pulse 78   Temp 98.3 F (36.8 C) (Oral)   Ht 5\' 4"  (1.626 m)   Wt 216 lb (98 kg)   SpO2 98%   BMI 37.08 kg/m       Objective:   Physical Exam  General- No acute distress. Pleasant patient. Lungs- Clear, even and unlabored. Heart- regular rate and rhythm. Neurologic- CNII- XII grossly intact. Abdomen- soft, nt, nd, +bs, no rebound or guarding Back- no cva tenderness.      Assessment & Plan:  For your recent vaginal dicharge will rx diflucan as yeast infection appears likley. Will do ancillary studies to confirm and rule out other causes.  For stress, insomnia and anxiety continue sertraline and rx clonopin. Take the clonopin sparingly as advised.   Follow up in 1-2 month or as needed. You could schedule cpe on that next visit.  Janet Case, Janet DredgeEdward, PA-C  Note I filled out pt fmla form allowing 3 days off every 7 days. This she states how she neededin past. I wrote in this manner in case severe anxiety reoccurs but I am optimistic she won't have to use this many days off. I wrote that on the form. I did a write the 3 days off every 7 days in that manner as this was the way she stated former provider had written and she requested that I do the same.

## 2016-10-02 NOTE — Progress Notes (Signed)
Pre visit review using our clinic review tool, if applicable. No additional management support is needed unless otherwise documented below in the visit note. 

## 2016-10-05 ENCOUNTER — Encounter: Payer: Self-pay | Admitting: Medical

## 2016-10-05 MED FILL — AMLODIPINE BESYLATE 10 MG T: 10 | 30 days supply | Qty: 30 | Fill #4

## 2016-10-05 MED FILL — VALSARTAN-HCTZ 160-25 MG TA: 160-25 | 30 days supply | Qty: 30 | Fill #2

## 2016-10-05 MED FILL — SERTRALINE HCL 25 MG TABLET: 25 | 30 days supply | Qty: 30 | Fill #3

## 2016-10-06 ENCOUNTER — Telehealth: Payer: Self-pay | Admitting: Medical

## 2016-10-06 ENCOUNTER — Encounter: Payer: Self-pay | Admitting: Medical

## 2016-10-06 LAB — URINE CYTOLOGY ANCILLARY ONLY
Chlamydia: NEGATIVE
NEISSERIA GONORRHEA: NEGATIVE
TRICH (WINDOWPATH): POSITIVE — AB

## 2016-10-06 MED ORDER — METRONIDAZOLE 500 MG PO TABS: 500.0000 mg | ORAL_TABLET | Freq: Two times a day (BID) | ORAL | 0 refills | Status: DC

## 2016-10-06 MED FILL — ESZOPICLONE 3 MG TABLET: 3 | 30 days supply | Qty: 30 | Fill #2

## 2016-10-06 NOTE — Telephone Encounter (Signed)
Pt urine ancillary came back positive for trichomonas. Sexually transmitted. So her partner need to tested and treated pending results. Oherwise she could get reinfected.

## 2016-10-06 NOTE — Telephone Encounter (Signed)
[facility] @ 3:52pm @ [phone]) asking the pt to RTC regarding FMLA paperwork.//AB/CMA

## 2016-10-06 NOTE — Telephone Encounter (Signed)
I need you to look at lab section. Results of urinary ancillary are in lab section. Does if reflect that I or any one else resulted this. If so when I don't remember seeing this but result in computer?

## 2016-10-06 NOTE — Telephone Encounter (Signed)
Did you get back urine ancillary studies that got in lab section but appears I never resulted. Will you review most recent urine ancillary and let me know your opinion how that happened.

## 2016-10-06 NOTE — Telephone Encounter (Signed)
Received completed and signed FMLA paperwork from [facility].  [facility] forms [facility] and [facility] @ [phone]).  Confirmation received.  Original sent to be scanned into the chart.//AB/CMA

## 2016-10-07 MED FILL — metroNIDAZOLE 500 MG TABS: 500 | 7 days supply | Qty: 14 | Fill #0

## 2016-10-07 NOTE — Telephone Encounter (Signed)
Left message for pt to return my call.

## 2016-10-07 NOTE — Telephone Encounter (Signed)
Copy & pate from Paperwork note:  Patient Calls   [facility] Sagu[facility], PA-C routed conversation to You; Tylene FantasiaAshlee S Langston, RN 13 hours ago (6:31 PM)    Esperanza RichtersEdward Saguier, PA-C 14 hours ago (6:27 PM)      Pt urine ancillary came back positive for trichomonas. Sexually transmitted. So her partner need to tested and treated pending results. Oherwise she could get reinfected.      Documentation

## 2016-10-08 LAB — URINE CYTOLOGY ANCILLARY ONLY
Bacterial vaginitis: POSITIVE — AB
CANDIDA VAGINITIS: NEGATIVE

## 2016-10-08 NOTE — Telephone Encounter (Signed)
[facility] @ 11:14am @ [phone]) asking the pt to RTC regarding FMLA paperwork and MyChart message.//AB/CMA

## 2016-10-12 ENCOUNTER — Other Ambulatory Visit: Payer: Self-pay | Admitting: *Deleted

## 2016-10-12 ENCOUNTER — Encounter: Payer: Self-pay | Admitting: *Deleted

## 2016-10-12 NOTE — Telephone Encounter (Signed)
Sent pt a MyChart message today regarding recent lab results and FMLA paperwork.//AB/CMA

## 2016-10-12 NOTE — Progress Notes (Unsigned)
Trazodone  °

## 2016-10-12 NOTE — Telephone Encounter (Signed)
See 10/06/16 patient email.

## 2016-11-25 ENCOUNTER — Encounter: Payer: Self-pay | Admitting: Gynecology

## 2016-12-04 ENCOUNTER — Other Ambulatory Visit: Payer: Self-pay | Admitting: Physician Assistant

## 2016-12-04 MED FILL — ESZOPICLONE 3 MG TABLET: 3 | 30 days supply | Qty: 30 | Fill #3

## 2016-12-04 MED FILL — TOPIRAMATE 50 MG TABLET: 50 | 30 days supply | Qty: 60 | Fill #3

## 2016-12-04 MED FILL — SERTRALINE HCL 25 MG TABLET: 25 | 30 days supply | Qty: 30 | Fill #0

## 2016-12-04 NOTE — Telephone Encounter (Signed)
Requesting: sertraline (Zoloft) 25 MG tablet Last OV: 10/02/16 Last Refill: 06/30/16  Please Advise

## 2016-12-11 ENCOUNTER — Telehealth: Payer: Self-pay | Admitting: Medical

## 2016-12-11 MED ORDER — CLONAZEPAM 0.5 MG PO TABS: 0.5000 mg | ORAL_TABLET | Freq: Every day | ORAL | 0 refills | Status: DC

## 2016-12-11 NOTE — Telephone Encounter (Signed)
She can pick up signed klonopin rx. Sign contact and give uds.

## 2016-12-11 NOTE — Telephone Encounter (Signed)
Refill Request: Clonazepam  Last RX:10/02/16 Last OV:10/02/16 Next OV: None scheduled UDS: No UDS CSC: No Contract

## 2016-12-11 NOTE — Addendum Note (Signed)
Addended by: Orlene OchRENCE, Fuad Forget N on: 12/11/2016 11:27 AM   Modules accepted: Orders

## 2016-12-11 NOTE — Telephone Encounter (Signed)
[facility] [place] - [phone]) [phone] (Fax)   Pharmacy states several faxes were sent requesting clonazePAM (KLONOPIN) 0.5 MG tablet, please advise

## 2016-12-14 ENCOUNTER — Other Ambulatory Visit: Payer: Self-pay | Admitting: Medical

## 2016-12-30 ENCOUNTER — Encounter: Payer: Self-pay | Admitting: Medical

## 2016-12-30 NOTE — Telephone Encounter (Signed)
Dr Patsy Lageropland-- can you advise in PCP's absence?

## 2017-01-02 ENCOUNTER — Telehealth: Payer: Self-pay | Admitting: Medical

## 2017-01-02 MED ORDER — ZOLPIDEM TARTRATE 5 MG PO TABS: 5.0000 mg | ORAL_TABLET | Freq: Every evening | ORAL | 0 refills | Status: DC | PRN

## 2017-01-02 NOTE — Telephone Encounter (Signed)
I got note from Dr. Patsy Lageropland while out of office. Sorry for the delay in getting back with pt. She has anxiety and insomnia. Recently pt expressed more insomnia. I could write low dose ambien for 2 weeks. But if I do this I don't want to use any clonopin at night. I can print the rx and then sign the rx on Wednesday when I am back in the office. If you would get if off the printer and put the rx on my desk so I can sign on Wednesday.

## 2017-01-02 NOTE — Telephone Encounter (Signed)
Opened for review and send ambien rx.

## 2017-01-04 NOTE — Telephone Encounter (Signed)
Rx Placed on your desk

## 2017-01-06 NOTE — Telephone Encounter (Signed)
Left pt a message to call back. 

## 2017-01-20 ENCOUNTER — Other Ambulatory Visit: Payer: Self-pay | Admitting: Medical

## 2017-01-21 MED FILL — ESZOPICLONE 3 MG TABLET: 3 | 30 days supply | Qty: 30 | Fill #4

## 2017-01-29 ENCOUNTER — Other Ambulatory Visit: Payer: Self-pay

## 2017-01-29 ENCOUNTER — Encounter: Payer: Self-pay | Admitting: Medical

## 2017-01-29 NOTE — Progress Notes (Signed)
Refill Request: Zolpidem  Last RX:01/02/17 Last OV:10/02/16 Next ZO:XWRUOV:None scheduled EAV:WUJWDS:None on file  JXB:JYNWCSC:None on file

## 2017-01-31 ENCOUNTER — Telehealth: Payer: Self-pay | Admitting: Medical

## 2017-01-31 NOTE — Telephone Encounter (Signed)
Pt needs appointment to discuss possible use of ambien. New rx. If she get other rx  olf amiben would need controlled med contract signed and uds. Also she needs appointment to discuss her thyroid concerns. So advise appointment. Not filing Remus Lofflerambien just yet. Have her make appointment.

## 2017-02-01 NOTE — Telephone Encounter (Signed)
MyChart message sent encouraging patient to call the office to schedule an appointment.

## 2017-02-03 NOTE — Telephone Encounter (Signed)
lvm for pt to return call to schedule an apt.  °

## 2017-02-03 NOTE — Telephone Encounter (Signed)
Please call and offer patient an appt. Thanks.

## 2017-02-21 ENCOUNTER — Telehealth: Payer: Self-pay | Admitting: Medical

## 2017-02-24 MED ORDER — ZOLPIDEM TARTRATE 5 MG PO TABS: 5.0000 mg | ORAL_TABLET | Freq: Every evening | ORAL | 0 refills | Status: DC | PRN

## 2017-02-24 NOTE — Telephone Encounter (Signed)
Pt.notified

## 2017-02-24 NOTE — Telephone Encounter (Signed)
Pt is requesting refill on Ambien 5mg .  Last OV: 10/02/2016 Last Fill: 01/02/2017 #15 and 0RF UDS: None

## 2017-02-24 NOTE — Telephone Encounter (Signed)
Refilled pt ambien. Have her follow up before next refill. Will get uds.

## 2017-03-10 ENCOUNTER — Other Ambulatory Visit: Payer: Self-pay

## 2017-03-10 MED ORDER — AMLODIPINE BESYLATE 10 MG PO TABS: ORAL_TABLET | ORAL | 5 refills | Status: DC

## 2017-03-10 NOTE — Progress Notes (Unsigned)
Pt requesting amlodipine last office visit 10/02/16 last rx 10/14/15. Please advise.      She can get a refill of amlodipine. I sent in the prescription but advised her to be seen sometime by mid October for BP blood pressure check and flu vaccine.

## 2017-03-17 ENCOUNTER — Encounter: Payer: Self-pay | Admitting: Medical

## 2017-03-17 ENCOUNTER — Ambulatory Visit (INDEPENDENT_AMBULATORY_CARE_PROVIDER_SITE_OTHER): Payer: 59 | Admitting: Medical

## 2017-03-17 VITALS — BP 124/87 | HR 116 | Temp 98.5°F | Resp 16 | Ht 64.0 in | Wt 187.8 lb

## 2017-03-17 DIAGNOSIS — Z79899 Other long term (current) drug therapy: Secondary | ICD-10-CM

## 2017-03-17 DIAGNOSIS — L709 Acne, unspecified: Secondary | ICD-10-CM | POA: Diagnosis not present

## 2017-03-17 DIAGNOSIS — F419 Anxiety disorder, unspecified: Secondary | ICD-10-CM

## 2017-03-17 DIAGNOSIS — G4700 Insomnia, unspecified: Secondary | ICD-10-CM | POA: Diagnosis not present

## 2017-03-17 MED ORDER — ESZOPICLONE 3 MG PO TABS: 3.0000 mg | ORAL_TABLET | Freq: Every day | ORAL | 3 refills | Status: DC

## 2017-03-17 MED ORDER — MUPIROCIN 2 % EX OINT: TOPICAL_OINTMENT | CUTANEOUS | 0 refills | Status: DC

## 2017-03-17 MED ORDER — CLONAZEPAM 0.5 MG PO TABS: ORAL_TABLET | ORAL | 3 refills | Status: DC

## 2017-03-17 MED FILL — MUPIROCIN 2% OINTMENT: 2 | 30 days supply | Qty: 22 | Fill #0

## 2017-03-17 MED FILL — clonazePAM 0.5 MG TABS: 0.5 | 30 days supply | Qty: 30 | Fill #0

## 2017-03-17 MED FILL — ESZOPICLONE 3 MG TABS: 3 | 15 days supply | Qty: 15 | Fill #0

## 2017-03-17 NOTE — Progress Notes (Signed)
Subjective:    Patient ID: Janet Case, female    DOB: Dec 07, 1960, 56 y.o.   MRN: [id number]  HPI  Pt in for follow up on anxiety. Pt work causes her the most anxiety. Pt takes clonazepam about one tablet a day. In past she did well but wanted her to follow up for recheck. So we could get her to sign contract and give UDS.  Also recent mild chin rash(she states like pimples). Pt states that it was worse 2 weeks ago. Pt states now almost back to normal.  Review of Systems  Constitutional: Negative for chills, fatigue and fever.  HENT: Negative for congestion, drooling, facial swelling, nosebleeds and postnasal drip.   Respiratory: Negative for cough, chest tightness, shortness of breath and wheezing.   Cardiovascular: Negative for chest pain and palpitations.  Gastrointestinal: Negative for abdominal distention, anal bleeding, diarrhea, nausea and vomiting.  Musculoskeletal: Negative for back pain.  Skin: Negative for rash.       See hpi.  Neurological: Negative for dizziness, seizures, speech difficulty, weakness and headaches.  Hematological: Negative for adenopathy. Does not bruise/bleed easily.  Psychiatric/Behavioral: Positive for sleep disturbance. Negative for behavioral problems, confusion, decreased concentration, dysphoric mood and suicidal ideas. The patient is nervous/anxious.        Rare decreased mood. Not daily.    Past Medical History:  Diagnosis Date  . Anxiety   . Clonic hemifacial spasm    Left Facial  . Colon polyps   . Depression   . Hypertension   . Muscle spasms of head and/or neck      Social History   Social History  . Marital status: Divorced    Spouse name: N/A  . Number of children: 2  . Years of education: N/A   Occupational History  . Customer Service Representative     [employer]   Social History Main Topics  . Smoking status: Never Smoker  . Smokeless tobacco: Never Used  . Alcohol use No  . Drug use: No  . Sexual activity: No   Other  Topics Concern  . Not on file   Social History Narrative  . No narrative on file    Past Surgical History:  Procedure Laterality Date  . POLYPECTOMY     Oral  . TUBAL LIGATION    . VAGINAL WOUND CLOSURE / REPAIR  1977  . WISDOM TOOTH EXTRACTION      Family History  Problem Relation Age of Onset  . Hyperlipidemia Mother 2471       Deceased  . Heart disease Mother   . Congestive Heart Failure Mother   . Hypertension Mother   . Kidney disease Mother   . Diabetes Mother   . Dementia Mother   . Heart defect Mother   . Kidney disease Father 2880       Deceased  . Heart attack Father   . Hypertension Sister   . Diabetes Sister   . Breast cancer Sister   . Depression Sister   . Anxiety disorder Sister   . Thyroid cancer Sister        Thyroid Removed  . Leukemia Maternal Aunt   . Allergies Daughter   . GI problems Daughter     Allergies  Allergen Reactions  . Influenza Vaccines Nausea Only    Current Outpatient Prescriptions on File Prior to Visit  Medication Sig Dispense Refill  . amLODipine (NORVASC) 10 MG tablet TAKE 1 TABLET (10 MG TOTAL) BY MOUTH DAILY. 30  tablet 5  . azithromycin (ZITHROMAX) 250 MG tablet Take 2 tablets by mouth on day 1, followed by 1 tablet by mouth daily for 4 days. 6 tablet 0  . benzonatate (TESSALON) 100 MG capsule Take 1 capsule (100 mg total) by mouth 3 (three) times daily as needed for cough. 21 capsule 0  . clonazePAM (KLONOPIN) 0.5 MG tablet Take 1 tablet (0.5 mg total) by mouth at bedtime. 30 tablet 0  . fluticasone (FLONASE) 50 MCG/ACT nasal spray Place 2 sprays into both nostrils daily. 16 g 1  . fluticasone (FLOVENT HFA) 110 MCG/ACT inhaler Inhale 2 puffs into the lungs 2 (two) times daily. 1 Inhaler 2  . guaiFENesin-codeine 100-10 MG/5ML syrup Take 10 mLs by mouth every 6 (six) hours as needed. 120 mL 0  . metroNIDAZOLE (FLAGYL) 500 MG tablet Take 1 tablet (500 mg total) by mouth 2 (two) times daily. 14 tablet 0  . sertraline (ZOLOFT)  25 MG tablet TAKE 1 TABLET (25 MG TOTAL) BY MOUTH AT BEDTIME. 30 tablet 3  . topiramate (TOPAMAX) 50 MG tablet TAKE 1 TABLET BY MOUTH TWICE DAILY 60 tablet 3  . valsartan-hydrochlorothiazide (DIOVAN-HCT) 160-25 MG tablet Take 1 tablet by mouth daily. 30 tablet 3  . zolpidem (AMBIEN) 5 MG tablet Take 1 tablet (5 mg total) by mouth at bedtime as needed for sleep. 15 tablet 0   No current facility-administered medications on file prior to visit.     BP 124/87   Pulse (!) 116   Temp 98.5 F (36.9 C) (Oral)   Resp 16   Ht 5\' 4"  (1.626 m)   Wt 187 lb 12.8 oz (85.2 kg)   LMP  (Exact Date)   SpO2 99%   BMI 32.24 kg/m       Objective:   Physical Exam  General Mental Status- Alert. General Appearance- Not in acute distress.   Skin Few scattered pimple like lesions. 3-4. No vesicles seen. Hard to notice had patient not pointed out. On both side of cheek. Not limited to one side.  Neck Carotid Arteries- Normal color. Moisture- Normal Moisture. No carotid bruits. No JVD.  Chest and Lung Exam Auscultation: Breath Sounds:-Normal.  Cardiovascular Auscultation:Rythm- Regular. Murmurs & Other Heart Sounds:Auscultation of the heart reveals- No Murmurs.  Abdomen Inspection:-Inspeection Normal. Palpation/Percussion:Note:No mass. Palpation and Percussion of the abdomen reveal- Non Tender, Non Distended + BS, no rebound or guarding.    Neurologic Cranial Nerve exam:- CN III-XII intact(No nystagmus), symmetric smile. Strength:- 5/5 equal and symmetric strength both upper and lower extremities.      Assessment & Plan:  For your work-related anxiety, I am prescribing clonazepam to use once daily.  For your insomnia I am refilling your Lunesta. But please remember that I do not want you to take both clonazepam and Lunesta at the same time. Clonazepam will be for daytime anxiety and at least 6-8 hours apart from Janet Case.  You signed control medication contract today and gave  UDS.  For your minimal acne/pimple  on  chin, I am prescribing mupirocin. If you get a worse outbreak would consider oral medication/antibiotic.  Follow-up in 3 months or as needed.  Aeriel Boulay, Ramon Dredge, PA-C

## 2017-03-17 NOTE — Patient Instructions (Addendum)
For your work-related anxiety, I am prescribing clonazepam to use once daily as needed anxiety.  For your insomnia I am refilling your Lunesta. But please remember that I do not want you to take both clonazepam and Lunesta at the same time. Clonazepam will be for daytime anxiety and at least 6-8 hours apart from [place].  You signed control medication contract today and gave UDS.  For your minimal acne/pimple on chin, I am prescribing mupirocin. If you get a worse outbreak would consider oral medication/antibiotic.  Follow-up in 3 months or as needed.

## 2017-03-19 ENCOUNTER — Ambulatory Visit: Payer: Self-pay | Admitting: Medical

## 2017-03-23 ENCOUNTER — Encounter: Payer: Self-pay | Admitting: Medical

## 2017-03-28 ENCOUNTER — Telehealth: Payer: Self-pay | Admitting: Medical

## 2017-03-28 NOTE — Telephone Encounter (Signed)
Pt did not give uds on day of last visit but was advised to give and the order was placed.  Ask her what happened and why did not do uds? She will need to give uds when in for next visit.

## 2017-03-30 ENCOUNTER — Other Ambulatory Visit: Payer: Self-pay

## 2017-03-30 MED ORDER — TOPIRAMATE 50 MG PO TABS: 50.0000 mg | ORAL_TABLET | Freq: Two times a day (BID) | ORAL | 2 refills | Status: DC

## 2017-03-30 MED FILL — TOPIRAMATE 50 MG TABLET: 50 | 30 days supply | Qty: 60 | Fill #0

## 2017-04-01 ENCOUNTER — Other Ambulatory Visit: Payer: Self-pay | Admitting: Medical

## 2017-04-01 MED ORDER — MUPIROCIN 2 % EX OINT: TOPICAL_OINTMENT | CUTANEOUS | 0 refills | Status: DC

## 2017-04-01 NOTE — Telephone Encounter (Signed)
Faxed Bactroban for acne/thx dmf

## 2017-04-02 MED FILL — ESZOPICLONE 3 MG TABS: 3 | 15 days supply | Qty: 15 | Fill #1

## 2017-04-07 ENCOUNTER — Ambulatory Visit (HOSPITAL_BASED_OUTPATIENT_CLINIC_OR_DEPARTMENT_OTHER)
Admission: RE | Admit: 2017-04-07 | Discharge: 2017-04-07 | Disposition: A | Discharge status: Home / Self Care | Payer: 59 | Source: Ambulatory Visit | Attending: Medical | Admitting: Medical

## 2017-04-07 ENCOUNTER — Encounter: Payer: Self-pay | Admitting: Medical

## 2017-04-07 ENCOUNTER — Ambulatory Visit (INDEPENDENT_AMBULATORY_CARE_PROVIDER_SITE_OTHER): Payer: 59 | Admitting: Medical

## 2017-04-07 VITALS — BP 113/78 | HR 102 | Temp 98.2°F | Resp 16 | Ht 64.0 in | Wt 198.0 lb

## 2017-04-07 DIAGNOSIS — M79671 Pain in right foot: Secondary | ICD-10-CM | POA: Diagnosis not present

## 2017-04-07 DIAGNOSIS — M79641 Pain in right hand: Secondary | ICD-10-CM | POA: Insufficient documentation

## 2017-04-07 DIAGNOSIS — M2550 Pain in unspecified joint: Secondary | ICD-10-CM

## 2017-04-07 MED ORDER — DICLOFENAC SODIUM 75 MG PO TBEC: 75.0000 mg | DELAYED_RELEASE_TABLET | Freq: Two times a day (BID) | ORAL | 0 refills | Status: DC

## 2017-04-07 MED FILL — DICLOFENAC SOD EC 75 MG TAB: 75 | 10 days supply | Qty: 20 | Fill #0

## 2017-04-07 NOTE — Patient Instructions (Signed)
For your arthralgias, I put in lab order for arthritis panel and uric acid level.  For pain and inflammation will prescribe diclofenac. Stop any over-the-counter NSAIDs.  For right hand pain will get x-ray and evaluate the joints. Particularly see if radiologist makes abnormal comments about index finger.  For foot pain, will get x-ray but since there is obvious mass/possible cyst will refer to podiatrist as well.  Follow-up in 7-10 days or as needed.

## 2017-04-07 NOTE — Progress Notes (Signed)
Subjective:    Patient ID: Janet Case, female    DOB: 1960/10/10, 56 y.o.   MRN: [id number]  HPI  Pt in for some hand pain/her finger joints but more pain rt index fingers. Left hand hurts occasional but not much. More the rt hand.   On review no elbow, wrist or hip pains.  Also some rt foot arch pain with small lump. Lump on rt foot causes pain on and off/transiently. Lump has been present for 2-3 years. More frequent pain in arch over past month.    Review of Systems  Constitutional: Negative for chills, fatigue and fever.  HENT: Negative for dental problem and drooling.   Eyes: Negative for photophobia, pain and redness.  Respiratory: Negative for cough, chest tightness, shortness of breath and wheezing.   Cardiovascular: Negative for chest pain and palpitations.  Gastrointestinal: Negative for abdominal pain.  Musculoskeletal:       See hpi.  Rt foot pain.  Skin: Negative for rash.  Neurological: Negative for dizziness and headaches.  Hematological: Negative for adenopathy. Does not bruise/bleed easily.  Psychiatric/Behavioral: Positive for sleep disturbance. Negative for behavioral problems, confusion, decreased concentration and suicidal ideas. The patient is nervous/anxious.        Often work related.    Past Medical History:  Diagnosis Date  . Anxiety   . Clonic hemifacial spasm    Left Facial  . Colon polyps   . Depression   . Hypertension   . Muscle spasms of head and/or neck      Social History   Social History  . Marital status: Divorced    Spouse name: N/A  . Number of children: 2  . Years of education: N/A   Occupational History  . Customer Service Representative     [employer]   Social History Main Topics  . Smoking status: Never Smoker  . Smokeless tobacco: Never Used  . Alcohol use No  . Drug use: No  . Sexual activity: No   Other Topics Concern  . Not on file   Social History Narrative  . No narrative on file    Past Surgical  History:  Procedure Laterality Date  . POLYPECTOMY     Oral  . TUBAL LIGATION    . VAGINAL WOUND CLOSURE / REPAIR  1977  . WISDOM TOOTH EXTRACTION      Family History  Problem Relation Age of Onset  . Hyperlipidemia Mother 17       Deceased  . Heart disease Mother   . Congestive Heart Failure Mother   . Hypertension Mother   . Kidney disease Mother   . Diabetes Mother   . Dementia Mother   . Heart defect Mother   . Kidney disease Father 59       Deceased  . Heart attack Father   . Hypertension Sister   . Diabetes Sister   . Breast cancer Sister   . Depression Sister   . Anxiety disorder Sister   . Thyroid cancer Sister        Thyroid Removed  . Leukemia Maternal Aunt   . Allergies Daughter   . GI problems Daughter     Allergies  Allergen Reactions  . Influenza Vaccines Nausea Only    Current Outpatient Prescriptions on File Prior to Visit  Medication Sig Dispense Refill  . amLODipine (NORVASC) 10 MG tablet TAKE 1 TABLET (10 MG TOTAL) BY MOUTH DAILY. 30 tablet 5  . azithromycin (ZITHROMAX) 250 MG tablet Take  2 tablets by mouth on day 1, followed by 1 tablet by mouth daily for 4 days. 6 tablet 0  . benzonatate (TESSALON) 100 MG capsule Take 1 capsule (100 mg total) by mouth 3 (three) times daily as needed for cough. 21 capsule 0  . clonazePAM (KLONOPIN) 0.5 MG tablet 1 tablet by mouth daily when necessary anxiety 30 tablet 3  . Eszopiclone 3 MG TABS Take 1 tablet (3 mg total) by mouth at bedtime. Take immediately before bedtime 15 tablet 3  . fluticasone (FLONASE) 50 MCG/ACT nasal spray Place 2 sprays into both nostrils daily. 16 g 1  . fluticasone (FLOVENT HFA) 110 MCG/ACT inhaler Inhale 2 puffs into the lungs 2 (two) times daily. 1 Inhaler 2  . guaiFENesin-codeine 100-10 MG/5ML syrup Take 10 mLs by mouth every 6 (six) hours as needed. 120 mL 0  . metroNIDAZOLE (FLAGYL) 500 MG tablet Take 1 tablet (500 mg total) by mouth 2 (two) times daily. 14 tablet 0  . mupirocin  ointment (BACTROBAN) 2 % Apply thin film twice daily 22 g 0  . sertraline (ZOLOFT) 25 MG tablet TAKE 1 TABLET (25 MG TOTAL) BY MOUTH AT BEDTIME. 30 tablet 3  . topiramate (TOPAMAX) 50 MG tablet Take 1 tablet (50 mg total) by mouth 2 (two) times daily. 60 tablet 2  . valsartan-hydrochlorothiazide (DIOVAN-HCT) 160-25 MG tablet Take 1 tablet by mouth daily. 30 tablet 3   No current facility-administered medications on file prior to visit.     BP 113/78   Pulse (!) 102   Temp 98.2 F (36.8 C) (Oral)   Resp 16   Ht  (1.626 m)   Wt 198 lb (89.8 kg)   SpO2 96%   BMI 33.99 kg/m       Objective:   Physical Exam  General Mental Status- Alert. General Appearance- Not in acute distress.   Skin General: Color- Normal Color. Moisture- Normal Moisture.  Neck Carotid Arteries- Normal color. Moisture- Normal Moisture. No carotid bruits. No JVD.  Chest and Lung Exam Auscultation: Breath Sounds:-Normal.  Cardiovascular Auscultation:Rythm- Regular. Murmurs & Other Heart Sounds:Auscultation of the heart reveals- No Murmurs.  Neurologic Cranial Nerve exam:- CN III-XII intact(No nystagmus), symmetric smile. Strength:- 5/5 equal and symmetric strength both upper and lower extremities.  Rt foot- on arch palpation moderate sized mid region cyst like mass. Mild tender.  Bilateral hands- mild pip joint swelling. Mild pain on flexion and extension. Rt index finger hurts worse. No warmth.      Assessment & Plan:  For your arthralgias, I put in lab order for arthritis panel and uric acid level.  For pain and inflammation will prescribe diclofenac. Stop any over-the-counter NSAIDs.  For right hand pain will get x-ray and evaluate the joints. Particularly see if radiologist makes abnormal comments about index finger.  For foot pain, will get x-ray but since there is obvious mass/possible cyst will refer to podiatrist as well.  Follow-up in 7-10 days or as needed.  Note I did review  patient's control medication narcs care report. Also reviewed her recent UDS which she gave on the day she signed the contract. The patient was not clonazepam at that time. Patient in the past and gotten prescriptions from former provider that was in our office. She was getting Ambien from him at that time. Looks like in the past she also got lunests from another provider for she started seeing me regularly. When she is in for official follow-up for refills of controlled medications,  I will remind her that she should not get any controlled medications for many went outside this office. No you suggested some the contract recently.  Rangel Echeverri, Ramon Dredge, PA-C

## 2017-04-08 LAB — RHEUMATOID FACTOR: Rhuematoid fact SerPl-aCnc: <14 IU/mL (ref <14)

## 2017-04-08 LAB — URIC ACID: URIC ACID, SERUM: 6.1 mg/dL (ref 2.4–7.0)

## 2017-04-08 LAB — ANA: Anti Nuclear Antibody(ANA): POSITIVE — AB

## 2017-04-08 LAB — ANTI-NUCLEAR AB-TITER (ANA TITER): ANA Titer 1: 1:40 {titer} — ABNORMAL HIGH

## 2017-04-08 LAB — SEDIMENTATION RATE: Sed Rate: 10 mm/hr (ref 0–30)

## 2017-04-08 LAB — C-REACTIVE PROTEIN: CRP: 0.8 mg/dL (ref 0.5–20.0)

## 2017-04-08 NOTE — Telephone Encounter (Signed)
Pt gave UDS 9/5/8

## 2017-04-09 NOTE — Progress Notes (Signed)
Mailed a letter stating results and questions from provider after attempting to contact several times/thx dmf

## 2017-04-10 ENCOUNTER — Telehealth: Payer: Self-pay | Admitting: Medical

## 2017-04-10 DIAGNOSIS — M25542 Pain in joints of left hand: Secondary | ICD-10-CM

## 2017-04-10 DIAGNOSIS — R768 Other specified abnormal immunological findings in serum: Secondary | ICD-10-CM

## 2017-04-10 DIAGNOSIS — M25541 Pain in joints of right hand: Secondary | ICD-10-CM

## 2017-04-10 NOTE — Telephone Encounter (Signed)
  Referral to rheumatologist placed. 

## 2017-04-16 ENCOUNTER — Encounter: Payer: Self-pay | Admitting: Medical

## 2017-04-19 ENCOUNTER — Telehealth: Payer: Self-pay | Admitting: Medical

## 2017-04-19 MED ORDER — MELOXICAM 7.5 MG PO TABS: ORAL_TABLET | ORAL | 0 refills | Status: DC

## 2017-04-19 NOTE — Telephone Encounter (Signed)
Notified pt. 

## 2017-04-19 NOTE — Telephone Encounter (Signed)
Advise pt she can stop diclofenac and try meloxicam.

## 2017-04-21 MED FILL — ESZOPICLONE 3 MG TABS: 3 | 15 days supply | Qty: 15 | Fill #2

## 2017-05-10 ENCOUNTER — Other Ambulatory Visit: Payer: Self-pay | Admitting: Medical

## 2017-05-10 MED ORDER — MELOXICAM 7.5 MG PO TABS: 7.5000 mg | ORAL_TABLET | Freq: Every day | ORAL | 0 refills | Status: DC

## 2017-05-11 ENCOUNTER — Ambulatory Visit: Payer: 59 | Admitting: Podiatry

## 2017-05-13 ENCOUNTER — Ambulatory Visit (INDEPENDENT_AMBULATORY_CARE_PROVIDER_SITE_OTHER): Payer: 59 | Admitting: Podiatry

## 2017-05-13 ENCOUNTER — Ambulatory Visit (INDEPENDENT_AMBULATORY_CARE_PROVIDER_SITE_OTHER): Payer: 59

## 2017-05-13 ENCOUNTER — Encounter: Payer: Self-pay | Admitting: Podiatry

## 2017-05-13 VITALS — BP 133/104 | HR 105 | Resp 16

## 2017-05-13 DIAGNOSIS — M7751 Other enthesopathy of right foot: Secondary | ICD-10-CM

## 2017-05-13 DIAGNOSIS — M779 Enthesopathy, unspecified: Principal | ICD-10-CM

## 2017-05-13 DIAGNOSIS — M778 Other enthesopathies, not elsewhere classified: Secondary | ICD-10-CM

## 2017-05-13 DIAGNOSIS — M722 Plantar fascial fibromatosis: Secondary | ICD-10-CM | POA: Diagnosis not present

## 2017-05-13 NOTE — Progress Notes (Signed)
Subjective:  Patient ID: Janet Case, female    DOB: 07-Apr-1961,  MRN: [id number] HPI Chief Complaint  Patient presents with  . Foot Pain    Arch right - knot x 2-3 months, sharp shooting pains occasionally, tried Tylenol-temp relief    56 y.o. female presents with the above complaint.     Past Medical History:  Diagnosis Date  . Anxiety   . Clonic hemifacial spasm    Left Facial  . Colon polyps   . Depression   . Hypertension   . Muscle spasms of head and/or neck    Past Surgical History:  Procedure Laterality Date  . POLYPECTOMY     Oral  . TUBAL LIGATION    . VAGINAL WOUND CLOSURE / REPAIR  1977  . WISDOM TOOTH EXTRACTION      Current Outpatient Prescriptions:  .  Eszopiclone (LUNESTA PO), Take by mouth., Disp: , Rfl:  .  amLODipine (NORVASC) 10 MG tablet, TAKE 1 TABLET (10 MG TOTAL) BY MOUTH DAILY., Disp: 30 tablet, Rfl: 5 .  clonazePAM (KLONOPIN) 0.5 MG tablet, 1 tablet by mouth daily when necessary anxiety, Disp: 30 tablet, Rfl: 3 .  fluticasone (FLOVENT HFA) 110 MCG/ACT inhaler, Inhale 2 puffs into the lungs 2 (two) times daily., Disp: 1 Inhaler, Rfl: 2 .  meloxicam (MOBIC) 7.5 MG tablet, Take 1-2 tablets (7.5-15 mg total) by mouth daily., Disp: 30 tablet, Rfl: 0 .  mupirocin ointment (BACTROBAN) 2 %, Apply thin film twice daily, Disp: 22 g, Rfl: 0 .  topiramate (TOPAMAX) 50 MG tablet, Take 1 tablet (50 mg total) by mouth 2 (two) times daily., Disp: 60 tablet, Rfl: 2 .  valsartan-hydrochlorothiazide (DIOVAN-HCT) 160-25 MG tablet, Take 1 tablet by mouth daily., Disp: 30 tablet, Rfl: 3  Allergies  Allergen Reactions  . Influenza Vaccines Nausea Only   Review of Systems  All other systems reviewed and are negative.  Objective:   Vitals:   05/13/17 1016  BP: (!) 133/104  Pulse: (!) 105  Resp: 16    General: Well developed, nourished, in no acute distress, alert and oriented x3   Dermatological: Skin is warm, dry and supple bilateral. Nails x 10 are  well maintained; remaining integument appears unremarkable at this time. There are no open sores, no preulcerative lesions, no rash or signs of infection present.  Vascular: Dorsalis Pedis artery and Posterior Tibial artery pedal pulses are 2/4 bilateral with immedate capillary fill time. Pedal hair growth present. No varicosities and no lower extremity edema present bilateral.   Neruologic: Grossly intact via light touch bilateral. Vibratory intact via tuning fork bilateral. Protective threshold with Semmes Wienstein monofilament intact to all pedal sites bilateral. Patellar and Achilles deep tendon reflexes 2+ bilateral. No Babinski or clonus noted bilateral.   Musculoskeletal: No gross boney pedal deformities bilateral. No pain, crepitus, or limitation noted with foot and ankle range of motion bilateral. Muscular strength 5/5 in all groups tested bilateral. Nonpulsatile but firm mass medial longitudinal arch of the right foot in the medial slip of the plantar fascia. This appears to be a plantar fibroma tender on palpation. She also has multiple nodule to the medial band of plantar fascia on the left foot which she had not noticed. She also has early Dupuytren's contractures ring finger left hand which she had not noticed.  Gait: Unassisted, Nonantalgic.    Radiographs:  Demonstrated soft tissue nodule central arch right foot without calcification. 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  Plan: Plantar fibromatosis was injected today Kenalog and local anesthetic. Discussed the possible need for surgical intervention.     Norris Brumbach T. [place], [place]

## 2017-05-14 MED FILL — SERTRALINE HCL 25 MG TABLET: 25 | 30 days supply | Qty: 30 | Fill #1

## 2017-05-14 MED FILL — ESZOPICLONE 3 MG TABS: 3 | 15 days supply | Qty: 15 | Fill #3

## 2017-05-14 MED FILL — clonazePAM 0.5 MG TABS: 0.5 | 30 days supply | Qty: 30 | Fill #1

## 2017-05-14 MED FILL — TOPIRAMATE 50 MG TABLET: 50 | 30 days supply | Qty: 60 | Fill #1

## 2017-05-16 ENCOUNTER — Encounter: Payer: Self-pay | Admitting: Medical

## 2017-05-18 ENCOUNTER — Encounter: Payer: Self-pay | Admitting: Medical

## 2017-05-21 ENCOUNTER — Telehealth: Payer: Self-pay | Admitting: *Deleted

## 2017-05-21 NOTE — Telephone Encounter (Signed)
Received FMLA/STD [facility]; completed as much as possible; forwarded to provider/SLS 11/09

## 2017-05-21 NOTE — Telephone Encounter (Signed)
Janet Case did you receive fmla paperwork on this patient?

## 2017-05-24 ENCOUNTER — Telehealth: Payer: Self-pay | Admitting: Medical

## 2017-05-24 ENCOUNTER — Encounter: Payer: Self-pay | Admitting: Medical

## 2017-05-24 MED ORDER — DOXEPIN HCL 3 MG PO TABS: ORAL_TABLET | ORAL | 3 refills | Status: DC

## 2017-05-24 NOTE — Telephone Encounter (Signed)
rx silenor sent to pharmacy.

## 2017-05-25 ENCOUNTER — Telehealth: Payer: Self-pay | Admitting: Medical

## 2017-05-25 NOTE — Telephone Encounter (Signed)
We are almost finished filling out her FMLA form.  However there are some questions that I want to go over in the office to complete the form correctly.  Also we had been my charting back and forth about insomnia medication.  We can discuss that on the visit as well.  If it might take a full 30-minute slot.  See if she is okay with last 2 appointments slots on Friday afternoon.

## 2017-05-27 ENCOUNTER — Telehealth: Payer: Self-pay | Admitting: Medical

## 2017-05-27 MED ORDER — ZALEPLON 10 MG PO CAPS: 10.0000 mg | ORAL_CAPSULE | Freq: Every evening | ORAL | 0 refills | Status: DC | PRN

## 2017-05-27 NOTE — Telephone Encounter (Signed)
Open to prescribe [employer]

## 2017-05-31 NOTE — Telephone Encounter (Signed)
Please call and schedule appointment.

## 2017-06-02 NOTE — Telephone Encounter (Signed)
lvm advising patient of message below °

## 2017-06-03 ENCOUNTER — Encounter: Payer: Self-pay | Admitting: Medical

## 2017-06-08 ENCOUNTER — Other Ambulatory Visit: Payer: Self-pay | Admitting: Medical

## 2017-06-08 MED ORDER — MELOXICAM 7.5 MG PO TABS: 7.5000 mg | ORAL_TABLET | Freq: Every day | ORAL | 0 refills | Status: DC

## 2017-06-10 ENCOUNTER — Encounter: Payer: Self-pay | Admitting: Medical

## 2017-06-10 ENCOUNTER — Ambulatory Visit (INDEPENDENT_AMBULATORY_CARE_PROVIDER_SITE_OTHER): Payer: 59 | Admitting: Medical

## 2017-06-10 VITALS — BP 130/80 | HR 78 | Temp 97.7°F | Resp 16 | Wt 202.0 lb

## 2017-06-10 DIAGNOSIS — F419 Anxiety disorder, unspecified: Secondary | ICD-10-CM

## 2017-06-10 MED ORDER — VALSARTAN-HYDROCHLOROTHIAZIDE 160-25 MG PO TABS: 1.0000 | ORAL_TABLET | Freq: Every day | ORAL | 3 refills | Status: DC

## 2017-06-10 NOTE — Progress Notes (Signed)
Subjective:    Patient ID: Janet Case, female    DOB: September 24, 1960, 56 y.o.   MRN: [id number]  HPI   Pt in for a follow up.  She is still having some anxiety during the day and at times is associated with work. Pt states recent stress increase related to her sister moving in to her house. This stress her out.She asked me to give her to have 2 episodes per week. Each episode lasting 3 days. We had discussion that this is equivalent to 6 days per week. She states she has talked with HR and they advised for me to write it like this.  She also wants me to write excuse November 1, 9, and 15th.    Review of Systems  Constitutional: Negative for activity change, chills, fatigue and fever.  Respiratory: Negative for cough, chest tightness, shortness of breath and wheezing.   Cardiovascular: Negative for chest pain and palpitations.  Gastrointestinal: Negative for abdominal pain.  Musculoskeletal: Negative for back pain.  Neurological: Negative for dizziness, weakness and headaches.  Hematological: Negative for adenopathy. Does not bruise/bleed easily.  Psychiatric/Behavioral: Negative for agitation, behavioral problems, decreased concentration, dysphoric mood, self-injury, sleep disturbance and suicidal ideas. The patient is nervous/anxious.     Past Medical History:  Diagnosis Date  . Anxiety   . Clonic hemifacial spasm    Left Facial  . Colon polyps   . Depression   . Hypertension   . Muscle spasms of head and/or neck      Social History   Socioeconomic History  . Marital status: Divorced    Spouse name: Not on file  . Number of children: 2  . Years of education: Not on file  . Highest education level: Not on file  Social Needs  . Financial resource strain: Not on file  . Food insecurity - worry: Not on file  . Food insecurity - inability: Not on file  . Transportation needs - medical: Not on file  . Transportation needs - non-medical: Not on file  Occupational History    . Occupation: Occupational psychologistCustomer Service Representative    Comment: [employer]  Tobacco Use  . Smoking status: Never Smoker  . Smokeless tobacco: Never Used  Substance and Sexual Activity  . Alcohol use: No    Alcohol/week: 0.0 oz  . Drug use: No  . Sexual activity: No  Other Topics Concern  . Not on file  Social History Narrative  . Not on file    Past Surgical History:  Procedure Laterality Date  . POLYPECTOMY     Oral  . TUBAL LIGATION    . VAGINAL WOUND CLOSURE / REPAIR  1977  . WISDOM TOOTH EXTRACTION      Family History  Problem Relation Age of Onset  . Hyperlipidemia Mother 3871       Deceased  . Heart disease Mother   . Congestive Heart Failure Mother   . Hypertension Mother   . Kidney disease Mother   . Diabetes Mother   . Dementia Mother   . Heart defect Mother   . Kidney disease Father [id number]       Deceased  . Heart attack Father   . Hypertension Sister   . Diabetes Sister   . Breast cancer Sister   . Depression Sister   . Anxiety disorder Sister   . Thyroid cancer Sister        Thyroid Removed  . Leukemia Maternal Aunt   . Allergies Daughter   .  GI problems Daughter     Allergies  Allergen Reactions  . Influenza Vaccines Nausea Only    Current Outpatient Medications on File Prior to Visit  Medication Sig Dispense Refill  . amLODipine (NORVASC) 10 MG tablet TAKE 1 TABLET (10 MG TOTAL) BY MOUTH DAILY. 30 tablet 5  . clonazePAM (KLONOPIN) 0.5 MG tablet 1 tablet by mouth daily when necessary anxiety 30 tablet 3  . fluticasone (FLOVENT HFA) 110 MCG/ACT inhaler Inhale 2 puffs into the lungs 2 (two) times daily. 1 Inhaler 2  . meloxicam (MOBIC) 7.5 MG tablet Take 1-2 tablets (7.5-15 mg total) by mouth daily. 30 tablet 0  . mupirocin ointment (BACTROBAN) 2 % Apply thin film twice daily 22 g 0  . topiramate (TOPAMAX) 50 MG tablet Take 1 tablet (50 mg total) by mouth 2 (two) times daily. 60 tablet 2  . valsartan-hydrochlorothiazide (DIOVAN-HCT) 160-25 MG tablet Take 1  tablet by mouth daily. 30 tablet 3  . zaleplon (SONATA) 10 MG capsule Take 1 capsule (10 mg total) at bedtime as needed by mouth for sleep. 30 capsule 0   No current facility-administered medications on file prior to visit.     BP (!) 133/91   Pulse 78   Temp 97.7 F (36.5 C) (Oral)   Resp 16   Wt 202 lb (91.6 kg)   SpO2 100%   BMI 34.67 kg/m       Objective:   Physical Exam  General Mental Status- Alert. General Appearance- Not in acute distress.   Skin General: Color- Normal Color. Moisture- Normal Moisture.  Neck Carotid Arteries- Normal color. Moisture- Normal Moisture. No carotid bruits. No JVD.  Chest and Lung Exam Auscultation: Breath Sounds:-Normal.  Cardiovascular Auscultation:Rythm- Regular. Murmurs & Other Heart Sounds:Auscultation of the heart reveals- No Murmurs.  Abdomen Inspection:-Inspeection Normal. Palpation/Percussion:Note:No mass. Palpation and Percussion of the abdomen reveal- Non Tender, Non Distended + BS, no rebound or guarding.    Neurologic Cranial Nerve exam:- CN III-XII intact(No nystagmus), symmetric smile. Drift Test:- No drift. Romberg Exam:- Negative.  Heal to Toe Gait exam:-Normal. Finger to Nose:- Normal/Intact Strength:- 5/5 equal and symmetric strength both upper and lower extremities.      Assessment & Plan:  For your anxiety could continue clonazepam. I also did fill out your fmla form.   If you anxiety is worsening please let me know.  Follow up 1-2 months for CPE. Come in fasting.  Janet Case, Ramon DredgeEdward, PA-C

## 2017-06-10 NOTE — Patient Instructions (Addendum)
For your anxiety could continue clonazepam. I also did fill out your fmla form.   If you anxiety is worsening please let me know.  Follow up 1-2 months for CPE. Come in fasting.

## 2017-06-11 ENCOUNTER — Encounter: Payer: Self-pay | Admitting: Podiatry

## 2017-06-17 ENCOUNTER — Encounter: Payer: Self-pay | Admitting: Medical

## 2017-06-17 ENCOUNTER — Telehealth: Payer: Self-pay | Admitting: Medical

## 2017-06-17 NOTE — Telephone Encounter (Signed)
fmla paperwork Question 7: The provider indicated you will need appointments in the amount of "2" appointments per "1"  weeks, but did not indicate a duration of appointments.  Please have the provider review and update the frequency and duration of appointments. If you will need to  attend appointments at multiple providers for the condition, make sure those are included in the answer.  Please ensure the provider includes travel and wait time when estimating how long each appointment could  last. Example: 2 appointments per 1 month lasting 8 hours  I have until 06/29/2017 to return them   The above is the message patient said to me about her FMLA form.  This is a bit confusing and I do not fully understand what she needs.  Do we have the last at some FMLA form I filled out.  If so we make a copy and give it to me so I can review.  If I cannot figure out what is necessary she might need to come in for a might need a number of human resources to clarify this request.

## 2017-06-18 NOTE — Telephone Encounter (Signed)
Paperwork forwarded back to provider to make needed changes/SLS 12/07

## 2017-06-24 ENCOUNTER — Telehealth: Payer: Self-pay | Admitting: *Deleted

## 2017-06-24 MED ORDER — IBUPROFEN 800 MG PO TABS: 800.0000 mg | ORAL_TABLET | Freq: Three times a day (TID) | ORAL | 0 refills | Status: DC | PRN

## 2017-06-24 NOTE — Telephone Encounter (Signed)
Pt had asked for information concerning antiinflammatory medications and Dr. Al CorpusHyatt ordered pt to take ibuprofen 600mg  or 800mg  tid. Pt asked if it was a OTC prescription, I emailed to MyChart that I could order through her pharmacy, and she could compare cost between insurance coverage and OTC.

## 2017-06-28 MED FILL — SERTRALINE HCL 25 MG TABLET: 25 | 30 days supply | Qty: 30 | Fill #2

## 2017-06-28 MED FILL — clonazePAM 0.5 MG TABS: 0.5 | 30 days supply | Qty: 30 | Fill #2

## 2017-06-28 MED FILL — TOPIRAMATE 50 MG TABLET: 50 | 30 days supply | Qty: 60 | Fill #2

## 2017-06-29 ENCOUNTER — Telehealth: Payer: Self-pay | Admitting: Medical

## 2017-06-29 NOTE — Telephone Encounter (Signed)
Completed paperwork forwarded to me via provider; faxed with confirmation received and filed/SLS 12/18

## 2017-06-29 NOTE — Telephone Encounter (Signed)
I updated page 7 and put 4 hours for each appointment. Please make sure faxed today. 06-29-2017 deadline. Keep fax confirmation.

## 2017-06-29 NOTE — Telephone Encounter (Signed)
You 1 minute ago (12:40 PM)      Completed paperwork forwarded to me via provider; faxed with confirmation received and filed/SLS 12/18      Documentation     Saguier, Ramon DredgeEdward, PA-C routed conversation to You; Orlene Ochorrence, Jasmine N, CMA 20 minutes ago (12:21 PM)    Saguier, Ramon DredgeEdward, PA-C 21 minutes ago (12:20 PM)      I updated page 7 and put 4 hours for each appointment. Please make sure faxed today. 06-29-2017 deadline. Keep fax confirmation.      Documentation

## 2017-07-05 ENCOUNTER — Other Ambulatory Visit: Payer: Self-pay | Admitting: Medical

## 2017-07-08 ENCOUNTER — Other Ambulatory Visit: Payer: Self-pay | Admitting: Medical

## 2017-07-11 ENCOUNTER — Other Ambulatory Visit: Payer: Self-pay | Admitting: Medical

## 2017-07-12 ENCOUNTER — Other Ambulatory Visit: Payer: Self-pay | Admitting: Physician Assistant

## 2017-07-12 MED ORDER — ZALEPLON 10 MG PO CAPS: 10.0000 mg | ORAL_CAPSULE | Freq: Every evening | ORAL | 0 refills | Status: DC | PRN

## 2017-07-12 NOTE — Telephone Encounter (Signed)
Pt requesting refill on zaleplon (Sonata) 10mg .   Last OV: 06/10/2017  Last Fill: 05/27/2017 #30 and 0RF   Please advise.

## 2017-07-12 NOTE — Telephone Encounter (Signed)
I printed prescription of sonata. Signed and placed on your desk. Will you fax it to the pharmacy on Wednesday.

## 2017-07-14 NOTE — Telephone Encounter (Signed)
Refilled pt bp meds today. Notify pt.

## 2017-07-14 NOTE — Telephone Encounter (Signed)
Will defer further refills of patient's medications to PCP  

## 2017-07-15 ENCOUNTER — Encounter: Payer: Self-pay | Admitting: Podiatry

## 2017-07-15 ENCOUNTER — Ambulatory Visit (INDEPENDENT_AMBULATORY_CARE_PROVIDER_SITE_OTHER): Payer: 59 | Admitting: Podiatry

## 2017-07-15 DIAGNOSIS — M722 Plantar fascial fibromatosis: Secondary | ICD-10-CM

## 2017-07-17 NOTE — Progress Notes (Signed)
She presents today for follow-up of the fibroma plantar aspect of the foot she states that the fibroma seems to be doing better but I got a little crusty spot on the bottom of the foot.  This gives me sharp shooting pains occasionally.  Objective: Vital signs are stable she is alert and oriented x3 plantar fibroma has decreased considerably in size and in and of itself is no longer tender on palpation.  There is there is tenderness on an area of skin breakdown less than the size of a dime which appears to be superficial with superficial cracking.  This is more than likely associated with the injection site and extrusion of some of the cortisone.  Assessment: Well-healing plantar fibroma with dermatitis and cracking of the skin associated with Kenalog.  Plan: Instructed her to keep the area clean and keep a small amount of Aquaphor ointment or a Band-Aid on the area and I will follow-up with her should this worsen.  Otherwise she will notify me with when her plantar fibroma worsens or if it does not resolve completely.

## 2017-07-19 ENCOUNTER — Telehealth: Payer: Self-pay

## 2017-07-19 NOTE — Telephone Encounter (Signed)
PA denied, however looks like Pt no longer on medication.    Request Reference Number: [id number]. SILENOR TAB 3MG  is denied for not meeting the prior authorization requirement(s). For further questions, call [phone]. Appeals are not supported through ePA. Please refer to the fax case notice for appeals information and instructions.

## 2017-07-19 NOTE — Telephone Encounter (Signed)
PA initiated via Covermymeds; KEY: [id number]. Awaiting determination.

## 2017-07-20 ENCOUNTER — Other Ambulatory Visit: Payer: Self-pay | Admitting: Podiatry

## 2017-08-02 ENCOUNTER — Other Ambulatory Visit: Payer: Self-pay | Admitting: Medical

## 2017-08-03 ENCOUNTER — Telehealth: Payer: Self-pay | Admitting: Medical

## 2017-08-03 ENCOUNTER — Other Ambulatory Visit: Payer: Self-pay | Admitting: Medical

## 2017-08-03 NOTE — Telephone Encounter (Signed)
Copied from CRM [phone]. Topic: Quick Communication - Other Results >> Aug 03, 2017 12:23 PM Valentina LucksMatos, Jackelin wrote: Pt never picked up rx Ambien 5 mg tablet 15 tablets since June 23-2018. Rx had contract attached that was not filled out - both documents shredded.

## 2017-08-03 NOTE — Telephone Encounter (Signed)
Ok to fill meloxicam 

## 2017-08-04 NOTE — Telephone Encounter (Signed)
Meloxicam refill sent to patient's pharmacy.

## 2017-08-17 ENCOUNTER — Other Ambulatory Visit: Payer: Self-pay | Admitting: Medical

## 2017-08-18 ENCOUNTER — Other Ambulatory Visit: Payer: Self-pay | Admitting: Podiatry

## 2017-08-18 NOTE — Telephone Encounter (Signed)
Rx of generic Sonata sent to patient's pharmacy.

## 2017-08-18 NOTE — Telephone Encounter (Signed)
Pt is requesting Sonata.

## 2017-08-23 ENCOUNTER — Encounter: Payer: Self-pay | Admitting: Medical

## 2017-08-25 NOTE — Progress Notes (Deleted)
Office Visit Note  Patient: Janet Case             Date of Birth: [birth year]           MRN: [id number]             PCP: Marisue BrooklynSaguier, Edward, PA-C Referring: Marisue BrooklynSaguier, Edward, PA-C Visit Date: 09/07/2017 Occupation: @GUAROCC @    Subjective:  No chief complaint on file.   History of Present Illness: Janet Case is a 57 y.o. female ***   Activities of Daily Living:  Patient reports morning stiffness for *** {minute/hour:19697}.   Patient {ACTIONS;DENIES/REPORTS:21021675::"Denies"} nocturnal pain.  Difficulty dressing/grooming: {ACTIONS;DENIES/REPORTS:21021675::"Denies"} Difficulty climbing stairs: {ACTIONS;DENIES/REPORTS:21021675::"Denies"} Difficulty getting out of chair: {ACTIONS;DENIES/REPORTS:21021675::"Denies"} Difficulty using hands for taps, buttons, cutlery, and/or writing: {ACTIONS;DENIES/REPORTS:21021675::"Denies"}   No Rheumatology ROS completed.   PMFS History:  Patient Active Problem List   Diagnosis Date Noted  . Hemifacial spasm 02/03/2016  . Thyroid nodule 02/03/2016  . Obstructive sleep apnea 01/25/2016  . Anxiety and depression 09/21/2015  . Cystocele 05/31/2015  . Rectocele 05/31/2015  . Physical exam 05/08/2015  . Essential hypertension 04/12/2015  . Facial twitching 04/12/2015  . Insomnia 04/12/2015  . 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 Family history of breast cancer 04/12/2015    Past Medical History:  Diagnosis Date  . Anxiety   . Clonic hemifacial spasm    Left Facial  . Colon polyps   . Depression   . Hypertension   . Muscle spasms of head and/or neck     Family History  Problem Relation Age of Onset  . Hyperlipidemia Mother 6471       Deceased  . Heart disease Mother   . Congestive Heart Failure Mother   . Hypertension Mother   . Kidney disease Mother   . Diabetes Mother   . Dementia Mother   . Heart defect Mother   . Kidney disease Father 1880       Deceased  . Heart attack Father   . Hypertension Sister   . Diabetes  Sister   . Breast cancer Sister   . Depression Sister   . Anxiety disorder Sister   . Thyroid cancer Sister        Thyroid Removed  . Leukemia Maternal Aunt   . Allergies Daughter   . GI problems Daughter    Past Surgical History:  Procedure Laterality Date  . POLYPECTOMY     Oral  . TUBAL LIGATION    . VAGINAL WOUND CLOSURE / REPAIR  1977  . WISDOM TOOTH EXTRACTION     Social History   Social History Narrative  . Not on file     Objective: Vital Signs: There were no vitals taken for this visit.   Physical Exam   Musculoskeletal Exam: ***  CDAI Exam: No CDAI exam completed.    Investigation: No additional findings. Component     Latest Ref Rng & Units 04/07/2017  ANA Pattern 1      HOMOGENEOUS (A)  ANA Titer 1     titer 1:40 (H)  Sed Rate     0 - 30 mm/hr 10  CRP     0.5 - 20.0 mg/dL 0.8  Anit Nuclear Antibody(ANA)     NEGATIVE POSITIVE (A)  RA Latex Turbid.     <14 IU/mL <14  Uric Acid, Serum     2.4 - 7.0 mg/dL 6.1    Imaging: No results found.  Speciality Comments: No specialty comments available.    Procedures:  No procedures performed Allergies: Influenza vaccines   Assessment / Plan:     Visit Diagnoses: No diagnosis found.    Orders: No orders of the defined types were placed in this encounter.  No orders of the defined types were placed in this encounter.   Face-to-face time spent with patient was *** minutes. 50% of time was spent in counseling and coordination of care.  Follow-Up Instructions: No Follow-up on file.   Ellen Henri, CMA  Note - This record has been created using Animal nutritionist.  Chart creation errors have been sought, but may not always  have been located. Such creation errors do not reflect on  the standard of medical care.

## 2017-08-25 NOTE — Telephone Encounter (Signed)
Document printed and [facility] to [facility]

## 2017-09-01 ENCOUNTER — Other Ambulatory Visit: Payer: Self-pay | Admitting: Podiatry

## 2017-09-01 MED FILL — clonazePAM 0.5 MG TABS: 0.5 | 30 days supply | Qty: 30 | Fill #3

## 2017-09-01 MED FILL — TOPIRAMATE 50 MG TABLET: 50 | 30 days supply | Qty: 60 | Fill #0

## 2017-09-01 MED FILL — SERTRALINE HCL 25 MG TABLET: 25 | 30 days supply | Qty: 30 | Fill #3

## 2017-09-07 ENCOUNTER — Ambulatory Visit: Payer: 59 | Admitting: Rheumatology

## 2017-09-08 NOTE — Telephone Encounter (Signed)
Received FMLA/STD paperwork from [employer], completed as much as possible; forwarded to provider/SLS 02/27

## 2017-09-14 ENCOUNTER — Other Ambulatory Visit: Payer: Self-pay | Admitting: Medical

## 2017-09-17 NOTE — Progress Notes (Deleted)
Office Visit Note  Patient: Janet Case             Date of Birth: Nov 07, 1960           MRN: [id number]             PCP: Marisue Brooklyn Referring: Marisue Brooklyn Visit Date: 10/01/2017 Occupation: @GUAROCC @    Subjective:  No chief complaint on file.   History of Present Illness: Janet Case is a 57 y.o. female ***   Activities of Daily Living:  Patient reports morning stiffness for *** {minute/hour:19697}.   Patient {ACTIONS;DENIES/REPORTS:21021675::"Denies"} nocturnal pain.  Difficulty dressing/grooming: {ACTIONS;DENIES/REPORTS:21021675::"Denies"} Difficulty climbing stairs: {ACTIONS;DENIES/REPORTS:21021675::"Denies"} Difficulty getting out of chair: {ACTIONS;DENIES/REPORTS:21021675::"Denies"} Difficulty using hands for taps, buttons, cutlery, and/or writing: {ACTIONS;DENIES/REPORTS:21021675::"Denies"}   No Rheumatology ROS completed.   PMFS History:  Patient Active Problem List   Diagnosis Date Noted  . Hemifacial spasm 02/03/2016  . Thyroid nodule 02/03/2016  . Obstructive sleep apnea 01/25/2016  . Anxiety and depression 09/21/2015  . Cystocele 05/31/2015  . Rectocele 05/31/2015  . Physical exam 05/08/2015  . Essential hypertension 04/12/2015  . Facial twitching 04/12/2015  . Insomnia 04/12/2015  . 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 Family history of breast cancer 04/12/2015    Past Medical History:  Diagnosis Date  . Anxiety   . Clonic hemifacial spasm    Left Facial  . Colon polyps   . Depression   . Hypertension   . Muscle spasms of head and/or neck     Family History  Problem Relation Age of Onset  . Hyperlipidemia Mother 72       Deceased  . Heart disease Mother   . Congestive Heart Failure Mother   . Hypertension Mother   . Kidney disease Mother   . Diabetes Mother   . Dementia Mother   . Heart defect Mother   . Kidney disease Father 24       Deceased  . Heart attack Father   . Hypertension Sister   . Diabetes  Sister   . Breast cancer Sister   . Depression Sister   . Anxiety disorder Sister   . Thyroid cancer Sister        Thyroid Removed  . Leukemia Maternal Aunt   . Allergies Daughter   . GI problems Daughter    Past Surgical History:  Procedure Laterality Date  . POLYPECTOMY     Oral  . TUBAL LIGATION    . VAGINAL WOUND CLOSURE / REPAIR  1977  . WISDOM TOOTH EXTRACTION     Social History   Social History Narrative  . Not on file     Objective: Vital Signs: There were no vitals taken for this visit.   Physical Exam   Musculoskeletal Exam: ***  CDAI Exam: No CDAI exam completed.    Investigation: No additional findings. Component     Latest Ref Rng & Units 04/07/2017  ANA Pattern 1      HOMOGENEOUS (A)  ANA Titer 1     titer 1:40 (H)  Sed Rate     0 - 30 mm/hr 10  CRP     0.5 - 20.0 mg/dL 0.8  Anit Nuclear Antibody(ANA)     NEGATIVE POSITIVE (A)  RA Latex Turbid.     <14 IU/mL <14  Uric Acid, Serum     2.4 - 7.0 mg/dL 6.1   CBC Latest Ref Rng & Units 12/17/2015 05/08/2015 03/03/2015  WBC 4.0 - 10.5 K/uL 5.9  5.8 7.2  Hemoglobin 12.0 - 15.0 g/dL 09.814.1 11.914.4 14.714.4  Hematocrit 36.0 - 46.0 % 42.7 43.8 43.3  Platelets 150.0 - 400.0 K/uL 251.0 245.0 251   CMP Latest Ref Rng & Units 12/17/2015 05/08/2015 04/30/2015  Glucose 70 - 99 mg/dL 829(F111(H) 621(H106(H) -  BUN 6 - 23 mg/dL 14 16 -  Creatinine 0.860.40 - 1.20 mg/dL 5.780.94 4.691.05 -  Sodium 629135 - 145 mEq/L 142 143 -  Potassium 3.5 - 5.1 mEq/L 3.3(L) 3.5 -  Chloride 96 - 112 mEq/L 107 109 -  CO2 19 - 32 mEq/L 28 25 -  Calcium 8.4 - 10.5 mg/dL 9.8 9.4 9.4  Total Protein 6.0 - 8.3 g/dL 7.3 6.8 -  Total Bilirubin 0.2 - 1.2 mg/dL 0.4 0.3 -  Alkaline Phos 39 - 117 U/L 98 108 -  AST 0 - 37 U/L 21 19 -  ALT 0 - 35 U/L 18 20 -    Imaging: No results found.  Speciality Comments: No specialty comments available.    Procedures:  No procedures performed Allergies: Influenza vaccines   Assessment / Plan:     Visit Diagnoses:  Positive ANA (antinuclear antibody)  Arthralgia of both hands    Orders: No orders of the defined types were placed in this encounter.  No orders of the defined types were placed in this encounter.   Face-to-face time spent with patient was *** minutes. 50% of time was spent in counseling and coordination of care.  Follow-Up Instructions: No Follow-up on file.   Gearldine Bienenstockaylor M Adewale Pucillo, PA-C  Note - This record has been created using Dragon software.  Chart creation errors have been sought, but may not always  have been located. Such creation errors do not reflect on  the standard of medical care.

## 2017-09-20 NOTE — Telephone Encounter (Signed)
Pt is due for follow please call and schedule appointment.  

## 2017-09-20 NOTE — Telephone Encounter (Signed)
Called pt left voicemail for pt to call back and scheduled follow up appt

## 2017-09-24 ENCOUNTER — Telehealth: Payer: Self-pay

## 2017-09-24 NOTE — Telephone Encounter (Signed)
Received fax with refill request. Please advise. Left patient voicemail stating she needs to schedule follow up appointment. Please advise if refill is appropriate.   Requesting: Zalepon and Meloxicam Contract:03/17/17 UDS:03/17/17 Last Visit:06/10/17 Next Visit:none with pcp Last Refill: meloxicam 08/04/17 Zaleplon 08/18/17  Please Advise

## 2017-09-24 NOTE — Telephone Encounter (Signed)
She would need to come in sometime next week. Has been about 6 months since last visit. I don't want to give refill unless she comes in. If she expresses frustration/lack of sleep without meds then would rx limited 4-5 days supply but prefer just to rx when in office.

## 2017-10-01 ENCOUNTER — Ambulatory Visit: Payer: 59 | Admitting: Rheumatology

## 2017-10-05 ENCOUNTER — Ambulatory Visit: Payer: 59 | Admitting: Rheumatology

## 2017-10-07 ENCOUNTER — Ambulatory Visit (INDEPENDENT_AMBULATORY_CARE_PROVIDER_SITE_OTHER): Payer: 59 | Admitting: Medical

## 2017-10-07 ENCOUNTER — Encounter: Payer: Self-pay | Admitting: Medical

## 2017-10-07 VITALS — BP 139/90 | HR 93 | Resp 16 | Ht 64.0 in | Wt 213.0 lb

## 2017-10-07 DIAGNOSIS — E669 Obesity, unspecified: Secondary | ICD-10-CM

## 2017-10-07 DIAGNOSIS — F419 Anxiety disorder, unspecified: Secondary | ICD-10-CM | POA: Diagnosis not present

## 2017-10-07 DIAGNOSIS — J029 Acute pharyngitis, unspecified: Secondary | ICD-10-CM | POA: Diagnosis not present

## 2017-10-07 DIAGNOSIS — F439 Reaction to severe stress, unspecified: Secondary | ICD-10-CM | POA: Diagnosis not present

## 2017-10-07 DIAGNOSIS — R0981 Nasal congestion: Secondary | ICD-10-CM

## 2017-10-07 DIAGNOSIS — G4700 Insomnia, unspecified: Secondary | ICD-10-CM

## 2017-10-07 LAB — POCT RAPID STREP A (OFFICE): Rapid Strep A Screen: POSITIVE — AB

## 2017-10-07 MED ORDER — VALSARTAN-HYDROCHLOROTHIAZIDE 160-25 MG PO TABS: 1.0000 | ORAL_TABLET | Freq: Every day | ORAL | 3 refills | Status: DC

## 2017-10-07 MED ORDER — LORCASERIN HCL 10 MG PO TABS: ORAL_TABLET | ORAL | 0 refills | Status: DC

## 2017-10-07 MED ORDER — AMOXICILLIN 875 MG PO TABS: 875.0000 mg | ORAL_TABLET | Freq: Two times a day (BID) | ORAL | 0 refills | Status: DC

## 2017-10-07 MED ORDER — ZALEPLON 10 MG PO CAPS: 10.0000 mg | ORAL_CAPSULE | Freq: Every evening | ORAL | 0 refills | Status: DC | PRN

## 2017-10-07 MED ORDER — SERTRALINE HCL 25 MG PO TABS: 25.0000 mg | ORAL_TABLET | Freq: Every day | ORAL | 3 refills | Status: DC

## 2017-10-07 MED ORDER — MELOXICAM 7.5 MG PO TABS: ORAL_TABLET | ORAL | 0 refills | Status: DC

## 2017-10-07 MED ORDER — CLONAZEPAM 0.5 MG PO TABS: ORAL_TABLET | ORAL | 3 refills | Status: DC

## 2017-10-07 MED ORDER — AMLODIPINE BESYLATE 10 MG PO TABS: ORAL_TABLET | ORAL | 5 refills | Status: DC

## 2017-10-07 NOTE — Progress Notes (Signed)
Subjective:    Patient ID: Janet Case, female    DOB: [birth year], 59 y.o.   MRN: [id number]  HPI  Pt in for follow up for htn.  Pt bp when she checks at home 130/80. Bp little higher today. She is on amlodipine and valsartan-hctz.  Pt states recent more stress at work, with children and sister. Pt sister might be homeless. Pt is worried about sister but if she stays with Janet Case it would be too stressful. Pt still on clonazepam. Pt is still on sertraline.  Pt states her insomnia is getting worse recently with stress. Pt is on sonata when needed..  Pt had st for about one week. Some dry cough. No runny nose but mild nasal congestion. No fever, no chills, no sweats or bodyaches.   Review of Systems  Constitutional: Negative for chills, fatigue and fever.  HENT: Positive for congestion and sore throat. Negative for ear discharge, ear pain and hearing loss.   Respiratory: Positive for cough.   Cardiovascular: Negative for chest pain and palpitations.  Gastrointestinal: Negative for abdominal pain.  Genitourinary: Negative for dysuria, flank pain and frequency.  Musculoskeletal: Negative for back pain, myalgias and neck pain.  Skin: Negative for rash.  Neurological: Negative for dizziness, weakness, numbness and headaches.  Hematological: Negative for adenopathy. Does not bruise/bleed easily.  Psychiatric/Behavioral: Positive for sleep disturbance. Negative for agitation, behavioral problems, confusion and suicidal ideas. The patient is nervous/anxious.     Past Medical History:  Diagnosis Date  . Anxiety   . Clonic hemifacial spasm    Left Facial  . Colon polyps   . Depression   . Hypertension   . Muscle spasms of head and/or neck      Social History   Socioeconomic History  . Marital status: Divorced    Spouse name: Not on file  . Number of children: 2  . Years of education: Not on file  . Highest education level: Not on file  Occupational History  . Occupation:  Occupational psychologist    Comment: [employer]  Social Needs  . Financial resource strain: Not on file  . Food insecurity:    Worry: Not on file    Inability: Not on file  . Transportation needs:    Medical: Not on file    Non-medical: Not on file  Tobacco Use  . Smoking status: Never Smoker  . Smokeless tobacco: Never Used  Substance and Sexual Activity  . Alcohol use: No    Alcohol/week: 0.0 oz  . Drug use: No  . Sexual activity: Never  Lifestyle  . Physical activity:    Days per week: Not on file    Minutes per session: Not on file  . Stress: Not on file  Relationships  . Social connections:    Talks on phone: Not on file    Gets together: Not on file    Attends religious service: Not on file    Active member of club or organization: Not on file    Attends meetings of clubs or organizations: Not on file    Relationship status: Not on file  . Intimate partner violence:    Fear of current or ex partner: Not on file    Emotionally abused: Not on file    Physically abused: Not on file    Forced sexual activity: Not on file  Other Topics Concern  . Not on file  Social History Narrative  . Not on file    Past  Surgical History:  Procedure Laterality Date  . POLYPECTOMY     Oral  . TUBAL LIGATION    . VAGINAL WOUND CLOSURE / REPAIR  1977  . WISDOM TOOTH EXTRACTION      Family History  Problem Relation Age of Onset  . Hyperlipidemia Mother 29       Deceased  . Heart disease Mother   . Congestive Heart Failure Mother   . Hypertension Mother   . Kidney disease Mother   . Diabetes Mother   . Dementia Mother   . Heart defect Mother   . Kidney disease Father 21       Deceased  . Heart attack Father   . Hypertension Sister   . Diabetes Sister   . Breast cancer Sister   . Depression Sister   . Anxiety disorder Sister   . Thyroid cancer Sister        Thyroid Removed  . Leukemia Maternal Aunt   . Allergies Daughter   . GI problems Daughter     Allergies    Allergen Reactions  . Influenza Vaccines Nausea Only    Current Outpatient Medications on File Prior to Visit  Medication Sig Dispense Refill  . amLODipine (NORVASC) 10 MG tablet TAKE 1 TABLET (10 MG TOTAL) BY MOUTH DAILY. 30 tablet 5  . clonazePAM (KLONOPIN) 0.5 MG tablet 1 tablet by mouth daily when necessary anxiety 30 tablet 3  . fluticasone (FLOVENT HFA) 110 MCG/ACT inhaler Inhale 2 puffs into the lungs 2 (two) times daily. 1 Inhaler 2  . ibuprofen (ADVIL,MOTRIN) 800 MG tablet TAKE 1 TABLET(800 MG) BY MOUTH EVERY 8 HOURS AS NEEDED 45 tablet 0  . meloxicam (MOBIC) 7.5 MG tablet TAKE 1 TO 2 TABLETS(7.5 TO 15 MG) BY MOUTH DAILY 30 tablet 0  . mupirocin ointment (BACTROBAN) 2 % Apply thin film twice daily 22 g 0  . topiramate (TOPAMAX) 50 MG tablet TAKE 1 TABLET (50 MG TOTAL) BY MOUTH 2 (TWO) TIMES DAILY. 60 tablet 2  . valsartan-hydrochlorothiazide (DIOVAN-HCT) 160-25 MG tablet TAKE 1 TABLET BY MOUTH DAILY. 30 tablet 3  . zaleplon (SONATA) 10 MG capsule TAKE ONE CAPSULE BY MOUTH AT BEDTIME AS NEEDED FOR SLEEP 30 capsule 0  . sertraline (ZOLOFT) 25 MG tablet   3   No current facility-administered medications on file prior to visit.     BP (!) 150/98 (BP Location: Right Arm, Patient Position: Sitting, Cuff Size: Normal)   Pulse 93   Resp 16   Ht 5\' 4"  (1.626 m)   Wt 213 lb (96.6 kg)   SpO2 97%   BMI 36.56 kg/m       Objective:   Physical Exam  General  Mental Status - Alert. General Appearance - Well groomed. Not in acute distress.  Skin Rashes- No Rashes.  HEENT Head- Normal. Ear Auditory Canal - Left- Normal. Right - Normal.Tympanic Membrane- Left- Normal. Right- Normal. Eye Sclera/Conjunctiva- Left- Normal. Right- Normal. Nose & Sinuses Nasal Mucosa- Left-  Boggy and Congested. Right-  Boggy and  Congested.Bilateral no  maxillary and no frontal sinus pressure. Mouth & Throat Lips: Upper Lip- Normal: no dryness, cracking, pallor, cyanosis, or vesicular eruption.  Lower Lip-Normal: no dryness, cracking, pallor, cyanosis or vesicular eruption. Buccal Mucosa- Bilateral- No Aphthous ulcers. Oropharynx- No Discharge or Erythema. Tonsils: Characteristics- Bilateral- mild  Erythema. Size/Enlargement- Bilateral- faint  enlargement. Discharge- bilateral-None.  Neck Neck- Supple. No Masses.   Chest and Lung Exam Auscultation: Breath Sounds:-Clear even and unlabored.  Cardiovascular  Auscultation:Rythm- Regular, rate and rhythm. Murmurs & Other Heart Sounds:Ausculatation of the heart reveal- No Murmurs.  Lymphatic Head & Neck General Head & Neck Lymphatics: Bilateral: Description- No Localized lymphadenopathy.   Neurologic Cranial Nerve exam:- CN III-XII intact(No nystagmus), symmetric smile. Strength:- 5/5 equal and symmetric strength both upper and lower extremities.      Assessment & Plan:  For your recent anxiety and stress, I would recommend that she continue with sertraline and clonazepam.  Also would recommend that you get some mild exercise and take advantage of the nice weather recently.  I think walking some daily might help clear your mind.  If anxiety worsens please let us know.  For insomnia, I did refill your Sonata.  He can use it on occasion but please remember that I do not want you to use that and clonazepam at the same time/together.  We have discussed this in the past as well.  You have recent mild dry cough and nasal congestion.  If this worsens would recommend that she get Claritin over-the-counter and Flonase.  If you do get Claritin make sure that it is not the formulation with decongestant.  Your strep throat test came back positive today.  I am going to prescribe the amoxicillin antibiotic.  For your desire to lose weight, I recommend reduced calorie intake to approximately 1500 calories a day and start to get daily exercise.  Prescription of Belviq generic sent to your pharmacy.   Follow-up in 1 month to assess weight  loss.

## 2017-10-07 NOTE — Patient Instructions (Addendum)
For your recent anxiety and stress, I would recommend that she continue with sertraline and clonazepam.  Also would recommend that you get some mild exercise and take advantage of the nice weather recently.  I think walking some daily might help clear your mind.  If anxiety worsens please let us know.  For insomnia, I did refill your Sonata.  He can use it on occasion but please remember that I do not want you to use that and clonazepam at the same time/together.  We have discussed this in the past as well.  You have recent mild dry cough and nasal congestion.  If this worsens would recommend that she get Claritin over-the-counter and Flonase.  If you do get Claritin make sure that it is not the formulation with decongestant.  Your strep throat test came back positive today.  I am going to prescribe the amoxicillin antibiotic.  For your desire to lose weight, I recommend reduced calorie intake to approximately 1500 calories a day and start to get daily exercise.  Prescription of Belviq generic sent to your pharmacy.   Your blood pressure is moderately controlled today.  Better when I rechecked it and it was initially.  Recommend checking your blood pressure 3 times a week and making sure that blood pressure remains less than 140/90.  Follow-up in 1 month to assess weight loss.

## 2017-10-11 ENCOUNTER — Telehealth: Payer: Self-pay

## 2017-10-11 NOTE — Telephone Encounter (Signed)
PA initiated via Covermymeds; KEY: [id number]. Awaiting determination.

## 2017-10-12 NOTE — Telephone Encounter (Signed)
PA denied. Plan exclusion.  

## 2017-10-13 ENCOUNTER — Encounter: Payer: Self-pay | Admitting: Medical

## 2017-10-19 ENCOUNTER — Other Ambulatory Visit: Payer: Self-pay | Admitting: Medical

## 2017-11-09 NOTE — Progress Notes (Deleted)
Office Visit Note  Patient: Janet Case             Date of Birth: 03/13/1961           MRN: [id number]             PCP: Marisue Brooklyn Referring: Marisue Brooklyn Visit Date: 11/23/2017 Occupation: @    Subjective:  No chief complaint on file.   History of Present Illness: Janet Case is a 57 y.o. female ***   Activities of Daily Living:  Patient reports morning stiffness for *** {minute/hour:19697}.   Patient {ACTIONS;DENIES/REPORTS:21021675::"Denies"} nocturnal pain.  Difficulty dressing/grooming: {ACTIONS;DENIES/REPORTS:21021675::"Denies"} Difficulty climbing stairs: {ACTIONS;DENIES/REPORTS:21021675::"Denies"} Difficulty getting out of chair: {ACTIONS;DENIES/REPORTS:21021675::"Denies"} Difficulty using hands for taps, buttons, cutlery, and/or writing: {ACTIONS;DENIES/REPORTS:21021675::"Denies"}   No Rheumatology ROS completed.   PMFS History:  Patient Active Problem List   Diagnosis Date Noted  . Hemifacial spasm 02/03/2016  . Thyroid nodule 02/03/2016  . Obstructive sleep apnea 01/25/2016  . Anxiety and depression 09/21/2015  . Cystocele 05/31/2015  . Rectocele 05/31/2015  . Physical exam 05/08/2015  . Essential hypertension 04/12/2015  . Facial twitching 04/12/2015  . Insomnia 04/12/2015  . 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 Family history of breast cancer 04/12/2015    Past Medical History:  Diagnosis Date  . Anxiety   . Clonic hemifacial spasm    Left Facial  . Colon polyps   . Depression   . Hypertension   . Muscle spasms of head and/or neck     Family History  Problem Relation Age of Onset  . Hyperlipidemia Mother 53       Deceased  . Heart disease Mother   . Congestive Heart Failure Mother   . Hypertension Mother   . Kidney disease Mother   . Diabetes Mother   . Dementia Mother   . Heart defect Mother   . Kidney disease Father 68       Deceased  . Heart attack Father   . Hypertension Sister   . Diabetes  Sister   . Breast cancer Sister   . Depression Sister   . Anxiety disorder Sister   . Thyroid cancer Sister        Thyroid Removed  . Leukemia Maternal Aunt   . Allergies Daughter   . GI problems Daughter    Past Surgical History:  Procedure Laterality Date  . POLYPECTOMY     Oral  . TUBAL LIGATION    . VAGINAL WOUND CLOSURE / REPAIR  1977  . WISDOM TOOTH EXTRACTION     Social History   Social History Narrative  . Not on file     Objective: Vital Signs: There were no vitals taken for this visit.   Physical Exam   Musculoskeletal Exam: ***  CDAI Exam: No CDAI exam completed.    Investigation: No additional findings.  Component     Latest Ref Rng & Units 04/07/2017  ANA Pattern 1      HOMOGENEOUS (A)  ANA Titer 1     titer 1:40 (H)  Sed Rate     0 - 30 mm/hr 10  CRP     0.5 - 20.0 mg/dL 0.8  Anit Nuclear Antibody(ANA)     NEGATIVE POSITIVE (A)  RA Latex Turbid.     <14 IU/mL <14  Uric Acid, Serum     2.4 - 7.0 mg/dL 6.1   CBC Latest Ref Rng & Units 12/17/2015 05/08/2015 03/03/2015  WBC 4.0 - 10.5 K/uL  5.9 5.8 7.2  Hemoglobin 12.0 - 15.0 g/dL 16.1 09.6 04.5  Hematocrit 36.0 - 46.0 % 42.7 43.8 43.3  Platelets 150.0 - 400.0 K/uL 251.0 245.0 251   CMP Latest Ref Rng & Units 12/17/2015 05/08/2015 04/30/2015  Glucose 70 - 99 mg/dL 409(W) 119(J) -  BUN 6 - 23 mg/dL 14 16 -  Creatinine 4.78 - 1.20 mg/dL 2.95 6.21 -  Sodium 308 - 145 mEq/L 142 143 -  Potassium 3.5 - 5.1 mEq/L 3.3(L) 3.5 -  Chloride 96 - 112 mEq/L 107 109 -  CO2 19 - 32 mEq/L 28 25 -  Calcium 8.4 - 10.5 mg/dL 9.8 9.4 9.4  Total Protein 6.0 - 8.3 g/dL 7.3 6.8 -  Total Bilirubin 0.2 - 1.2 mg/dL 0.4 0.3 -  Alkaline Phos 39 - 117 U/L 98 108 -  AST 0 - 37 U/L 21 19 -  ALT 0 - 35 U/L 18 20 -    Imaging: No results found.  Speciality Comments: No specialty comments available.    Procedures:  No procedures performed Allergies: Influenza vaccines   Assessment / Plan:     Visit Diagnoses:  Positive ANA (antinuclear antibody)  Bilateral hand pain  Essential hypertension  Obstructive sleep apnea  Thyroid nodule  Anxiety and depression  History of colonic polyps    Orders: No orders of the defined types were placed in this encounter.  No orders of the defined types were placed in this encounter.   Face-to-face time spent with patient was *** minutes. 50% of time was spent in counseling and coordination of care.  Follow-Up Instructions: No follow-ups on file.   Gearldine Bienenstock, PA-C  Note - This record has been created using Dragon software.  Chart creation errors have been sought, but may not always  have been located. Such creation errors do not reflect on  the standard of medical care.

## 2017-11-10 ENCOUNTER — Ambulatory Visit: Payer: 59 | Admitting: Rheumatology

## 2017-11-11 ENCOUNTER — Other Ambulatory Visit: Payer: Self-pay | Admitting: Medical

## 2017-11-12 NOTE — Telephone Encounter (Signed)
Requesting: Sonata Contract:03/17/17 UDS:03/17/17 Last Visit:10/07/17 Next Visit:none with pcp Last Refill:10/07/17  Please Advise

## 2017-11-15 MED ORDER — ZALEPLON 10 MG PO CAPS: 10.0000 mg | ORAL_CAPSULE | Freq: Every evening | ORAL | 0 refills | Status: DC | PRN

## 2017-11-15 NOTE — Telephone Encounter (Signed)
Rx sonata sent to pt pharmacy. 

## 2017-11-23 ENCOUNTER — Ambulatory Visit: Payer: 59 | Admitting: Rheumatology

## 2017-11-23 ENCOUNTER — Other Ambulatory Visit: Payer: Self-pay | Admitting: Medical

## 2017-12-08 ENCOUNTER — Encounter: Payer: Self-pay | Admitting: Medical

## 2017-12-08 ENCOUNTER — Telehealth: Payer: Self-pay | Admitting: Medical

## 2017-12-08 ENCOUNTER — Other Ambulatory Visit: Payer: Self-pay | Admitting: Medical

## 2017-12-08 MED ORDER — MELOXICAM 7.5 MG PO TABS: ORAL_TABLET | ORAL | 0 refills | Status: DC

## 2017-12-08 NOTE — Telephone Encounter (Signed)
I refilled her meloxicam. But did not refill her amoxicillin. Did she ask for antibiotic? If she has possible infection recommend appointment. Thanks.

## 2017-12-08 NOTE — Telephone Encounter (Signed)
I hope everything is going well for you   I just wanted to let you know that I have had some FMLA PAPERWORK FAXED TO YOU TODAY. AND IT NEEDS TO BE RETURNED NO LATER THAN December 28, 2017. 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 If you can look at these new fmla papers and fill it out the same. Let me now if you are having any problems filling the forms out. Thank Ramon Dredge.

## 2017-12-08 NOTE — Telephone Encounter (Signed)
Refilled her meloxicam but denied amoxicillin. She needs office visit if she feels like needs antibiotic.  Thanks,

## 2017-12-09 NOTE — Telephone Encounter (Signed)
Message sent to patient

## 2017-12-09 NOTE — Telephone Encounter (Signed)
Noted, received paperwork/SLS 05/30

## 2017-12-15 NOTE — Telephone Encounter (Signed)
Completed as much as possible on FMLA re-cert paperwork; forwarded to provider/SLS 06/05

## 2017-12-16 ENCOUNTER — Ambulatory Visit: Payer: 59 | Admitting: Medical

## 2017-12-20 ENCOUNTER — Telehealth: Payer: Self-pay | Admitting: Medical

## 2017-12-20 NOTE — Telephone Encounter (Signed)
I signed the [employer] form. Thanks for helping me out with that. Will you go ahead and send.

## 2017-12-21 NOTE — Telephone Encounter (Signed)
Paperwork faxed with confirmation/SLS 06/11 

## 2017-12-21 NOTE — Telephone Encounter (Signed)
Paperwork faxed with confirmation/SLS 06/11

## 2017-12-21 NOTE — Telephone Encounter (Signed)
She wanted it filled out the same as last time. It looked the same. I can stop by and glance at what you are talking about.  Thanks,

## 2017-12-21 NOTE — Telephone Encounter (Signed)
No problem; has she had any single continuous period longer than three days that needs to be documented that you are aware of [Part B, #5]. Thanks/SLS 06/11

## 2017-12-22 ENCOUNTER — Other Ambulatory Visit: Payer: Self-pay | Admitting: Medical

## 2017-12-23 NOTE — Telephone Encounter (Signed)
Last RX:11/15/2017 Last OV: 3/28/209 Next OV: None scheduled UDS: 03/17/17 CSC: 03/17/17  Please advise.

## 2017-12-23 NOTE — Telephone Encounter (Signed)
Refilled pt sonata. Notify her please.

## 2017-12-24 ENCOUNTER — Telehealth: Payer: Self-pay | Admitting: Medical

## 2017-12-24 ENCOUNTER — Encounter: Payer: Self-pay | Admitting: Medical

## 2017-12-24 NOTE — Telephone Encounter (Signed)
I need for you to obtain my fmla paperwork and change the Please have the provider review and update the frequency and duration of illness flare ups.  Example: 2 - 3 Episode per 1 week lasting up to 3 days Please have your provider update the certification, initial and date any changes, and then return by fax to  [phone], no later than January 04, 2018.   Jasmine DecemberSharon,  The above is message from pt sent by My chart. You filled out as I asked you to. Basically copied from her previous form. So can you get copy of her fmla forms we faxed the other day and compare to how she wants us to fill out? Note her time frame.  Thanks. Ramon DredgeEdward

## 2018-01-08 ENCOUNTER — Encounter: Payer: Self-pay | Admitting: Medical

## 2018-01-10 ENCOUNTER — Encounter: Payer: Self-pay | Admitting: Medical

## 2018-01-18 NOTE — Telephone Encounter (Signed)
Information provided to provider/SLS

## 2018-01-25 ENCOUNTER — Telehealth: Payer: Self-pay | Admitting: Medical

## 2018-01-25 NOTE — Telephone Encounter (Signed)
I need for you to obtain my fmla paperwork and change the Please have the provider review and update the frequency and duration of illness flare ups.  Example: 2 - 3 Episode per 1 week lasting up to 3 days Please have your provider update the certification, initial and date any changes, and then return by fax to  [phone], no later than January 04, 2018.   Jasmine DecemberSharon,  The above is message from pt sent by My chart. You filled out as I asked you to. Basically copied from her previous form. So can you get copy of her fmla forms we faxed the other day and compare to how she wants us to fill out? Note her time frame.  Thanks. Ramon Dredgedward   This is last thing I sent you. Has been some time since any request from her. I don't have any forms from her. Were modifications made to her form. When was last forms for her faxed? You do keep copy of those correct?  I am asking pt to come in for appointment.

## 2018-01-26 ENCOUNTER — Ambulatory Visit: Payer: 59 | Admitting: Rheumatology

## 2018-01-27 ENCOUNTER — Other Ambulatory Visit: Payer: Self-pay | Admitting: Medical

## 2018-01-28 IMAGING — US US SOFT TISSUE HEAD/NECK
1 series · 14 of 25 positions shown · non-contrast
Comparison: 05/02/2009 by report only

CLINICAL DATA: Thyromegaly. Previous FNA biopsy of right and left
nodules 05/07/2009.

EXAM:
THYROID ULTRASOUND
TECHNIQUE: Ultrasound examination of the thyroid gland and adjacent soft
tissues was performed.

[Series 1: us soft tissue head/neck · 0.07mm/px · 14 of 40 slices shown]
[im 1/40]
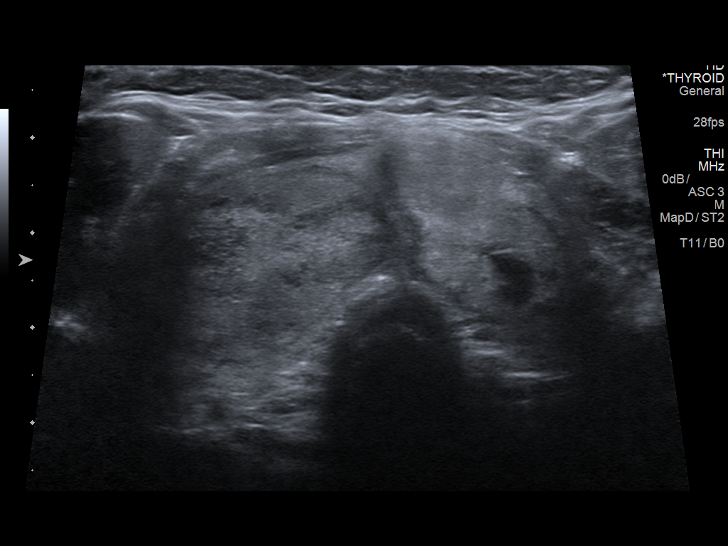
[im 4/40]
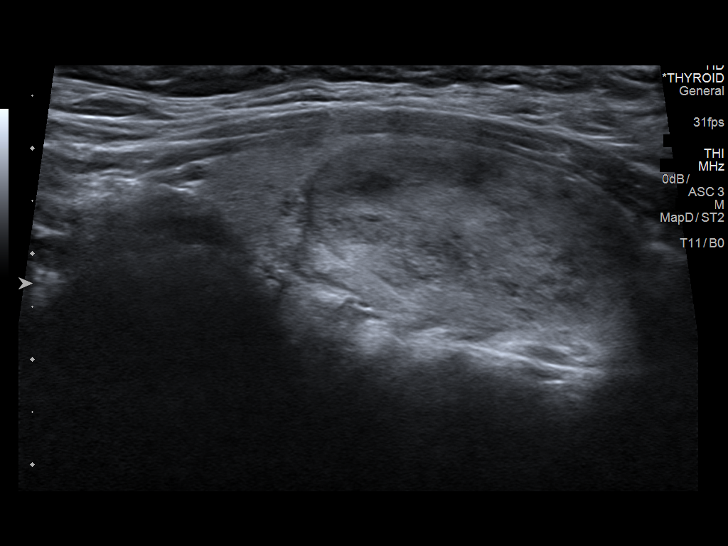
[im 7/40]
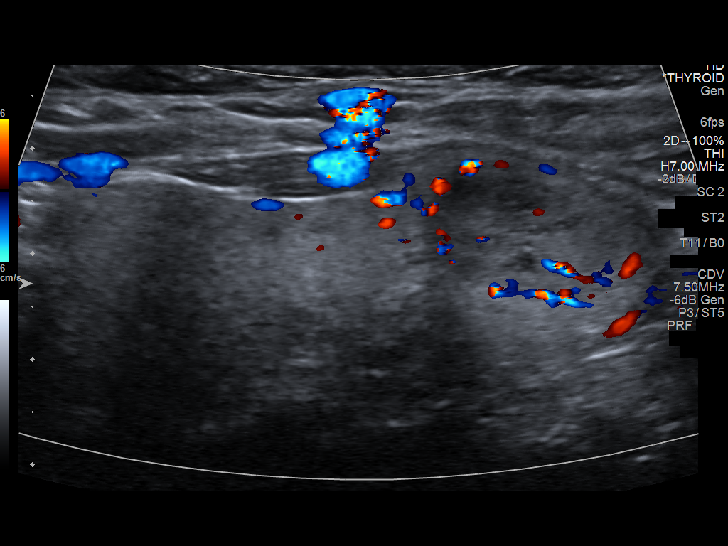
[im 10/40]
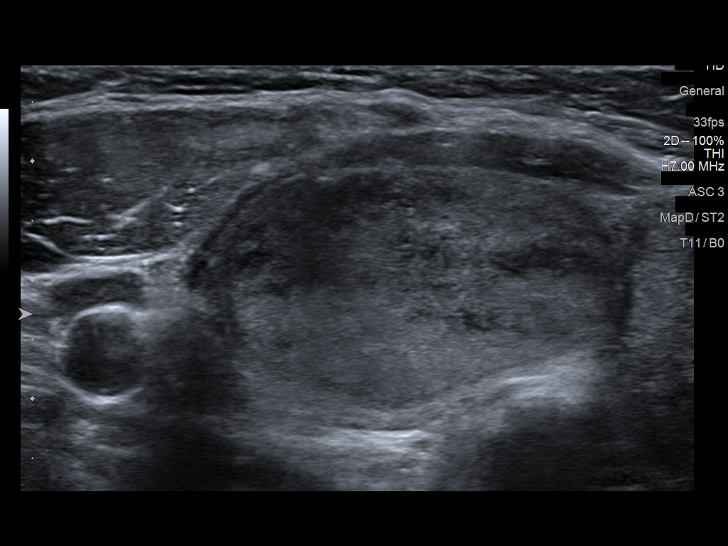
[im 14/40]
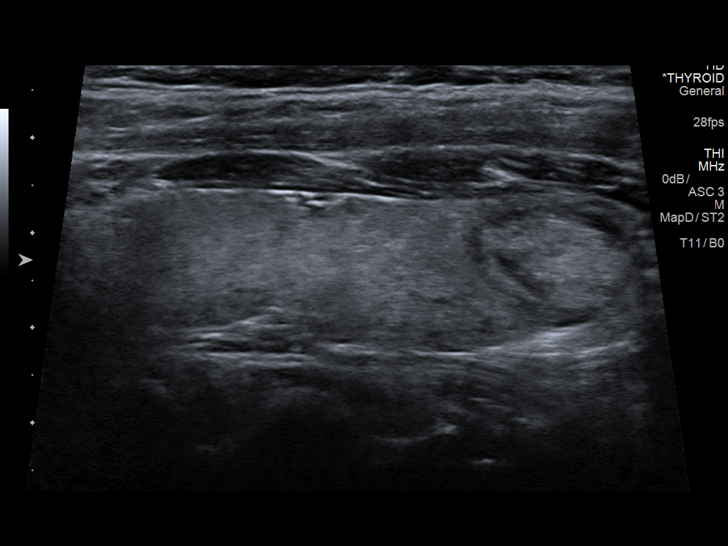
[im 15/40]
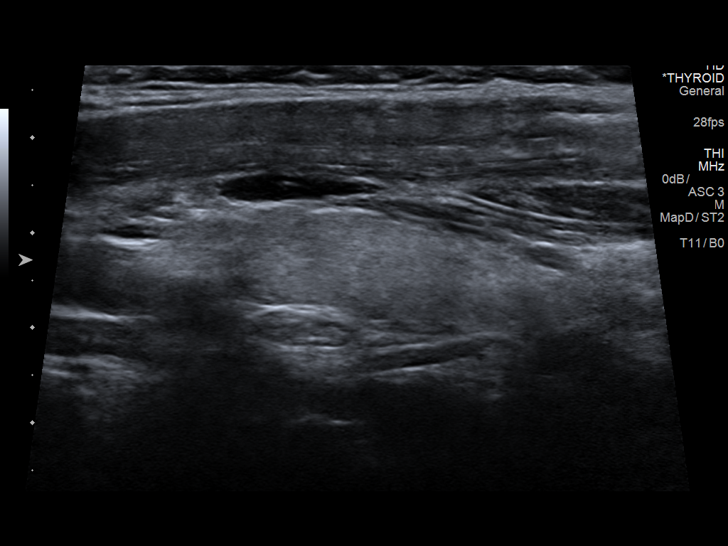
[im 18/40]
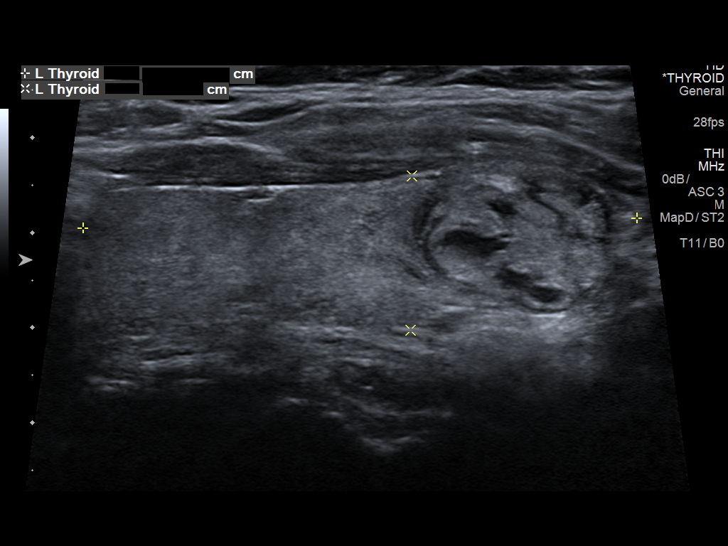
[im 22/40]
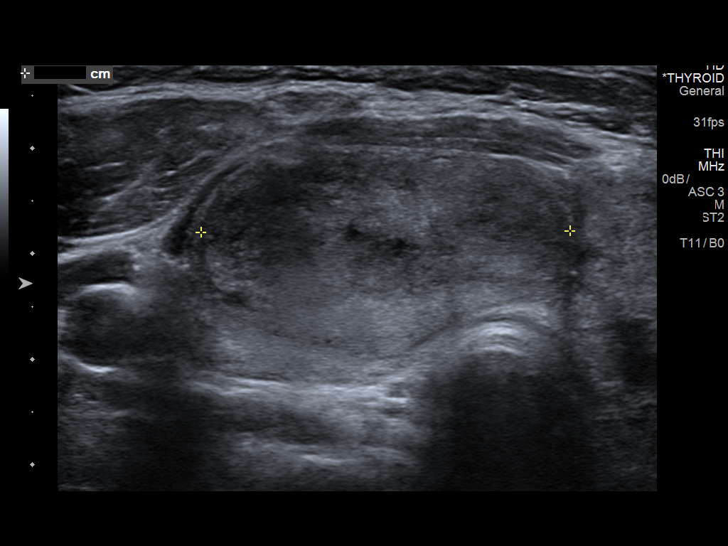
[im 25/40]
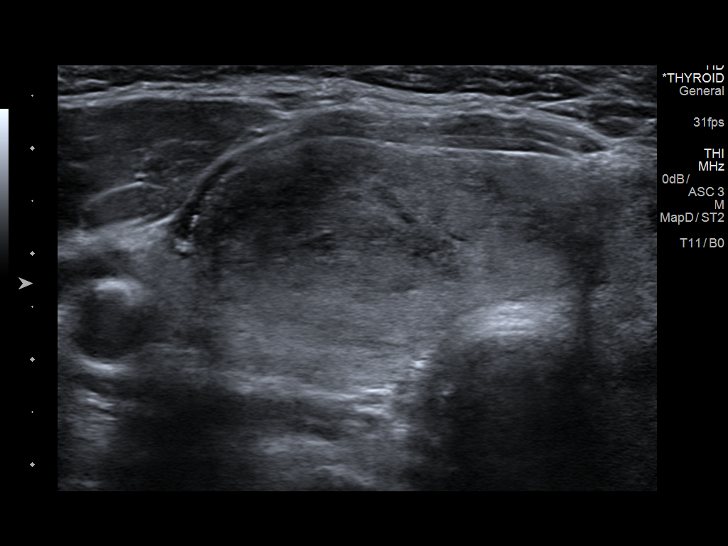
[im 27/40]
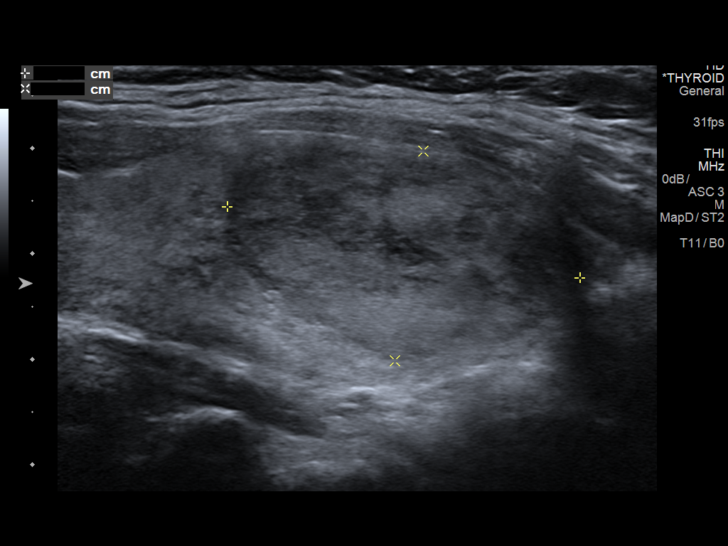
[im 30/40]
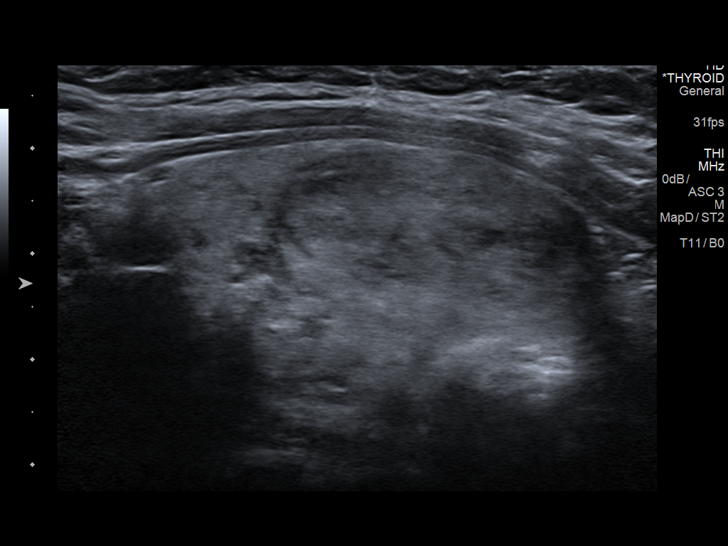
[im 33/40]
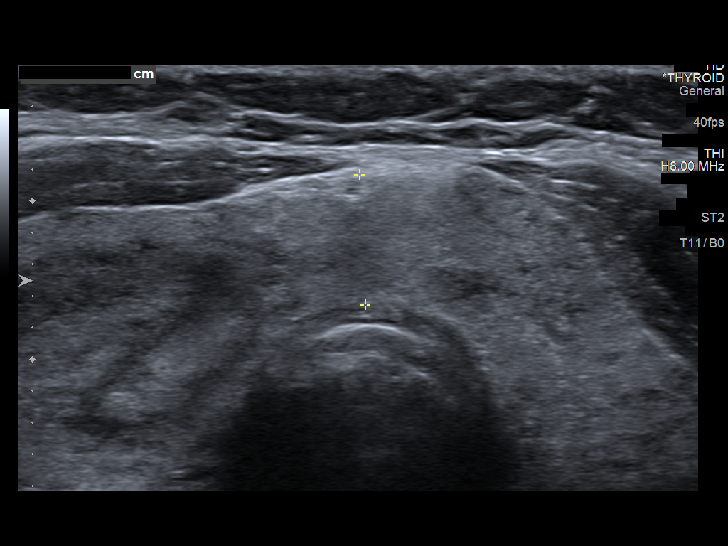
[im 36/40]
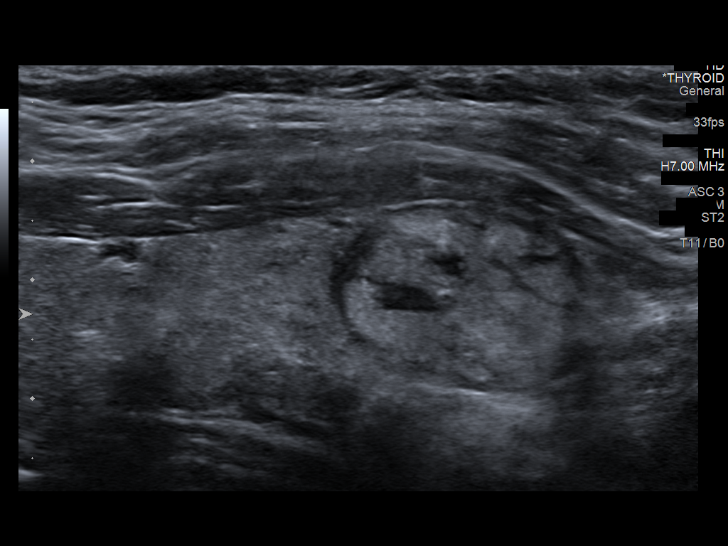
[im 40/40]
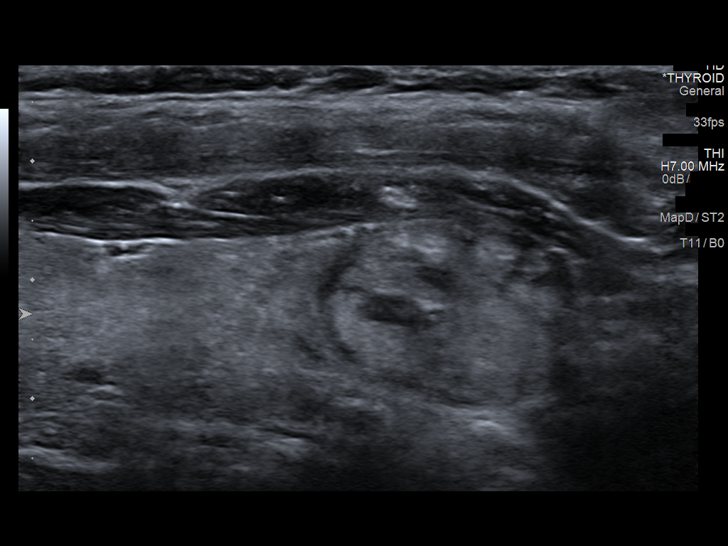

[14 of 25 positions shown; findings below may reference images not displayed]

FINDINGS: Right thyroid lobe

Measurements: 6.6 x 2.3 x 2.7 cm. Solitary 3.4 x 3.5 x 2 cm mostly
solid nodule, inferior pole (previously 2.5 cm by report).

Left thyroid lobe

Measurements: 5.8 x 1.6 x 2.3 cm. 2.2 x 2.1 x 1.6 cm mostly solid
nodule, inferior pole (previously 2.6 cm).

Isthmus

Thickness: 0.8 cm.  No nodules visualized.

Lymphadenopathy

None visualized.
IMPRESSION: 1. Thyromegaly with bilateral nodules, enlarged on the right
compared to previous report from 05/02/2009. Correlate with previous
biopsy results.

## 2018-01-31 NOTE — Telephone Encounter (Signed)
Patient was given [2] days a month for appointments at [6] hours each and [3] days a week for being out of work with flares; forwarded completed & blank paperwork to provider/SLS 07/22

## 2018-02-01 ENCOUNTER — Telehealth: Payer: Self-pay

## 2018-02-01 MED FILL — TOPIRAMATE 50 MG TABLET: 50 | 30 days supply | Qty: 60 | Fill #1

## 2018-02-01 NOTE — Telephone Encounter (Signed)
rx refill of meloxicam and sonata sent in. Please schedule her a follow up appointment with me to discuss her forms/fill out.

## 2018-02-01 NOTE — Telephone Encounter (Signed)
Please call and schedule pt for 40 min appointment for paperwork/follow up.

## 2018-02-01 NOTE — Telephone Encounter (Signed)
Another message that I don't see but routed to me.

## 2018-02-02 ENCOUNTER — Telehealth: Payer: Self-pay

## 2018-02-02 NOTE — Telephone Encounter (Signed)
Author phoned pt. to set up an OV appointment with Ramon DredgeEdward per provider's request to address FMLA paperwork. Pt. laughed, stating, "all I need him to do is adjust the instructions, sign, and resubmit ". Pt. needs the FMLA paperwork to clear her for missing up to and including 2 episodes per week, with each episode lasting a span of up to three days. In this case, if she is out of work Mon-Tues-Wed, that would count as one episode, and if she is out Friday, that would count as the second separate episode. Routed to [place] to review and approve. Pt. Did not want to make an OV at this time.

## 2018-02-02 NOTE — Telephone Encounter (Signed)
Requesting:  Clonazepam 0.5mg  qday prn Contract: 2018 UDS:03/17/17 Last OV: 10/07/17 Next Ov: N/A Last refill: 10/07/17, #30, 3 RF Database: no discrepancies found.  Please advise.

## 2018-02-03 ENCOUNTER — Telehealth: Payer: Self-pay | Admitting: Medical

## 2018-02-03 ENCOUNTER — Ambulatory Visit: Payer: 59 | Admitting: Medical

## 2018-02-03 MED ORDER — CLONAZEPAM 0.5 MG PO TABS: ORAL_TABLET | ORAL | 0 refills | Status: DC

## 2018-02-03 NOTE — Telephone Encounter (Signed)
Refill pt clonzepam. I attempted to fill out fmla form yesterday. But I need to clarify she is ok with wording on answer 7 and 8. She still need to come in next week to discuss chronic med problems and go over her reasons for missing work/fmla. If she ok's answers on 7 and 8 fmla form then can fax over.  Going forward if numbers of day she misses work exceeds fmla form then she needs to come in. Won't be able to modify based on phone calls. Best with fmla to see her and document reason for change.

## 2018-02-03 NOTE — Telephone Encounter (Signed)
Tried to call pt twice to notify her that paperwork is filled out also to make sure everything was correct. Pt did not answer left message for pt to call back.

## 2018-02-03 NOTE — Telephone Encounter (Signed)
Called pt and informed the below, pt schedule for today at 3:20 (40 min appt)

## 2018-02-03 NOTE — Telephone Encounter (Signed)
Patient missed her appointment today.  I went ahead and filled out her FMLA form.  I did ask Jasmine to go ahead and call patient and go over answer 7 8 make sure that is how it should be worded.  Patient had incremental worsening of her condition and missed more days that were allotted on answer 8.  Since I last saw her she was sending forms asking me to change that and I really have not seen her in did not know details of her condition.  Since FMLA form time sensitive I went ahead and fill that out today after finding out that she canceled the appointment.  She is scheduled for next Tuesday and I have plans to document details on those days that she had to miss work.  Also want her to come in the office to discuss controlled medication use.  She currently needs prescription of the clonazepam.  I am going to go ahead and send that prescription and but past message along to [facility] to tell patient she has to be seen next Tuesday.  Asked Jazmine to fax the FMLA form and and keep a copy of the fax transmission.  Also keep a copy of the form of a log today.

## 2018-02-04 ENCOUNTER — Other Ambulatory Visit: Payer: Self-pay | Admitting: Medical

## 2018-02-04 NOTE — Telephone Encounter (Signed)
Requesting:Clonazepam Contract:03/17/17 UDS:03/17/17 Last Visit:10/07/17 Next Visit:02/08/18 Last Refill:yesterday   Please Advise

## 2018-02-04 NOTE — Telephone Encounter (Signed)
Would you try to call patient about wording on fmla paper.

## 2018-02-04 NOTE — Telephone Encounter (Signed)
Author phoned pt. To make OV appointment for FMLA, etc. Per Arlis PortaEdward Saguier's request. Pt. already rescheduled her appointment for 7/30 at 1120. Ramon Dredgedward made aware.

## 2018-02-08 ENCOUNTER — Ambulatory Visit (INDEPENDENT_AMBULATORY_CARE_PROVIDER_SITE_OTHER): Payer: 59 | Admitting: Medical

## 2018-02-08 ENCOUNTER — Telehealth: Payer: Self-pay | Admitting: *Deleted

## 2018-02-08 ENCOUNTER — Encounter: Payer: Self-pay | Admitting: Medical

## 2018-02-08 VITALS — BP 144/104 | HR 88 | Temp 98.1°F | Resp 16 | Ht 64.0 in | Wt 227.0 lb

## 2018-02-08 DIAGNOSIS — G4700 Insomnia, unspecified: Secondary | ICD-10-CM | POA: Diagnosis not present

## 2018-02-08 DIAGNOSIS — F439 Reaction to severe stress, unspecified: Secondary | ICD-10-CM

## 2018-02-08 DIAGNOSIS — F419 Anxiety disorder, unspecified: Secondary | ICD-10-CM | POA: Diagnosis not present

## 2018-02-08 DIAGNOSIS — I10 Essential (primary) hypertension: Secondary | ICD-10-CM | POA: Diagnosis not present

## 2018-02-08 DIAGNOSIS — F329 Major depressive disorder, single episode, unspecified: Secondary | ICD-10-CM

## 2018-02-08 DIAGNOSIS — F32A Depression, unspecified: Secondary | ICD-10-CM

## 2018-02-08 MED ORDER — VALSARTAN-HYDROCHLOROTHIAZIDE 320-25 MG PO TABS: 1.0000 | ORAL_TABLET | Freq: Every day | ORAL | 3 refills | Status: DC

## 2018-02-08 NOTE — Telephone Encounter (Signed)
Received FMLA/STD paperwork from provider upon completion during OV with pt today;[facility]ed to [facility] [place] at fax [phone]/SLS 07/30

## 2018-02-08 NOTE — Progress Notes (Signed)
Subjective:    Patient ID: Janet Case, female    DOB: Mar 07, 1961, 57 y.o.   MRN: [id number]  HPI   Pt still having severe stress related to work. Pt state call volume has increased. She is working call center. Her work will get more stressful as open enrollment starting in the fall. She uses xanax as needed for severe stress/anxiety. She is trying to get another job.  Pt has some depression history. She starts sertraline antidepressant. This helps her mood.   For insomnia pt is using sonata. Pt is aware not to use sonata with clonazepam.  Pt admits that her bp has been in 140/100 range at times when she checks. No cardiac or neurologic signs or symptoms.    Review of Systems  Constitutional: Negative for chills, fatigue and fever.  Respiratory: Negative for cough, chest tightness, shortness of breath and wheezing.   Cardiovascular: Negative for chest pain and palpitations.  Gastrointestinal: Negative for abdominal pain.  Musculoskeletal: Negative for back pain.  Skin: Negative for color change and rash.  Neurological: Negative for dizziness, seizures, speech difficulty, weakness, numbness and headaches.  Psychiatric/Behavioral: Positive for dysphoric mood and sleep disturbance. Negative for behavioral problems and confusion. The patient is nervous/anxious.        But medications are helping her conditions.    Past Medical History:  Diagnosis Date  . Anxiety   . Clonic hemifacial spasm    Left Facial  . Colon polyps   . Depression   . Hypertension   . Muscle spasms of head and/or neck      Social History   Socioeconomic History  . Marital status: Divorced    Spouse name: Not on file  . Number of children: 2  . Years of education: Not on file  . Highest education level: Not on file  Occupational History  . Occupation: Occupational psychologist    Comment: [employer]  Social Needs  . Financial resource strain: Not on file  . Food insecurity:    Worry: Not on file     Inability: Not on file  . Transportation needs:    Medical: Not on file    Non-medical: Not on file  Tobacco Use  . Smoking status: Never Smoker  . Smokeless tobacco: Never Used  Substance and Sexual Activity  . Alcohol use: No    Alcohol/week: 0.0 oz  . Drug use: No  . Sexual activity: Never  Lifestyle  . Physical activity:    Days per week: Not on file    Minutes per session: Not on file  . Stress: Not on file  Relationships  . Social connections:    Talks on phone: Not on file    Gets together: Not on file    Attends religious service: Not on file    Active member of club or organization: Not on file    Attends meetings of clubs or organizations: Not on file    Relationship status: Not on file  . Intimate partner violence:    Fear of current or ex partner: Not on file    Emotionally abused: Not on file    Physically abused: Not on file    Forced sexual activity: Not on file  Other Topics Concern  . Not on file  Social History Narrative  . Not on file    Past Surgical History:  Procedure Laterality Date  . POLYPECTOMY     Oral  . TUBAL LIGATION    . VAGINAL  WOUND CLOSURE / REPAIR  1977  . WISDOM TOOTH EXTRACTION      Family History  Problem Relation Age of Onset  . Hyperlipidemia Mother 1571       Deceased  . Heart disease Mother   . Congestive Heart Failure Mother   . Hypertension Mother   . Kidney disease Mother   . Diabetes Mother   . Dementia Mother   . Heart defect Mother   . Kidney disease Father 380       Deceased  . Heart attack Father   . Hypertension Sister   . Diabetes Sister   . Breast cancer Sister   . Depression Sister   . Anxiety disorder Sister   . Thyroid cancer Sister        Thyroid Removed  . Leukemia Maternal Aunt   . Allergies Daughter   . GI problems Daughter     Allergies  Allergen Reactions  . Influenza Vaccines Nausea Only    Current Outpatient Medications on File Prior to Visit  Medication Sig Dispense Refill    . amLODipine (NORVASC) 10 MG tablet TAKE 1 TABLET (10 MG TOTAL) BY MOUTH DAILY. 30 tablet 5  . amoxicillin (AMOXIL) 875 MG tablet Take 1 tablet (875 mg total) by mouth 2 (two) times daily. 20 tablet 0  . clonazePAM (KLONOPIN) 0.5 MG tablet 1 tablet by mouth daily when necessary anxiety 30 tablet 0  . meloxicam (MOBIC) 7.5 MG tablet TAKE 1 TO 2 TABLETS(7.5 TO 15 MG) BY MOUTH DAILY 30 tablet 0  . mupirocin ointment (BACTROBAN) 2 % Apply thin film twice daily 22 g 0  . sertraline (ZOLOFT) 25 MG tablet Take 1 tablet (25 mg total) by mouth daily. 30 tablet 3  . topiramate (TOPAMAX) 50 MG tablet TAKE 1 TABLET (50 MG TOTAL) BY MOUTH 2 (TWO) TIMES DAILY. 60 tablet 2  . valsartan-hydrochlorothiazide (DIOVAN-HCT) 160-25 MG tablet Take 1 tablet by mouth daily. 30 tablet 3  . zaleplon (SONATA) 10 MG capsule TAKE 1 CAPSULE(10 MG) BY MOUTH AT BEDTIME AS NEEDED FOR SLEEP 30 capsule 0   No current facility-administered medications on file prior to visit.     BP (!) 144/104 (BP Location: Left Arm, Patient Position: Sitting, Cuff Size: Large)   Pulse 88   Temp 98.1 F (36.7 C) (Oral)   Resp 16   Ht 5\' 4"  (1.626 m)   Wt 227 lb (103 kg)   SpO2 95%   BMI 38.96 kg/m        Objective:   Physical Exam  General Mental Status- Alert. General Appearance- Not in acute distress.   Skin General: Color- Normal Color. Moisture- Normal Moisture.  Neck Carotid Arteries- Normal color. Moisture- Normal Moisture. No carotid bruits. No JVD.  Chest and Lung Exam Auscultation: Breath Sounds:-Normal.  Cardiovascular Auscultation:Rythm- Regular. Murmurs & Other Heart Sounds:Auscultation of the heart reveals- No Murmurs.  Abdomen Inspection:-Inspeection Normal. Palpation/Percussion:Note:No mass. Palpation and Percussion of the abdomen reveal- Non Tender, Non Distended + BS, no rebound or guarding.    Neurologic Cranial Nerve exam:- CN III-XII intact(No nystagmus), symmetric smile. Drift Test:- No  drift. Romberg Exam:- Negative.  Heal to Toe Gait exam:-Normal. Finger to Nose:- Normal/Intact Strength:- 5/5 equal and symmetric strength both upper and lower extremities.      Assessment & Plan:  Your blood pressure has been elevated when you check.  Readings reported to be over 140/90 most of the time.  Today's reading when nurse checked and when I checked had  similar high readings.  So I am going to increase dosage on your valsartan/HCTZ and advise you continue amlodipine the same.  Your anxiety, depression and insomnia are well treated with medications.  As a reminder, I do not want to use Sonata and clonazepam at the same time.  Continue to use clonazepam during the day for extreme anxiety, stress or panic attack.  I did fill out your FMLA form and will give it to [place] or Jasmine December to fax today.  I am asking them to keep a copy of the fax confirmation.  Your controlled medication contract and UDS are up-to-date.  Follow-up in October for complete physical.  Or as needed.  I gave Jasmine December copy of fmla form today. Asked her to keep copy of form and to keep copy of fax confirmation.

## 2018-02-08 NOTE — Patient Instructions (Signed)
Your blood pressure has been elevated when you check.  Readings reported to be over 140/90 most of the time.  Today's reading when nurse checked and when I checked had similar high readings.  So I am going to increase dosage on your valsartan/HCTZ and advise you continue amlodipine the same.  Your anxiety, depression and insomnia are well treated with medications.  As a reminder, I do not want to use Sonata and clonazepam at the same time.  Continue to use clonazepam during the day for extreme anxiety, stress or panic attack.  I did fill out your FMLA form and will give it to [place] or Jasmine December[place] to fax today.  I am asking them to keep a copy of the fax confirmation.  Your controlled medication contract and UDS are up-to-date.  Follow-up in October for complete physical.  Or as needed.

## 2018-02-17 ENCOUNTER — Other Ambulatory Visit: Payer: Self-pay | Admitting: Medical

## 2018-02-17 NOTE — Telephone Encounter (Signed)
Have her come in for appt tomorrow. This isn't a pain medication and could cause more harm that good if given for something that isn't a bacterial infection.TY.

## 2018-02-17 NOTE — Telephone Encounter (Signed)
Janet Case patient   Please advise   Patient states she is still in pain?

## 2018-02-18 ENCOUNTER — Encounter: Payer: Self-pay | Admitting: Podiatry

## 2018-02-18 MED ORDER — IBUPROFEN 800 MG PO TABS: 800.0000 mg | ORAL_TABLET | Freq: Three times a day (TID) | ORAL | 0 refills | Status: DC | PRN

## 2018-02-18 NOTE — Telephone Encounter (Signed)
Request for amoxicillin refused, as pt. needs to be seen for OV per Dr. Carmelia RollerWendling. Author phoned pt. to set up an appointment. Pt. stated, "oh no, I meant ibuprofen". Dr. Al CorpusHyatt refilled ibuprofen today 8/9. Pt. requesting GI follow-up and stated "should I do cologuard or get an colonoscopy?". Pt. [facility]. Author could not answer question, so deferred to Whole FoodsEdward Saguier, PA to address. Pt. did not want to make an appointment with Ramon DredgeEdward at this time but ,"will call back in October to make a physical". Routed to [place].

## 2018-02-18 NOTE — Telephone Encounter (Signed)
LVM for pt to call the office and schedule an appt by recommendation from provider.

## 2018-02-18 NOTE — Telephone Encounter (Signed)
Emailed pt Dr. Al CorpusHyatt refilled the ibuprofen 800mg  and she would need to be seen prior to future refills.

## 2018-02-20 NOTE — Telephone Encounter (Signed)
Pt will need to sign release form so we could get old GI colonoscopy report or records? Has she ever had one before? If she has had prior GI she could call there office directly to see what she needs and then let us know.  Would need more info to decide if needs colonoscopy vs cologuard

## 2018-02-21 NOTE — Telephone Encounter (Signed)
[facility] records [facility] at [phone], as pt. Stated in last encounter that she had been seen there for a colonoscopy at some point. Author phoned medical records to confirm but no answer. Awaiting arrival of GI hx forms from [facility].

## 2018-03-02 ENCOUNTER — Encounter: Payer: Self-pay | Admitting: Medical

## 2018-03-02 IMAGING — US US THYROID BIOPSY
1 series · 13 of 25 positions shown · non-contrast
Comparison: US Soft Tissue Head/Neck 12/30/2015

INDICATION: Indeterminate thyroid nodules

Right low pole 3.4 cm x 3.5 cm x 2.0 cm
Left/isthmus 2.2 cm x 2.1 cm x 1.6 cm
EXAM:
ULTRASOUND GUIDED THYROID FINE NEEDLE ASPIRATION x2
TECHNIQUE: Informed written consent was obtained from the patient after a
discussion of the risks, benefits and alternatives to treatment.
Questions regarding the procedure were encouraged and answered. A
timeout was performed prior to the initiation of the procedure.

[Series 1: us thyroid biopsy · 0.08mm/px · 31 acquisitions, 13 frames shown]
[im 1/31]
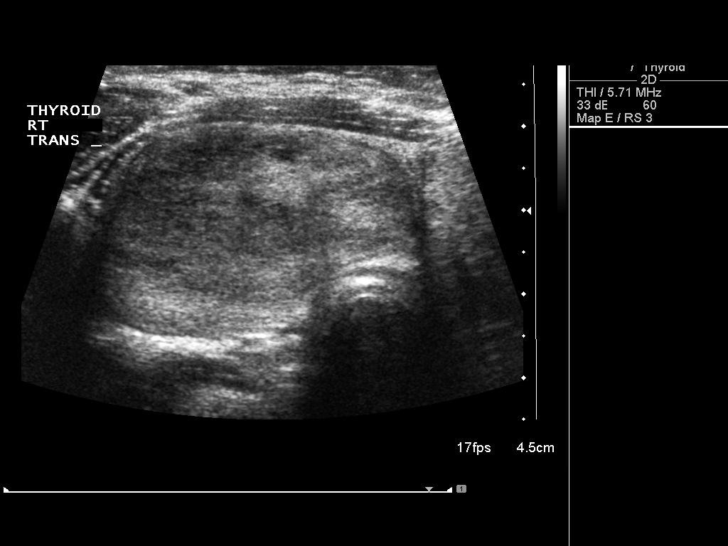
[im 3/31]
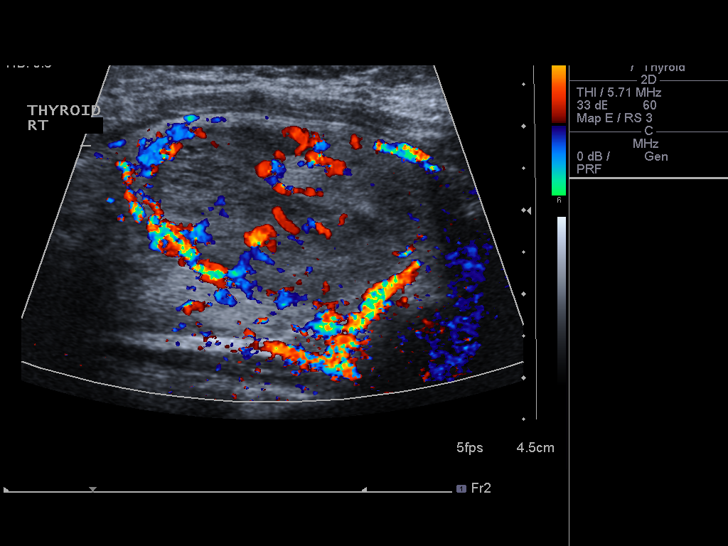
[im 6/31]
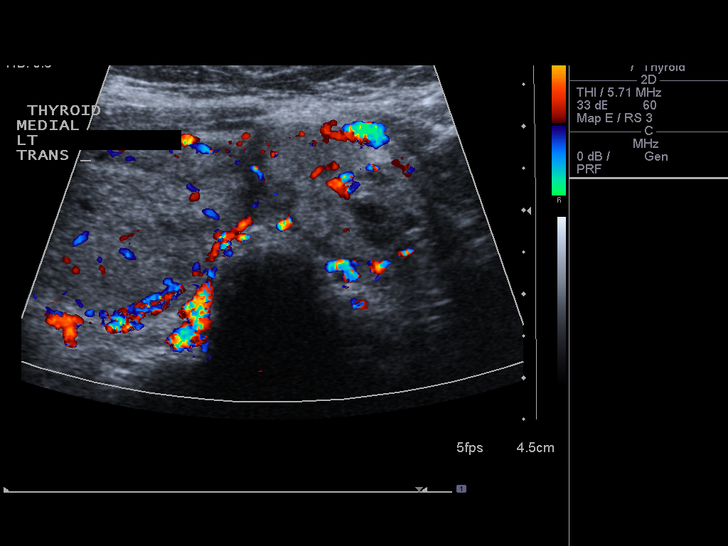
[im 8/31]
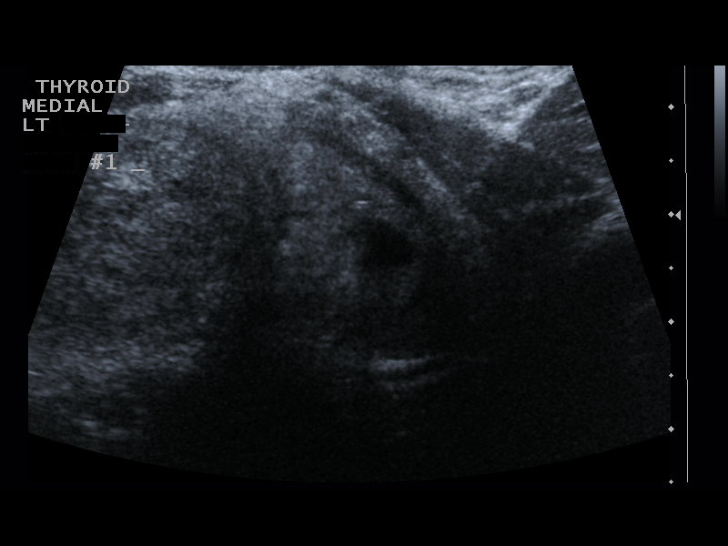
[im 11/31]
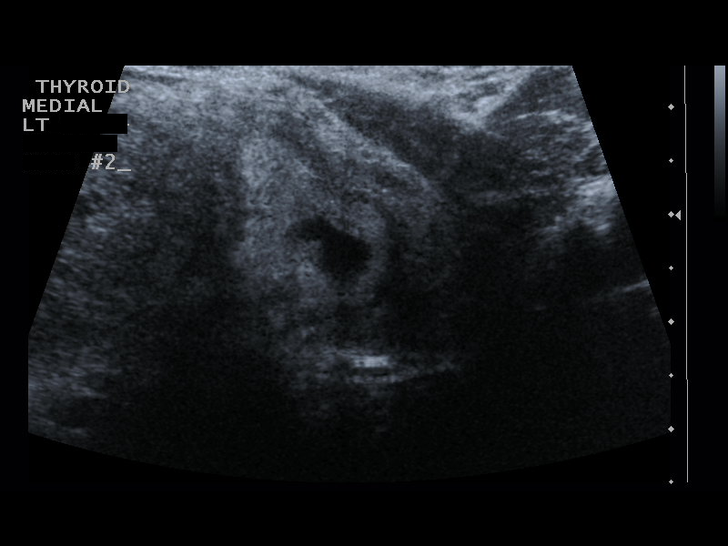
[im 13/31]
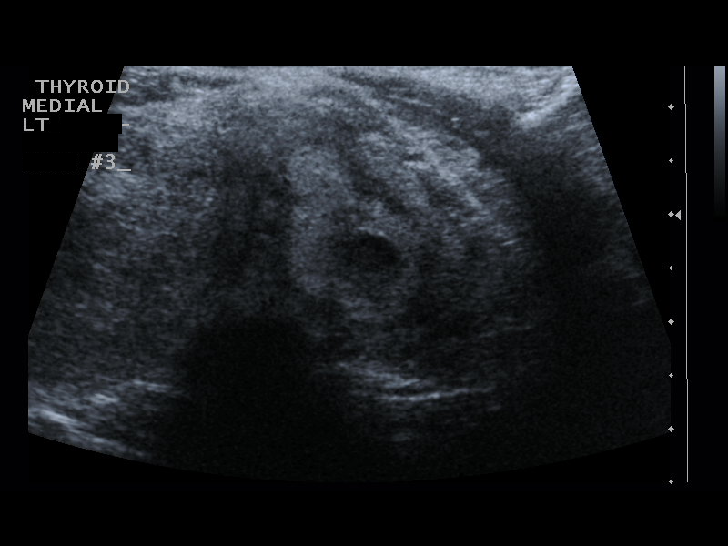
[im 16/31]
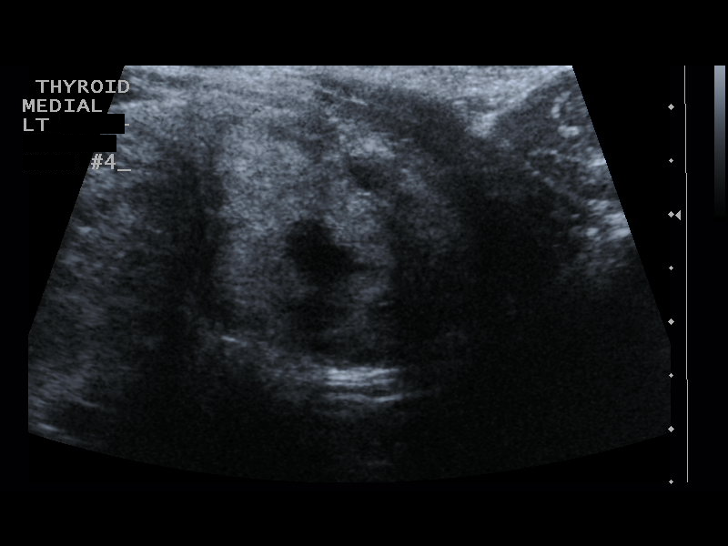
[im 18/31]
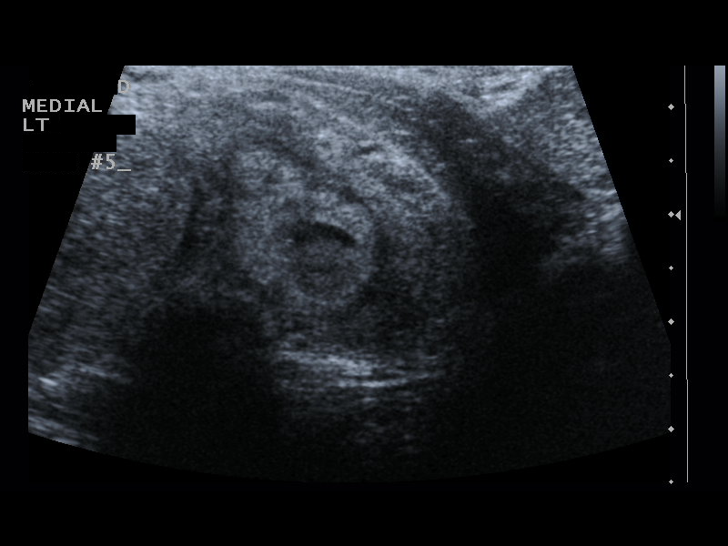
[im 21/31]
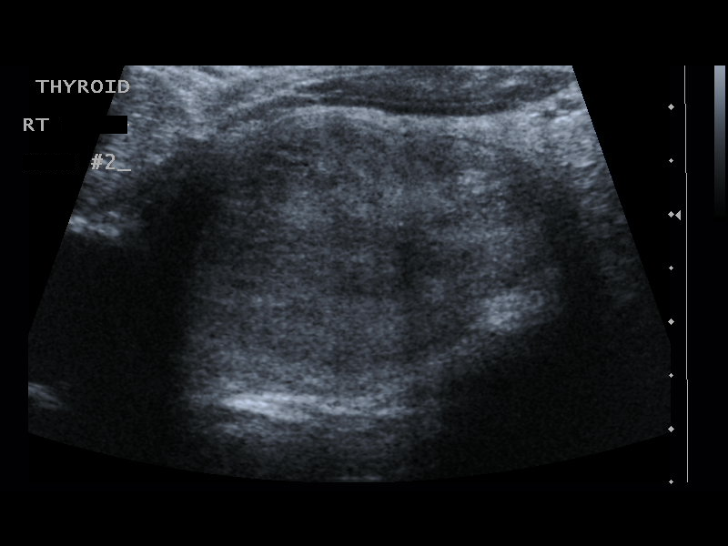
[im 23/31]
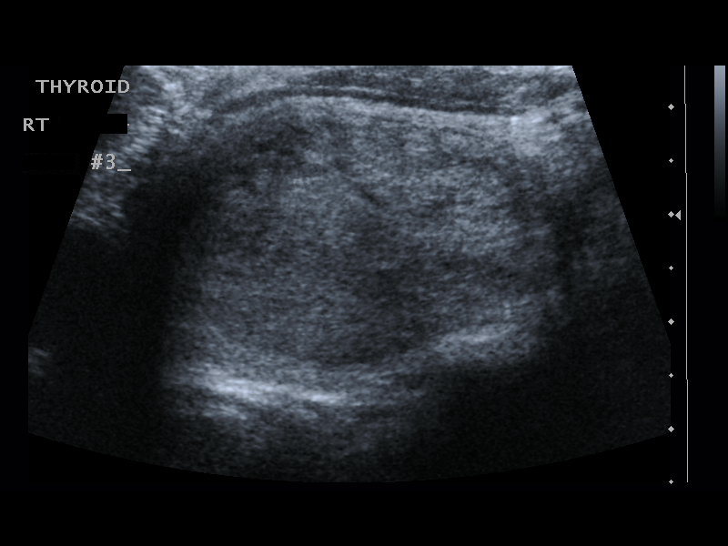
[im 26/31]
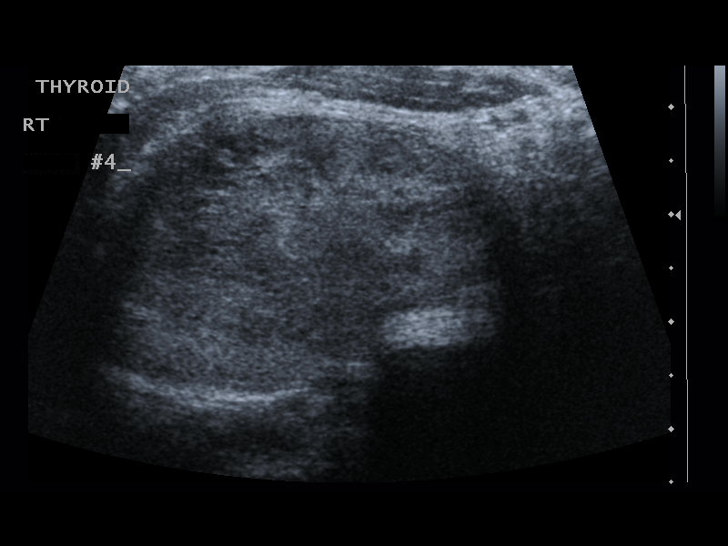
[im 28/31]
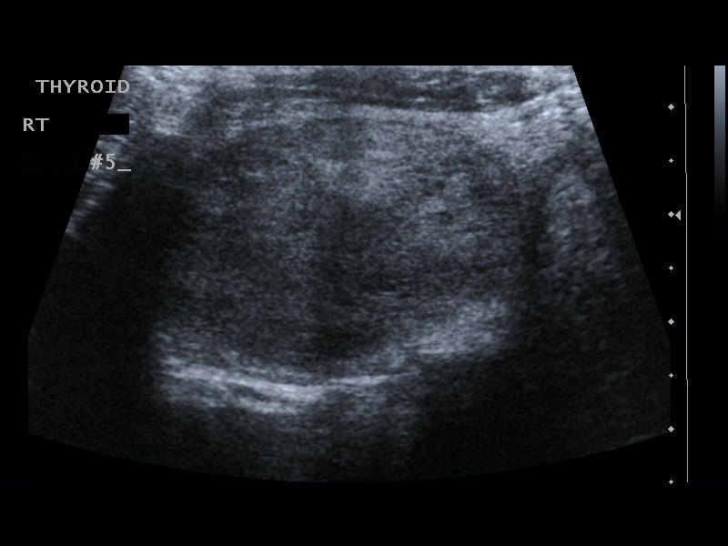
[im 31/31]
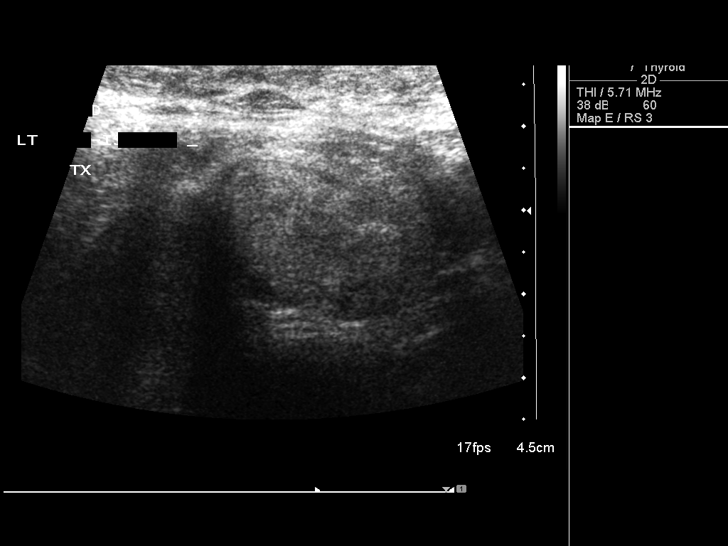

[13 of 25 positions shown; findings below may reference images not displayed]

MEDICATIONS:
5 cc 1% lidocaine; 4 cc 1% lidocaine

COMPLICATIONS:
None immediate.
Pre-procedural ultrasound scanning demonstrated Right lower pole
thyroid nodule and Left/isthmus thyroid nodule

The procedures were planned. The neck was prepped in the usual
sterile fashion, and a sterile drape was applied covering the
operative field. A timeout was performed prior to the initiation of
the procedure. Local anesthesia was provided with 1% lidocaine.

Under direct ultrasound guidance, 3 FNA biopsies were performed of
the right thyroid nodule with a 25 gauge needle.

2 additional FNA biopsies were obtained and sent for AFIRMA per SAWAFUJI.

The samples were prepared and submitted to pathology.

Under direct ultrasound guidance, 3 FNA biopsies were performed of
the left/isthmus thyroid nodule with a 25 gauge needle.

2 additional FNA biopsies were obtained and sent for AFIRMA per SAWAFUJI.

The samples were prepared and submitted to pathology.

Limited post procedural scanning was negative for hematoma or
additional complication. Dressings were placed. The patient
tolerated the above procedures procedure well without immediate
postprocedural complication.
IMPRESSION: Technically successful ultrasound guided fine needle aspiration of
right thyroid nodule and left/isthmus thyroid nodule.

Read by

Pozisa Tiger

## 2018-03-04 ENCOUNTER — Other Ambulatory Visit: Payer: Self-pay | Admitting: Medical

## 2018-03-07 MED ORDER — ZALEPLON 10 MG PO CAPS: 10.0000 mg | ORAL_CAPSULE | Freq: Every evening | ORAL | 0 refills | Status: DC | PRN

## 2018-03-07 MED ORDER — CLONAZEPAM 0.5 MG PO TABS: ORAL_TABLET | ORAL | 0 refills | Status: DC

## 2018-03-07 NOTE — Telephone Encounter (Signed)
Requesting: clonazepam 0.5mg  daily prn Contract: 2018 UDS: 03/2017 Last OV:02/08/18 Next Ov: N/A Last refill: 02/03/18, #30, 0RF Database: no discrepancies found  Pt. Also requesting sonata10mg   refill (Last refill 7/23, #30, 0RF), and meloxicam 7.5mg -15mg  refill (last refilled 7/23, #30, 0RF). Please advise.

## 2018-03-07 NOTE — Telephone Encounter (Signed)
Refilled pt sonata and clonazepam. I did not refill her meloxicam and on chart review MD recently prescribed her advil 800 mg. She can't take 2 nsaids together. Also meloxicam also has potential interaction with sertraline. Increases risk of GI bleed. So decided not to rx meloxicam.

## 2018-03-24 ENCOUNTER — Other Ambulatory Visit: Payer: Self-pay | Admitting: Medical

## 2018-03-24 NOTE — Telephone Encounter (Signed)
Refill Request: Zaleplon & Clonazepam   Last RX:8/08/18/17 Last OV:02/08/18 Next ZO:XWRUOV:none scheduled  UDS:03/17/18 CSC:03/17/17 CSR:  Pt also requesting refill on Mobic last rx 02/01/18

## 2018-03-27 NOTE — Telephone Encounter (Signed)
I did fill patient meloxicam prescription but actually too early. The clonazepam and zaleplon prescription too early. Those were filled on 03-07-2018. I don't want to send controlled meds early until she is do for refill.  Earliest she can fill is on 04-07-2018. She could send refill request on 04-05-2018,

## 2018-04-18 ENCOUNTER — Other Ambulatory Visit: Payer: Self-pay | Admitting: Medical

## 2018-04-19 NOTE — Telephone Encounter (Signed)
Refill request: Zaleplon  Last RX: 03/07/18 Last OV:02/08/18 Next ZO:XWRU scheduled  UDS:03/19/18 CSC:03/19/18 CSR:

## 2018-04-20 NOTE — Telephone Encounter (Signed)
I did send in prescription of Sonata to patient's pharmacy.  Remind patient that when she takes Sonata to help her sleep she is not to use clonazepam at the same time.  Also wanted to clarify and maybe you could ask other staff to see if  Sonata on the controlled medication contract.  Should be listed.  If so for to get back into some new contract so that can be added.

## 2018-04-26 NOTE — Telephone Encounter (Signed)
Left pt a message to call back. Sonata does need to be added to contracted. Pt will need to come in and sgin contract before next refill.

## 2018-05-20 ENCOUNTER — Telehealth: Payer: Self-pay | Admitting: *Deleted

## 2018-05-20 NOTE — Telephone Encounter (Signed)
Received FMLA/STD paperwork from [facility] [employer] [facility], pt last OV 02/08/18, was to return in [2] months; paperwork to provider with note to find if need for OV prior to completing/SLS

## 2018-05-23 ENCOUNTER — Other Ambulatory Visit: Payer: Self-pay | Admitting: Medical

## 2018-05-24 ENCOUNTER — Telehealth: Payer: Self-pay | Admitting: Medical

## 2018-05-24 NOTE — Telephone Encounter (Signed)
Regarding that fmla form. Do we have old copy of the one I filled out in July. Not states I asked for us to make copy.   Pt states she needs frequency changes 2 episodes per week lasting up 3 days per episode.  She is coming in on Friday. So if I can get copy of fmla filled out in July that would be big help.

## 2018-05-24 NOTE — Telephone Encounter (Signed)
On last visit my note states filled out form and faxed. We should have copy of that form. Only 4 months since I filled that out. So not sure why they would be asking for another. So if we have copy of old form. Can you verify with patient nothing has changed in her condition and work not asking for modified answers etc. If same would you fill out the sheet and give it back to me to review and to sign.  Thanks, Esperanza RichtersEdward Donald Memoli, PA-C

## 2018-05-27 ENCOUNTER — Encounter: Payer: Self-pay | Admitting: Medical

## 2018-05-27 ENCOUNTER — Ambulatory Visit: Payer: 59 | Admitting: Medical

## 2018-05-31 ENCOUNTER — Ambulatory Visit (HOSPITAL_BASED_OUTPATIENT_CLINIC_OR_DEPARTMENT_OTHER)
Admission: RE | Admit: 2018-05-31 | Discharge: 2018-05-31 | Disposition: A | Discharge status: Home / Self Care | Payer: 59 | Source: Ambulatory Visit | Attending: Medical | Admitting: Medical

## 2018-05-31 ENCOUNTER — Encounter: Payer: Self-pay | Admitting: Medical

## 2018-05-31 ENCOUNTER — Other Ambulatory Visit: Payer: Self-pay | Admitting: Medical

## 2018-05-31 ENCOUNTER — Ambulatory Visit (INDEPENDENT_AMBULATORY_CARE_PROVIDER_SITE_OTHER): Payer: 59 | Admitting: Medical

## 2018-05-31 VITALS — BP 140/89 | HR 81 | Temp 97.9°F | Resp 16 | Ht 64.0 in | Wt 235.4 lb

## 2018-05-31 DIAGNOSIS — M25512 Pain in left shoulder: Secondary | ICD-10-CM

## 2018-05-31 DIAGNOSIS — G4700 Insomnia, unspecified: Secondary | ICD-10-CM | POA: Diagnosis not present

## 2018-05-31 DIAGNOSIS — F439 Reaction to severe stress, unspecified: Secondary | ICD-10-CM

## 2018-05-31 DIAGNOSIS — Z1231 Encounter for screening mammogram for malignant neoplasm of breast: Secondary | ICD-10-CM | POA: Diagnosis not present

## 2018-05-31 DIAGNOSIS — F329 Major depressive disorder, single episode, unspecified: Secondary | ICD-10-CM

## 2018-05-31 DIAGNOSIS — F419 Anxiety disorder, unspecified: Secondary | ICD-10-CM

## 2018-05-31 DIAGNOSIS — F32A Depression, unspecified: Secondary | ICD-10-CM

## 2018-05-31 DIAGNOSIS — I10 Essential (primary) hypertension: Secondary | ICD-10-CM

## 2018-05-31 DIAGNOSIS — Z79899 Other long term (current) drug therapy: Secondary | ICD-10-CM

## 2018-05-31 MED ORDER — SERTRALINE HCL 50 MG PO TABS: 50.0000 mg | ORAL_TABLET | Freq: Every day | ORAL | 3 refills | Status: DC

## 2018-05-31 MED ORDER — VALSARTAN-HYDROCHLOROTHIAZIDE 320-25 MG PO TABS: 1.0000 | ORAL_TABLET | Freq: Every day | ORAL | 3 refills | Status: DC

## 2018-05-31 MED ORDER — CLONAZEPAM 0.5 MG PO TABS: ORAL_TABLET | ORAL | 0 refills | Status: DC

## 2018-05-31 NOTE — Progress Notes (Addendum)
Subjective:    Patient ID: Janet Case L Cerami, female    DOB: [birth year], 59 y.o.   MRN: [id number]  HPI  Pt is in for follow up. Pt states anxiety level is still high at times with work related situations. She is attempting to get placed in company with less stressful position. She uses clonazepam periodically at work about twice a day. Pt is on sertraline and she thinks may benefit from higher dose.  Pt bp mild high initially but better on recheck. She needs refill of diovan-hct. Pt is on amlodipine.   Pt also has  left shoulder pain x 1 month. When she lifts arm and when lays on left side. No cardiac symptoms associated. No fall or injury. Pain for past month.   Pt has insomnia and sonata has helped. She is using them infrequently.      Review of Systems  Constitutional: Negative for chills, fatigue and fever.  HENT: Negative for congestion, ear pain, hearing loss, mouth sores, nosebleeds, rhinorrhea and sinus pressure.   Respiratory: Negative for chest tightness.   Cardiovascular: Negative for chest pain and palpitations.  Gastrointestinal: Negative for abdominal distention and abdominal pain.  Genitourinary: Negative for dysuria, enuresis and flank pain.  Musculoskeletal: Negative for back pain and myalgias.       Shoulder pain  Skin: Negative for rash.  Neurological: Negative for dizziness and light-headedness.  Hematological: Negative for adenopathy. Does not bruise/bleed easily.  Psychiatric/Behavioral: Positive for dysphoric mood and sleep disturbance. The patient is nervous/anxious.     Past Medical History:  Diagnosis Date  . Anxiety   . Clonic hemifacial spasm    Left Facial  . Colon polyps   . Depression   . Hypertension   . Muscle spasms of head and/or neck      Social History   Socioeconomic History  . Marital status: Divorced    Spouse name: Not on file  . Number of children: 2  . Years of education: Not on file  . Highest education level: Not on file    Occupational History  . Occupation: Occupational psychologistCustomer Service Representative    Comment: [employer]  Social Needs  . Financial resource strain: Not on file  . Food insecurity:    Worry: Not on file    Inability: Not on file  . Transportation needs:    Medical: Not on file    Non-medical: Not on file  Tobacco Use  . Smoking status: Never Smoker  . Smokeless tobacco: Never Used  Substance and Sexual Activity  . Alcohol use: No    Alcohol/week: 0.0 standard drinks  . Drug use: No  . Sexual activity: Never  Lifestyle  . Physical activity:    Days per week: Not on file    Minutes per session: Not on file  . Stress: Not on file  Relationships  . Social connections:    Talks on phone: Not on file    Gets together: Not on file    Attends religious service: Not on file    Active member of club or organization: Not on file    Attends meetings of clubs or organizations: Not on file    Relationship status: Not on file  . Intimate partner violence:    Fear of current or ex partner: Not on file    Emotionally abused: Not on file    Physically abused: Not on file    Forced sexual activity: Not on file  Other Topics Concern  . Not  on file  Social History Narrative  . Not on file    Past Surgical History:  Procedure Laterality Date  . POLYPECTOMY     Oral  . TUBAL LIGATION    . VAGINAL WOUND CLOSURE / REPAIR  1977  . WISDOM TOOTH EXTRACTION      Family History  Problem Relation Age of Onset  . Hyperlipidemia Mother 63       Deceased  . Heart disease Mother   . Congestive Heart Failure Mother   . Hypertension Mother   . Kidney disease Mother   . Diabetes Mother   . Dementia Mother   . Heart defect Mother   . Kidney disease Father 30       Deceased  . Heart attack Father   . Hypertension Sister   . Diabetes Sister   . Breast cancer Sister   . Depression Sister   . Anxiety disorder Sister   . Thyroid cancer Sister        Thyroid Removed  . Leukemia Maternal Aunt   . Allergies  Daughter   . GI problems Daughter     Allergies  Allergen Reactions  . Influenza Vaccines Nausea Only    Current Outpatient Medications on File Prior to Visit  Medication Sig Dispense Refill  . amLODipine (NORVASC) 10 MG tablet TAKE 1 TABLET (10 MG TOTAL) BY MOUTH DAILY. 30 tablet 5  . meloxicam (MOBIC) 7.5 MG tablet TAKE 1 TO 2 TABLETS(7.5 TO 15 MG) BY MOUTH DAILY 30 tablet 0  . topiramate (TOPAMAX) 50 MG tablet TAKE 1 TABLET (50 MG TOTAL) BY MOUTH 2 (TWO) TIMES DAILY. 60 tablet 2  . zaleplon (SONATA) 10 MG capsule TAKE 1 CAPSULE(10 MG) BY MOUTH AT BEDTIME AS NEEDED FOR SLEEP 30 capsule 0   No current facility-administered medications on file prior to visit.     BP 140/89   Pulse 81   Temp 97.9 F (36.6 C) (Oral)   Resp 16   Ht 5\' 4"  (1.626 m)   Wt 235 lb 6.4 oz (106.8 kg)   SpO2 99%   BMI 40.41 kg/m       Objective:   Physical Exam  General Mental Status- Alert. General Appearance- Not in acute distress.   Skin General: Color- Normal Color. Moisture- Normal Moisture.  Neck Carotid Arteries- Normal color. Moisture- Normal Moisture. No carotid bruits. No JVD.  Chest and Lung Exam Auscultation: Breath Sounds:-Normal.  Cardiovascular Auscultation:Rythm- Regular. Murmurs & Other Heart Sounds:Auscultation of the heart reveals- No Murmurs.  Abdomen Inspection:-Inspeection Normal. Palpation/Percussion:Note:No mass. Palpation and Percussion of the abdomen reveal- Non Tender, Non Distended + BS, no rebound or guarding.    Neurologic Cranial Nerve exam:- CN III-XII intact(No nystagmus), symmetric smile. Strength:- 5/5 equal and symmetric strength both upper and lower extremities.  Left shoulder- pain on range of motion. Pain posterior aspect on palpation and on abduction of upper ext.      Assessment & Plan:  For your chronic anxiety related to work with some increased anxiety recently, I did refill your clonazepam today.  I want you to continue same dosing  of clonazepam but increasing sertraline to 50 mg daily.  This is a better approach.  For insomnia, continue with Sonata but recommend using 1 tablet every third night and not using it on the same day you use clonazepam.  Your blood pressure is borderline elevated today.  I want you to get blood pressure cuff over-the-counter and take your blood pressure daily.  If  blood pressure levels are over 140/90 then will need to adjust your medication regimen.  For your left shoulder pain, I put an x-ray of left shoulder to be done today.  I did refill your meloxicam I want you to use low-dose 7.5 mg if needed for the left shoulder pain.  But as I stated this can affect your blood pressure.  So when you do check your blood pressure I want you to document whether or not you took Mobic that day or not.  Update me on those blood pressure readings in 7 to 10 days.  Jasmine/MA did send FMLA paperwork today.  Hopefully your employer/HR will accept that.  If not let us know.  Follow-up in 10 to 14 days or as needed.  Pt gave uds and signed  controlled medicine contract today.  Esperanza Richters, PA-C

## 2018-05-31 NOTE — Patient Instructions (Signed)
For your chronic anxiety related to work with some increased anxiety recently, I did refill your clonazepam today.  I want you to continue same dosing of clonazepam but increasing sertraline to 50 mg daily.  This is a better approach.  For insomnia, continue with Sonata but recommend using 1 tablet every third night and not using it on the same day you use clonazepam.  Your blood pressure is borderline elevated today.  I want you to get blood pressure cuff over-the-counter and take your blood pressure daily.  If blood pressure levels are over 140/90 then will need to adjust your medication regimen.  For your left shoulder pain, I put an x-ray of left shoulder to be done today.  I did refill your meloxicam I want you to use low-dose 7.5 mg if needed for the left shoulder pain.  But as I stated this can affect your blood pressure.  So when you do check your blood pressure I want you to document whether or not you took Mobic that day or not.  Update me on those blood pressure readings in 7 to 10 days.  Jasmine/MA did send FMLA paperwork today.  Hopefully your employer/HR will accept that.  If not let us know.  Follow-up in 10 to 14 days or as needed.

## 2018-06-01 ENCOUNTER — Telehealth: Payer: Self-pay | Admitting: Medical

## 2018-06-01 ENCOUNTER — Encounter: Payer: Self-pay | Admitting: Medical

## 2018-06-01 NOTE — Telephone Encounter (Signed)
Will you check on patient uds. I don't they ran the test because order did not indicate what medication she was on? This was message I got through my chart?  Also on lab result stated error. So if she did not get uds will you reorder that and have her come in to give sample again. She is on clonazepam.

## 2018-06-02 ENCOUNTER — Telehealth: Payer: Self-pay | Admitting: Medical

## 2018-06-02 ENCOUNTER — Ambulatory Visit: Payer: 59 | Admitting: Medical

## 2018-06-02 MED ORDER — TOPIRAMATE 50 MG PO TABS: 50.0000 mg | ORAL_TABLET | Freq: Two times a day (BID) | ORAL | 2 refills | Status: DC

## 2018-06-02 MED ORDER — ZALEPLON 10 MG PO CAPS: ORAL_CAPSULE | ORAL | 0 refills | Status: DC

## 2018-06-02 NOTE — Telephone Encounter (Signed)
rx sonata sent to pharmacy.

## 2018-06-02 NOTE — Telephone Encounter (Signed)
rx topamax sent to pt pharmacy.

## 2018-06-03 LAB — PAIN MGMT, PROFILE 8 W/CONF, U
6 ACETYLMORPHINE: NEGATIVE ng/mL (ref <10)
AMPHETAMINES: NEGATIVE ng/mL (ref <500)
Alcohol Metabolites: NEGATIVE ng/mL (ref <500)
BUPRENORPHINE, URINE: NEGATIVE ng/mL (ref <5)
Benzodiazepines: NEGATIVE ng/mL (ref <100)
COCAINE METABOLITE: NEGATIVE ng/mL (ref <150)
Creatinine: 48.6 mg/dL
MDMA: NEGATIVE ng/mL (ref <500)
Marijuana Metabolite: NEGATIVE ng/mL (ref <20)
Noroxycodone: NEGATIVE ng/mL (ref <50)
OXYCODONE: NEGATIVE ng/mL (ref <100)
OXYCODONE: NEGATIVE ng/mL (ref <50)
OXYMORPHONE: NEGATIVE ng/mL (ref <50)
Opiates: NEGATIVE ng/mL (ref <100)
Oxidant: NEGATIVE ug/mL (ref <200)
PH: 6.1 (ref 4.5–9.0)

## 2018-06-03 LAB — TIQ-AOE

## 2018-06-30 ENCOUNTER — Other Ambulatory Visit: Payer: Self-pay | Admitting: Medical

## 2018-07-11 ENCOUNTER — Telehealth: Payer: Self-pay | Admitting: Medical

## 2018-07-11 ENCOUNTER — Encounter: Payer: Self-pay | Admitting: Medical

## 2018-07-11 ENCOUNTER — Other Ambulatory Visit: Payer: Self-pay | Admitting: Medical

## 2018-07-11 MED ORDER — CLONAZEPAM 0.5 MG PO TABS: ORAL_TABLET | ORAL | 0 refills | Status: DC

## 2018-07-11 MED ORDER — ZALEPLON 10 MG PO CAPS: ORAL_CAPSULE | ORAL | 0 refills | Status: DC

## 2018-07-11 NOTE — Telephone Encounter (Signed)
Rx sonata and klonopin sent to patient pharmacy.

## 2018-07-11 NOTE — Telephone Encounter (Signed)
Opened to review 

## 2018-07-22 ENCOUNTER — Encounter: Payer: Self-pay | Admitting: Medical

## 2018-07-23 ENCOUNTER — Telehealth: Payer: Self-pay | Admitting: Medical

## 2018-07-23 MED ORDER — BENZONATATE 100 MG PO CAPS: 100.0000 mg | ORAL_CAPSULE | Freq: Three times a day (TID) | ORAL | 0 refills | Status: DC | PRN

## 2018-07-23 NOTE — Telephone Encounter (Signed)
Rx benzonatate sent to pt pharmacy at her request. See my chart message.

## 2018-07-28 ENCOUNTER — Telehealth: Payer: Self-pay | Admitting: Medical

## 2018-07-28 MED ORDER — TOPIRAMATE 50 MG PO TABS: 50.0000 mg | ORAL_TABLET | Freq: Two times a day (BID) | ORAL | 2 refills | Status: DC

## 2018-07-28 NOTE — Telephone Encounter (Signed)
Refilled pt topamax.

## 2018-08-26 ENCOUNTER — Telehealth: Payer: Self-pay

## 2018-08-26 NOTE — Telephone Encounter (Signed)
Refill request: Clonazepam  Last RX:07/11/18 Last OV:05/31/18 Next OV: None scheduled  UDS:05/23/18 CSC:05/31/18 CSR:

## 2018-08-27 MED ORDER — CLONAZEPAM 0.5 MG PO TABS: ORAL_TABLET | ORAL | 0 refills | Status: DC

## 2018-08-27 NOTE — Telephone Encounter (Signed)
Refilled her clonazepam. Controlled substance/narx review done today. Can you make note of that.

## 2018-09-13 ENCOUNTER — Other Ambulatory Visit: Payer: Self-pay | Admitting: Medical

## 2018-09-14 IMAGING — CR DG CHEST 2V
2 series · 2 of 2 positions shown · non-contrast
Comparison: None.

CLINICAL DATA: 55-year-old female with cough and wheezing for 1
month. Initial encounter.

EXAM:
CHEST  2 VIEW

[w chest pa]
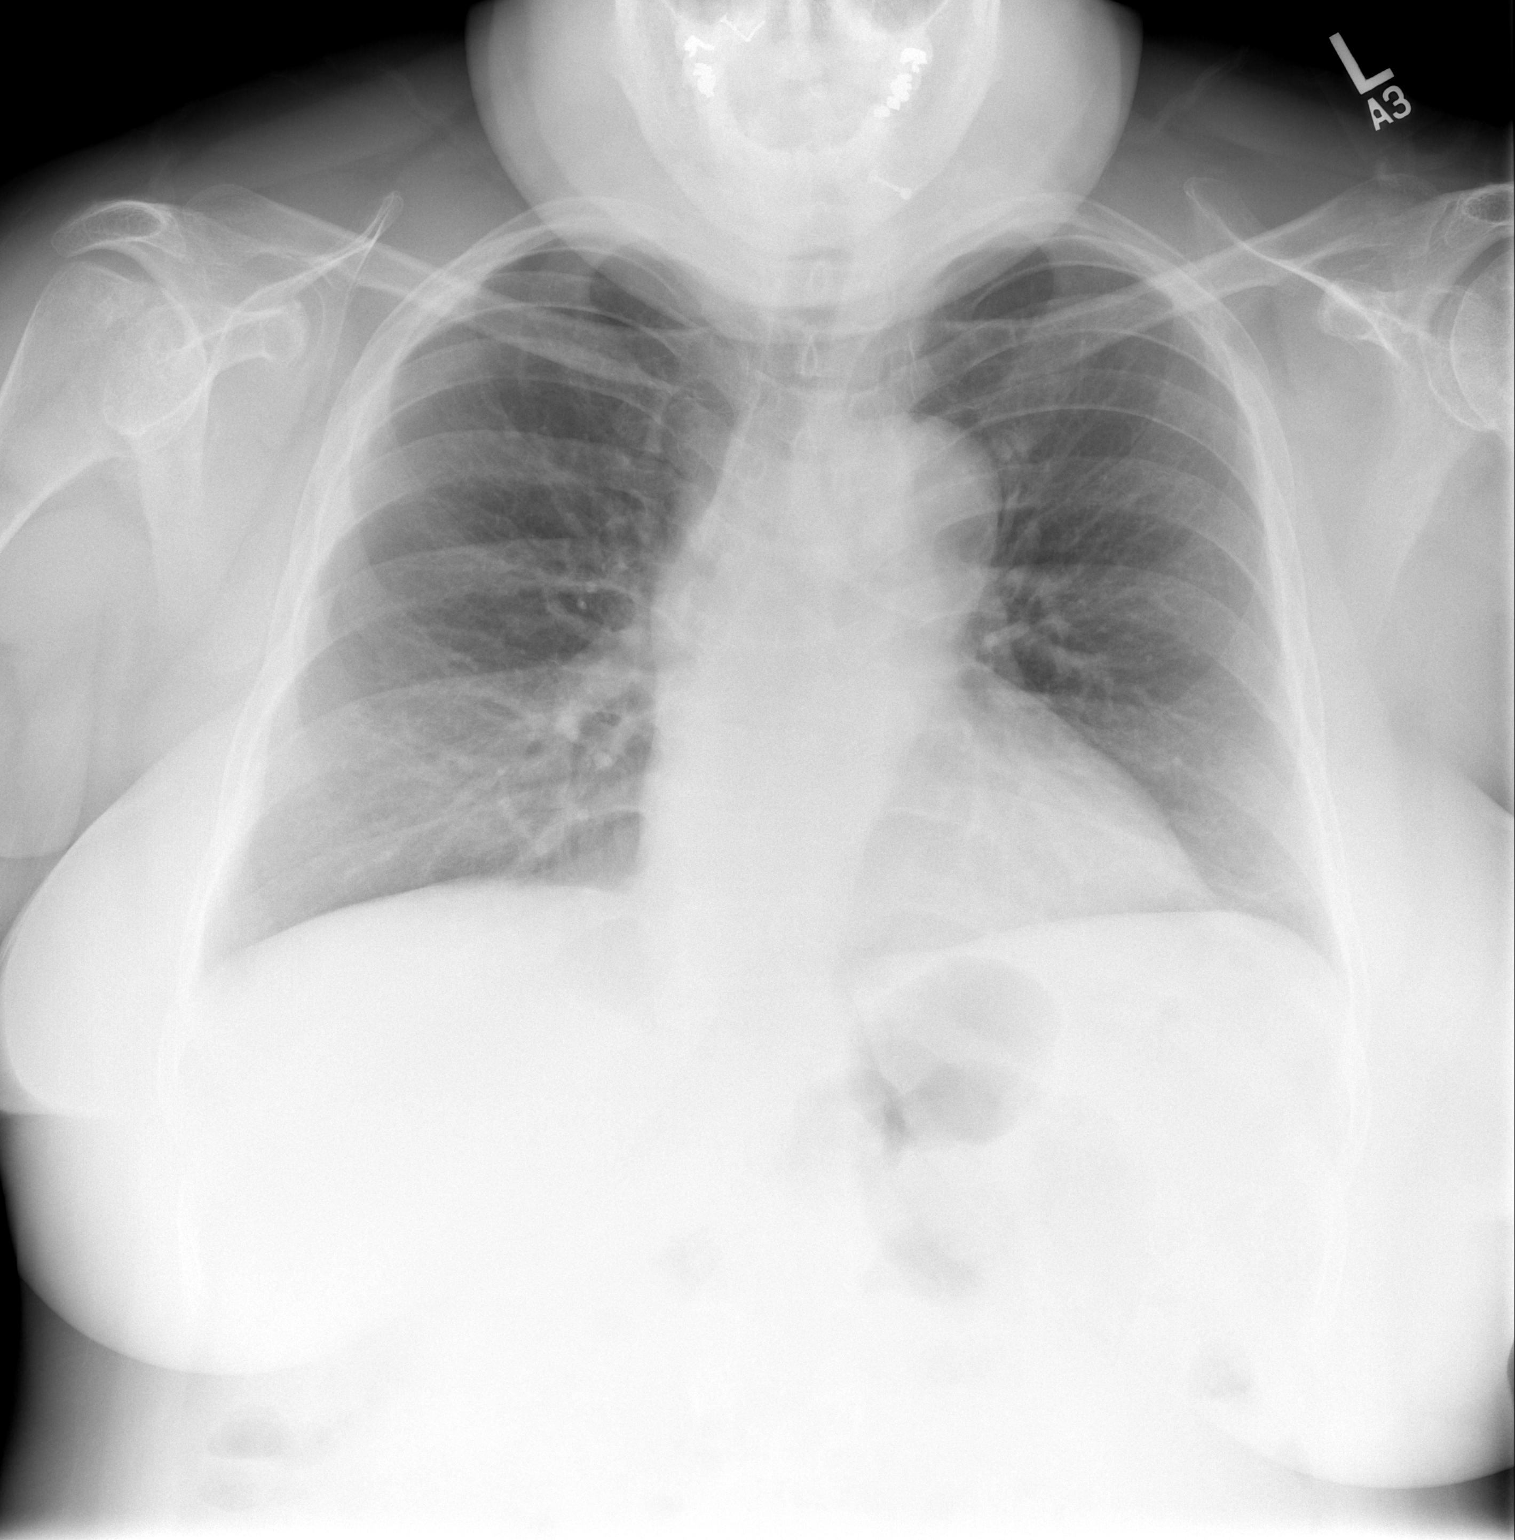

[w chest lat]
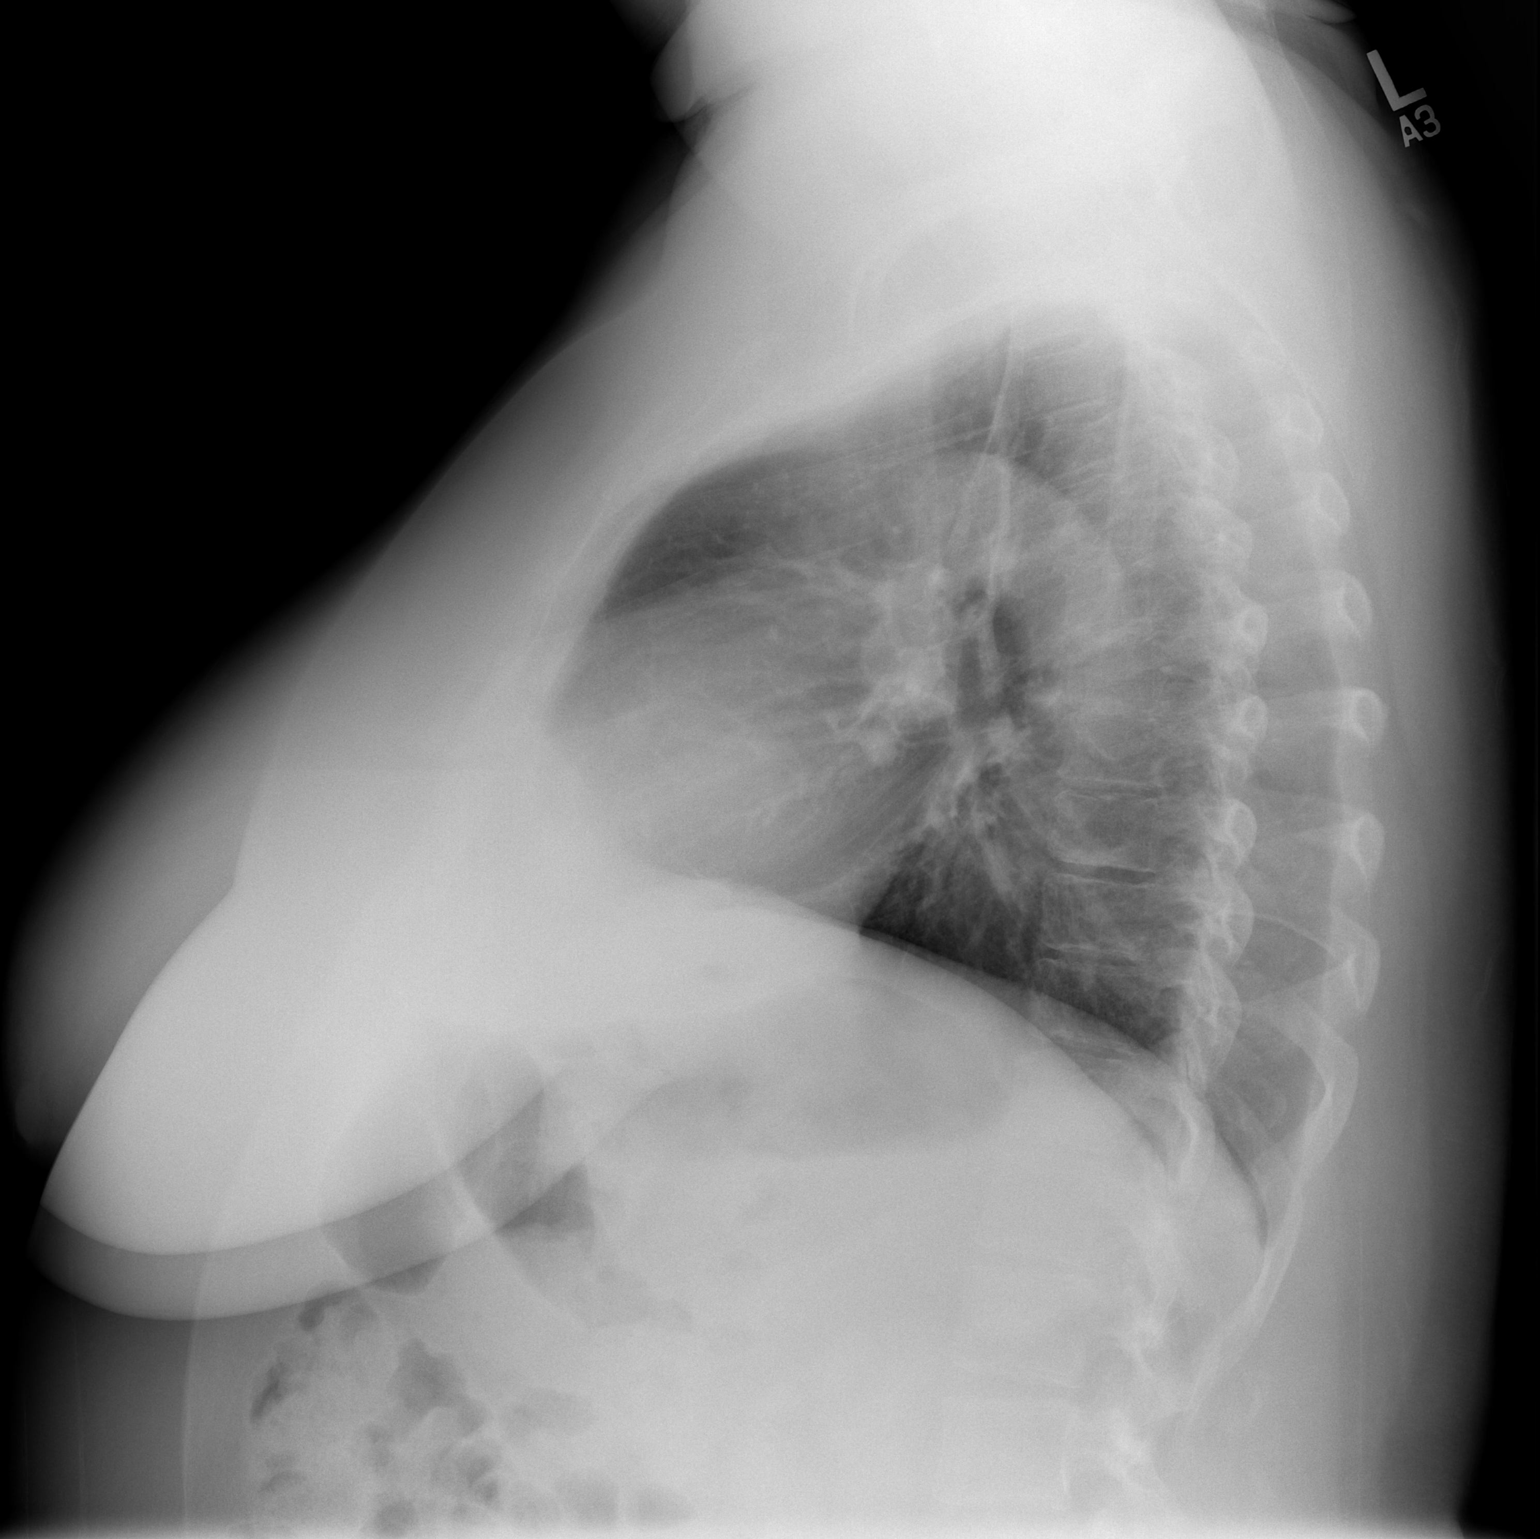

[2 of 2 positions shown; findings below may reference images not displayed]

FINDINGS: Large body habitus. Low normal lung volumes. Mild tortuosity of the
thoracic aorta. Other mediastinal contours are within normal limits.
Visualized tracheal air column is within normal limits. No
pneumothorax, pulmonary edema, pleural effusion or confluent
pulmonary opacity. No acute osseous abnormality identified. Mild
dextroconvex thoracic spine curvature. Negative visible bowel gas
pattern.
IMPRESSION: Somewhat low lung volumes.  No acute cardiopulmonary abnormality.

## 2018-09-14 NOTE — Telephone Encounter (Signed)
Refill request: Zaleplon     Last RX:07/11/18 Last OV:11/19//19 Next KN:[id number] scheduled  UDS:05/31/18 CSC:05/31/18 CSR:

## 2018-09-15 ENCOUNTER — Telehealth: Payer: Self-pay | Admitting: Medical

## 2018-09-15 NOTE — Telephone Encounter (Signed)
Rx ambien sent to pt pharmacy. 

## 2018-09-15 NOTE — Telephone Encounter (Signed)
Opened by accident

## 2018-10-11 ENCOUNTER — Other Ambulatory Visit: Payer: Self-pay | Admitting: Medical

## 2018-10-13 ENCOUNTER — Other Ambulatory Visit: Payer: Self-pay

## 2018-10-13 ENCOUNTER — Ambulatory Visit (INDEPENDENT_AMBULATORY_CARE_PROVIDER_SITE_OTHER): Payer: 59 | Admitting: Medical

## 2018-10-13 DIAGNOSIS — I10 Essential (primary) hypertension: Secondary | ICD-10-CM

## 2018-10-13 DIAGNOSIS — F419 Anxiety disorder, unspecified: Secondary | ICD-10-CM | POA: Diagnosis not present

## 2018-10-13 DIAGNOSIS — F439 Reaction to severe stress, unspecified: Secondary | ICD-10-CM | POA: Diagnosis not present

## 2018-10-13 DIAGNOSIS — M25512 Pain in left shoulder: Secondary | ICD-10-CM

## 2018-10-13 DIAGNOSIS — G8929 Other chronic pain: Secondary | ICD-10-CM

## 2018-10-13 NOTE — Patient Instructions (Signed)
Patient's blood pressure recently very well controlled on last 2 readings.  Unfortunately she needs new batteries presently so could not give BP readings today.  Continue current blood pressure medicine regimen.  For recent chronic left shoulder pain since November, advised patient she can use meloxicam I wrote on the last visit and I will go ahead and make referral to sports medicine.  For anxiety and stress, patient appears to be handling this relatively well despite circumstances with the viral pandemic.  She does have clonazepam prescription available to use as needed.  Follow-up in approximately 3 months in the office or potentially sooner.

## 2018-10-13 NOTE — Progress Notes (Signed)
Subjective:    Patient ID: Janet Case, female    DOB: [birth year], 58 y.o.   MRN: [id number]  HPI  Virtual Visit via Video Note  I connected with Khaya L Dart on 10/13/18 at  2:00 PM EDT by a video enabled telemedicine application and verified that I am speaking with the correct person using two identifiers.   I discussed the limitations of evaluation and management by telemedicine and the availability of in person appointments. The patient expressed understanding and agreed to proceed.  No vital signs given. bp monitor out of batteries.   History of Present Illness:   Pt bp recently 126/78 and 120/70 about 3 days ago. Pt battery ran out today. She is getting new battery today and will my chart me readings over next week.   Pt has recent stress relate to work and virus. She is doing work from home and her daughter is working in dialysis. Work volume little decreased. She states has clonazepam. Handling stress and anxiety well.  Pt has hx of left shoulder pain since november. Pain most prominent laying on left shoulder. Pain on range of motionas well. Pain now for about 5 months. No associated chest pain. Xray was negative in November. I had given meloxicam on last visit.    Observations/Objective: No acute distress.  On video she demonstates has range of motion but states some pain on rom.  Assessment and Plan:  Patient's blood pressure recently very well controlled on last 2 readings.  Unfortunately she needs new batteries presently so could not give BP readings today.  Continue current blood pressure medicine regimen.  For recent chronic left shoulder pain since November, advised patient she can use meloxicam I wrote on the last visit and I will go ahead and make referral to sports medicine.  For anxiety and stress, patient appears to be handling this relatively well despite circumstances with the viral pandemic.  She does have clonazepam prescription available to use as  needed.  Follow-up in approximately 3 months in the office or potentially sooner. Follow Up Instructions:    I discussed the assessment and treatment plan with the patient. The patient was provided an opportunity to ask questions and all were answered. The patient agreed with the plan and demonstrated an understanding of the instructions.   The patient was advised to call back or seek an in-person evaluation if the symptoms worsen or if the condition fails to improve as anticipated.  I provided 25 minutes of non-face-to-face time during this encounter.   Esperanza Richters, PA-C   Review of Systems  Respiratory: Negative for cough, chest tightness, shortness of breath and wheezing.   Cardiovascular: Negative for chest pain and palpitations.  Musculoskeletal:       Left shoulder pain.  Neurological: Negative for dizziness, weakness, numbness and headaches.  Hematological: Negative for adenopathy. Does not bruise/bleed easily.  Psychiatric/Behavioral: Negative for suicidal ideas. The patient is nervous/anxious.    Past Medical History:  Diagnosis Date  . Anxiety   . Clonic hemifacial spasm    Left Facial  . Colon polyps   . Depression   . Hypertension   . Muscle spasms of head and/or neck      Social History   Socioeconomic History  . Marital status: Divorced    Spouse name: Not on file  . Number of children: 2  . Years of education: Not on file  . Highest education level: Not on file  Occupational History  . Occupation:  Customer Service Representative    Comment: [employer]  Social Needs  . Financial resource strain: Not on file  . Food insecurity:    Worry: Not on file    Inability: Not on file  . Transportation needs:    Medical: Not on file    Non-medical: Not on file  Tobacco Use  . Smoking status: Never Smoker  . Smokeless tobacco: Never Used  Substance and Sexual Activity  . Alcohol use: No    Alcohol/week: 0.0 standard drinks  . Drug use: No  . Sexual  activity: Never  Lifestyle  . Physical activity:    Days per week: Not on file    Minutes per session: Not on file  . Stress: Not on file  Relationships  . Social connections:    Talks on phone: Not on file    Gets together: Not on file    Attends religious service: Not on file    Active member of club or organization: Not on file    Attends meetings of clubs or organizations: Not on file    Relationship status: Not on file  . Intimate partner violence:    Fear of current or ex partner: Not on file    Emotionally abused: Not on file    Physically abused: Not on file    Forced sexual activity: Not on file  Other Topics Concern  . Not on file  Social History Narrative  . Not on file    Past Surgical History:  Procedure Laterality Date  . POLYPECTOMY     Oral  . TUBAL LIGATION    . VAGINAL WOUND CLOSURE / REPAIR  1977  . WISDOM TOOTH EXTRACTION      Family History  Problem Relation Age of Onset  . Hyperlipidemia Mother [id number]       Deceased  . Heart disease Mother   . Congestive Heart Failure Mother   . Hypertension Mother   . Kidney disease Mother   . Diabetes Mother   . Dementia Mother   . Heart defect Mother   . Kidney disease Father [id number]       Deceased  . Heart attack Father   . Hypertension Sister   . Diabetes Sister   . Breast cancer Sister   . Depression Sister   . Anxiety disorder Sister   . Thyroid cancer Sister        Thyroid Removed  . Leukemia Maternal Aunt   . Allergies Daughter   . GI problems Daughter     Allergies  Allergen Reactions  . Influenza Vaccines Nausea Only    Current Outpatient Medications on File Prior to Visit  Medication Sig Dispense Refill  . amLODipine (NORVASC) 10 MG tablet TAKE 1 TABLET (10 MG TOTAL) BY MOUTH DAILY. 30 tablet 5  . benzonatate (TESSALON) 100 MG capsule Take 1 capsule (100 mg total) by mouth 3 (three) times daily as needed for cough. 30 capsule 0  . clonazePAM (KLONOPIN) 0.5 MG tablet 1 tablet by mouth daily  when necessary anxiety 30 tablet 0  . meloxicam (MOBIC) 7.5 MG tablet TAKE 1 TO 2 TABLETS(7.5 TO 15 MG) BY MOUTH DAILY 30 tablet 3  . sertraline (ZOLOFT) 50 MG tablet Take 1 tablet (50 mg total) by mouth daily. 30 tablet 3  . topiramate (TOPAMAX) 50 MG tablet Take 1 tablet (50 mg total) by mouth 2 (two) times daily. 60 tablet 2  . valsartan-hydrochlorothiazide (DIOVAN-HCT) 320-25 MG tablet Take 1 tablet by mouth daily.  30 tablet 3  . zaleplon (SONATA) 10 MG capsule TAKE 1 CAPSULE(10 MG) BY MOUTH AT BEDTIME AS NEEDED FOR SLEEP 30 capsule 1   No current facility-administered medications on file prior to visit.     There were no vitals taken for this visit.     Objective:   Physical Exam na       Assessment & Plan:

## 2018-10-14 ENCOUNTER — Other Ambulatory Visit: Payer: Self-pay | Admitting: Medical

## 2018-10-15 MED ORDER — CLONAZEPAM 0.5 MG PO TABS: ORAL_TABLET | ORAL | 0 refills | Status: DC

## 2018-10-17 ENCOUNTER — Ambulatory Visit: Payer: 59 | Admitting: Family Medicine

## 2018-10-25 ENCOUNTER — Encounter: Payer: Self-pay | Admitting: Medical

## 2018-10-25 ENCOUNTER — Other Ambulatory Visit: Payer: Self-pay

## 2018-10-25 ENCOUNTER — Ambulatory Visit (INDEPENDENT_AMBULATORY_CARE_PROVIDER_SITE_OTHER): Payer: 59 | Admitting: Medical

## 2018-10-25 NOTE — Telephone Encounter (Signed)
Virtual visit scheduled.  

## 2018-10-26 ENCOUNTER — Encounter: Payer: Self-pay | Admitting: Family Medicine

## 2018-10-26 ENCOUNTER — Ambulatory Visit (INDEPENDENT_AMBULATORY_CARE_PROVIDER_SITE_OTHER): Payer: 59 | Admitting: Family Medicine

## 2018-10-26 ENCOUNTER — Other Ambulatory Visit: Payer: Self-pay

## 2018-10-26 VITALS — BP 136/99 | HR 98 | Temp 98.6°F | Ht 64.0 in | Wt 225.0 lb

## 2018-10-26 DIAGNOSIS — M62830 Muscle spasm of back: Secondary | ICD-10-CM | POA: Insufficient documentation

## 2018-10-26 DIAGNOSIS — M25512 Pain in left shoulder: Secondary | ICD-10-CM | POA: Diagnosis not present

## 2018-10-26 DIAGNOSIS — M7552 Bursitis of left shoulder: Secondary | ICD-10-CM | POA: Diagnosis not present

## 2018-10-26 DIAGNOSIS — G8929 Other chronic pain: Secondary | ICD-10-CM | POA: Diagnosis not present

## 2018-10-26 MED ORDER — METHYLPREDNISOLONE ACETATE 40 MG/ML IJ SUSP
40.0000 mg | Freq: Once | INTRAMUSCULAR | Status: AC
  Administered 2018-10-26: 15:00:00 40 mg

## 2018-10-26 NOTE — Progress Notes (Signed)
Janet Case - 58 y.o. female MRN [id number]  Date of birth: 1961-04-25   Chief complaint: L shoulder pain R mid back pain  SUBJECTIVE:    History of present illness: 58yo female who presents today as a new patient with the cc of L lateral shoulder pain and mid right sided back pain. For her shoulder, her symptoms have been present since November 2019. Denies injury or trauma to the area. Her symptoms since then have progressed and worsened. She describes her pain has dull, rated 7/10. It is worse at night and it is not keeping her from laying on her L shoulder. It is also worse with lifting exercises. She had x-rays back in November from her PCP which were negative for any process. She was placed on 7.5mg  of Meloxicam and states the Meloxicam does help, however she continues to be in pain. Denies any loss of grip strength or weakness. No neck pain, numbness, or tingling of the LUE.  For her back, her symptoms have been present for 1 week. She denies any injury or trauma to the area. She localizes her pain the the R periscapular region. It is worse with overhead movements. It improves with rest, heat, and meloxicam. She rates her pain 2/10 and it is improving. No associated pain, numbness, or tingling in her RUE. No symptoms of chest pain, rib pain, or shortness of breath. No rashes. No restrictions in motion.   Review of systems:  Per HPI; in addition no fever, no rash, no additional weakness, no additional numbness, no additional paresthesias, and no additional fall/injury   Past medical history: hypertension, anxiety, depression Past surgical history: tubal ligation, wisdom tooth extraction Past family history: Anxiety, diabetes, HTN, HLP, Leukemia Social history: nonsmoker  Medications: amlodipine, meloxicam, zoloft, topamax, valsartan-hctz, sonata Allergies: none  OBJECTIVE:  Physical exam: Vital signs are reviewed. BP (!) 136/99   Pulse 98   Temp 98.6 F (37 C) (Oral)   Ht 5'  4" (1.626 m)   Wt 225 lb (102.1 kg)   BMI 38.62 kg/m   Gen.: Alert, oriented, appears stated age, in no apparent distress Integumentary: No rashes or ecchymosis Neurologic: sensation intact to light touch C5-T1, negative spurlings test Back: no TTP of neck or SCM. TTP in R paraspinal muscles at T3-T5. Tenderness improves with pressure and flexion of the right arm. Psych: Normal affect, mood is described as good Musculoskeletal: Inspection of L shoulder demonstrates no acute abnormality. No tenderness to palpation in the bicipital groove. Active ROM 170 degrees flexion, 170 degrees abduction with mild limitation 2/2 to pain. 15 degrees of external rotation present with moderate limitation passively. Strength 4.5/5 of supraspinatus. 5/5 of infraspinatus. 5/5 teres minor. + Neers test. +Hawkins test. Negative Empty Can. Neg Cross arm test. Neg Speeds test. Neg Yergasons test. N/v intact.    ASSESSMENT & PLAN: Subacromial bursitis of left shoulder joint INJECTION: Patient was given informed consent, signed copy in the chart. Appropriate time out was taken. Area prepped and draped in usual sterile fashion. 1 cc of methylprednisolone 40 mg/ml plus  3 cc of sensoricaine without epinephrine was injected into the subacromial space of the L shoulder using a(n) ultrasound guided lateral approach. The patient tolerated the procedure well. There were no complications. Post procedure instructions were given.  HEP given for rotator cuff strengthening to be performed daily for the next 4-6 weeks. Meloxicam 7.5mg  1-2 pills daily as needed for pain is acceptable for this problem. Counseled on common and severe side  effects. Reviewed independently x-rays of the L shoulder from Nov 2019 demonstrating no acute process. Follow up in 6 weeks if still symptomatic.  Paraspinal muscle spasm - located R thoracic region, point tenderness to palpation - discussed likely compensatory to left shoulder dilemma - continue  meloxicam and may increase to 15mg  daily for 1-2 weeks - continue heating pad - will include in HEP paraspinal thoracic back stretches     Gustavus MessingAJ Preciliano Castell, DO Sports Medicine Fellow [facility]

## 2018-10-26 NOTE — Patient Instructions (Signed)
Today we saw you for your L shoulder pain. We diagnosed you with Bursitis of the shoulder, inflammation.  We are recommending performing strengthening exercises at home daily for the next 4-6 weeks. We performed a steroid injection of your shoulder today. The numbing medicine will wear off in the next few hrs. It may take 2d up to 1 week to start working.  For your back, we gave you stretches and strengthening exercises as well. Perform these daily. Continue with a heating pad. You may take up to 15mg  of Meloxicam daily if needed for anti-inflammatory properties. Follow up in 4-6 weeks.

## 2018-10-26 NOTE — Assessment & Plan Note (Signed)
-   located R thoracic region, point tenderness to palpation - discussed likely compensatory to left shoulder dilemma - continue meloxicam and may increase to 15mg  daily for 1-2 weeks - continue heating pad - will include in HEP paraspinal thoracic back stretches

## 2018-10-26 NOTE — Assessment & Plan Note (Signed)
INJECTION: Patient was given informed consent, signed copy in the chart. Appropriate time out was taken. Area prepped and draped in usual sterile fashion. 1 cc of methylprednisolone 40 mg/ml plus  3 cc of sensoricaine without epinephrine was injected into the subacromial space of the L shoulder using a(n) ultrasound guided lateral approach. The patient tolerated the procedure well. There were no complications. Post procedure instructions were given.  HEP given for rotator cuff strengthening to be performed daily for the next 4-6 weeks. Meloxicam 7.5mg  1-2 pills daily as needed for pain is acceptable for this problem. Counseled on common and severe side effects. Reviewed independently x-rays of the L shoulder from Nov 2019 demonstrating no acute process. Follow up in 6 weeks if still symptomatic.

## 2018-10-28 ENCOUNTER — Encounter: Payer: Self-pay | Admitting: Family Medicine

## 2018-11-10 ENCOUNTER — Encounter: Payer: Self-pay | Admitting: Family Medicine

## 2018-11-23 ENCOUNTER — Other Ambulatory Visit: Payer: Self-pay | Admitting: Medical

## 2018-11-24 NOTE — Telephone Encounter (Signed)
Rx clonazepam sent to pt pharmacy. 

## 2018-11-24 NOTE — Telephone Encounter (Signed)
Refill Request: Clonazepam  Last RX:10/15/18 Last OV:10/13/18 Next TF:TDDU scheduled  UDS:06/01/19 CSC:06/01/19 CSR:

## 2018-11-30 ENCOUNTER — Ambulatory Visit: Payer: 59 | Admitting: Family Medicine

## 2018-12-24 ENCOUNTER — Other Ambulatory Visit: Payer: Self-pay | Admitting: Medical

## 2018-12-27 NOTE — Telephone Encounter (Signed)
Rx sonata sent to pt pharmacy.

## 2018-12-27 NOTE — Telephone Encounter (Signed)
Refill Request: Zaleplon  Last RX: 09/15/18 Last OV:10/25/18 Next PJ:ASNK Scheduled  UDS:05/31/18 CSC:05/31/18 CSR:

## 2019-01-21 ENCOUNTER — Other Ambulatory Visit: Payer: Self-pay | Admitting: Family Medicine

## 2019-01-21 ENCOUNTER — Other Ambulatory Visit: Payer: Self-pay | Admitting: Medical

## 2019-02-02 ENCOUNTER — Other Ambulatory Visit: Payer: Self-pay | Admitting: Medical

## 2019-02-03 NOTE — Telephone Encounter (Addendum)
Refill Request: Zaleplon  Last RX:12/27/18 Last OV:10/13/18 Next OV: None scheduled  UDS:05/31/18 CSC:05/31/18 CSR: Reviewed controled web site today.  She need virtual visit in one month. Please get her scheduled.

## 2019-02-17 ENCOUNTER — Encounter: Payer: Self-pay | Admitting: Medical

## 2019-02-21 ENCOUNTER — Ambulatory Visit: Payer: 59 | Admitting: Medical

## 2019-02-21 ENCOUNTER — Telehealth: Payer: Self-pay | Admitting: Podiatry

## 2019-02-21 ENCOUNTER — Telehealth: Payer: Self-pay | Admitting: Medical

## 2019-02-21 NOTE — Progress Notes (Signed)
   Subjective:    Patient ID: Janet Case, female    DOB: 1960/09/17, 58 y.o.   MRN: [id number]  HPI No show/no charge. Not sure what happened. Called pt 3-4 times and no answer.   Review of Systems     Objective:   Physical Exam        Assessment & Plan:

## 2019-02-21 NOTE — Telephone Encounter (Signed)
Not sure why but for some reason when sent my chart message got message stating delivery failure. So can you call and go over the below.  Janet Case,   It has been 4 months since I last saw you. You can make in office appointment or virtual visit.  Want to discuss potential referral to endocrinologist. May order thyroid US but want to discuss first. Also want to discuss sleeping issue.   Thanks,   Mackie Pai, PA-C

## 2019-02-21 NOTE — Telephone Encounter (Signed)
Pt sent MyChart message, "I am experiencing pain in my foot where the tumor was and I also see a white spot right about where the tumor was so I don't know what is going on please advise."  Called patient and left a voicemail for her to call the office and schedule an appt.

## 2019-03-01 NOTE — Progress Notes (Signed)
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  States is been back now for the past 2 months she concerned about an area of hypopigmentation little more distal than where her current fibroma is.  She states that she gets shooting pains occasionally they come and go and also have discoloration.  Objective: Vital signs are stable she is alert and oriented x3.  Pulses are palpable.  Neurologic sensorium is intact.  Plantar fascia is intact.  She has pain in the palpable nonpulsatile lesion measuring about a centimeter in diameter in the proximalmost aspect of the medial band of the plantar fascia just proximal to an area of hypopigmentation approximately a centimeter in diameter itself.  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  Plan: Discussed etiology pathology conservative or surgical therapies at this point went ahead and reinjected the plantar fibroma once again with 10 mg of Kenalog 5 mg Marcaine for maximal tenderness of that foot.  Tolerated procedure well without complications we will follow-up with me as needed.

## 2019-03-02 ENCOUNTER — Encounter: Payer: Self-pay | Admitting: Podiatry

## 2019-03-02 ENCOUNTER — Ambulatory Visit: Payer: 59

## 2019-03-02 ENCOUNTER — Other Ambulatory Visit: Payer: Self-pay

## 2019-03-02 ENCOUNTER — Ambulatory Visit (INDEPENDENT_AMBULATORY_CARE_PROVIDER_SITE_OTHER): Payer: 59 | Admitting: Podiatry

## 2019-03-02 VITALS — Temp 97.6°F

## 2019-03-02 DIAGNOSIS — M722 Plantar fascial fibromatosis: Secondary | ICD-10-CM

## 2019-04-12 ENCOUNTER — Encounter: Payer: 59 | Admitting: Medical

## 2019-04-16 ENCOUNTER — Other Ambulatory Visit: Payer: Self-pay | Admitting: Medical

## 2019-04-17 NOTE — Telephone Encounter (Addendum)
Refill Request: Clonazepam  Last RX:11/24/18 Last OV:10/13/18 Next OV:04/19/19 UDS:05/31/18 CSC:05/31/18 CSR: 04/18/2019  Refilled rx today.

## 2019-04-19 ENCOUNTER — Telehealth: Payer: Self-pay | Admitting: Medical

## 2019-04-19 ENCOUNTER — Encounter: Payer: Self-pay | Admitting: Medical

## 2019-04-19 ENCOUNTER — Ambulatory Visit (INDEPENDENT_AMBULATORY_CARE_PROVIDER_SITE_OTHER): Payer: 59 | Admitting: Medical

## 2019-04-19 ENCOUNTER — Other Ambulatory Visit: Payer: Self-pay

## 2019-04-19 VITALS — BP 158/100 | HR 98 | Temp 91.0°F | Resp 16 | Ht 64.0 in | Wt 219.4 lb

## 2019-04-19 DIAGNOSIS — I10 Essential (primary) hypertension: Secondary | ICD-10-CM

## 2019-04-19 DIAGNOSIS — Z79899 Other long term (current) drug therapy: Secondary | ICD-10-CM

## 2019-04-19 DIAGNOSIS — Z0000 Encounter for general adult medical examination without abnormal findings: Secondary | ICD-10-CM

## 2019-04-19 DIAGNOSIS — R0683 Snoring: Secondary | ICD-10-CM

## 2019-04-19 DIAGNOSIS — F419 Anxiety disorder, unspecified: Secondary | ICD-10-CM

## 2019-04-19 DIAGNOSIS — K635 Polyp of colon: Secondary | ICD-10-CM | POA: Diagnosis not present

## 2019-04-19 DIAGNOSIS — Z124 Encounter for screening for malignant neoplasm of cervix: Secondary | ICD-10-CM

## 2019-04-19 DIAGNOSIS — E010 Iodine-deficiency related diffuse (endemic) goiter: Secondary | ICD-10-CM | POA: Diagnosis not present

## 2019-04-19 DIAGNOSIS — Z1211 Encounter for screening for malignant neoplasm of colon: Secondary | ICD-10-CM | POA: Diagnosis not present

## 2019-04-19 DIAGNOSIS — G4700 Insomnia, unspecified: Secondary | ICD-10-CM | POA: Diagnosis not present

## 2019-04-19 DIAGNOSIS — Z1159 Encounter for screening for other viral diseases: Secondary | ICD-10-CM

## 2019-04-19 LAB — LIPID PANEL
Cholesterol: 253 mg/dL — ABNORMAL HIGH (ref 0–200)
HDL: 68.4 mg/dL (ref >39.00)
LDL Cholesterol: 169 mg/dL — ABNORMAL HIGH (ref 0–99)
NonHDL: 184.16
Total CHOL/HDL Ratio: 4
Triglycerides: 78 mg/dL (ref 0.0–149.0)
VLDL: 15.6 mg/dL (ref 0.0–40.0)

## 2019-04-19 LAB — CBC WITH DIFFERENTIAL/PLATELET
Basophils Absolute: 0 10*3/uL (ref 0.0–0.1)
Basophils Relative: 0.8 % (ref 0.0–3.0)
Eosinophils Absolute: 0 10*3/uL (ref 0.0–0.7)
Eosinophils Relative: 0.8 % (ref 0.0–5.0)
HCT: 48 % — ABNORMAL HIGH (ref 36.0–46.0)
Hemoglobin: 15.8 g/dL — ABNORMAL HIGH (ref 12.0–15.0)
Lymphocytes Relative: 45.6 % (ref 12.0–46.0)
Lymphs Abs: 2.6 10*3/uL (ref 0.7–4.0)
MCHC: 32.8 g/dL (ref 30.0–36.0)
MCV: 88.9 fl (ref 78.0–100.0)
Monocytes Absolute: 0.5 10*3/uL (ref 0.1–1.0)
Monocytes Relative: 9.2 % (ref 3.0–12.0)
Neutro Abs: 2.4 10*3/uL (ref 1.4–7.7)
Neutrophils Relative %: 43.6 % (ref 43.0–77.0)
Platelets: 246 10*3/uL (ref 150.0–400.0)
RBC: 5.4 Mil/uL — ABNORMAL HIGH (ref 3.87–5.11)
RDW: 15.9 % — ABNORMAL HIGH (ref 11.5–15.5)
WBC: 5.6 10*3/uL (ref 4.0–10.5)

## 2019-04-19 LAB — COMPREHENSIVE METABOLIC PANEL
ALT: 13 U/L (ref 0–35)
AST: 15 U/L (ref 0–37)
Albumin: 4.7 g/dL (ref 3.5–5.2)
Alkaline Phosphatase: 128 U/L — ABNORMAL HIGH (ref 39–117)
BUN: 12 mg/dL (ref 6–23)
CO2: 30 mEq/L (ref 19–32)
Calcium: 10.6 mg/dL — ABNORMAL HIGH (ref 8.4–10.5)
Chloride: 103 mEq/L (ref 96–112)
Creatinine, Ser: 0.95 mg/dL (ref 0.40–1.20)
GFR: 72.53 mL/min (ref >60.00)
Glucose, Bld: 106 mg/dL — ABNORMAL HIGH (ref 70–99)
Potassium: 4.1 mEq/L (ref 3.5–5.1)
Sodium: 142 mEq/L (ref 135–145)
Total Bilirubin: 0.5 mg/dL (ref 0.2–1.2)
Total Protein: 7.5 g/dL (ref 6.0–8.3)

## 2019-04-19 LAB — TSH: TSH: 1.56 u[IU]/mL (ref 0.35–4.50)

## 2019-04-19 MED ORDER — ATORVASTATIN CALCIUM 10 MG PO TABS: 10.0000 mg | ORAL_TABLET | Freq: Every day | ORAL | 3 refills | Status: DC

## 2019-04-19 MED ORDER — CLONAZEPAM 0.5 MG PO TABS: 0.5000 mg | ORAL_TABLET | Freq: Every day | ORAL | 1 refills | Status: DC | PRN

## 2019-04-19 MED ORDER — ZALEPLON 10 MG PO CAPS: ORAL_CAPSULE | ORAL | 1 refills | Status: DC

## 2019-04-19 MED ORDER — VALSARTAN-HYDROCHLOROTHIAZIDE 320-25 MG PO TABS: 1.0000 | ORAL_TABLET | Freq: Every day | ORAL | 5 refills | Status: DC

## 2019-04-19 MED ORDER — TOPIRAMATE 50 MG PO TABS: ORAL_TABLET | ORAL | 2 refills | Status: DC

## 2019-04-19 MED ORDER — AMLODIPINE BESYLATE 10 MG PO TABS: ORAL_TABLET | ORAL | 5 refills | Status: DC

## 2019-04-19 MED ORDER — SERTRALINE HCL 50 MG PO TABS: ORAL_TABLET | ORAL | 3 refills | Status: DC

## 2019-04-19 NOTE — Telephone Encounter (Signed)
Rx atorvastatin sent to pt pharmacy. 

## 2019-04-19 NOTE — Progress Notes (Signed)
Subjective:    Patient ID: Janet Case, female    DOB: Feb 11, 1961, 58 y.o.   MRN: [id number]  HPI  Patient is here for physical. She is  Fasting.  Denies healthy diet and exercise. States she knows she needs to get better. Denies smoking, alcohol, and recreational drugs.  She works in the call center for [employer]. Reports she continues to work from home, but is under a lot of stress and anxiety. Prior to running out of meds, she was using her zoloft and klonopin. She's not sure they are very effective.    Main concern today is not sleeping. She reports she has trouble falling asleep and staying asleep. Estimates that she gets at most 5-6 hours of sleep per night. Reports it has been getting worse over the past 4-5 months. She ran out of her medications last month, but states her zaleplon was helping some. States she thinks sometimes she was taking them too late in the day, making it take too long for them to begin working. States taking Ambien in the past was helping, but she was afraid of getting dependent on it.  Reports she has an enlarged thyroid that has been biopsied in the past, but was just being monitored. She has not seen anyone for it in awhile. No new complaints apart from trouble sleeping.  Blood pressure is high today. She states she ran out of her medicine about a month ago. Needs refills today.  States she has not been checking it at home because her machine broke. No headaches.   Last colonoscopy around 2000. Benign polyps per patient. Willing to have referral placed to get back up to date.   LMP: post menopausal (around age 58). Needs referral for GYN. Pap overdue since 2019, normal per patient.  Flu vaccine reaction per epic review. States nausea. Pt told NP student HA. Pt states later in the day had pounding ha and nausea. Pt states not other reaction.    Review of Systems  Constitutional: Negative for chills, fever and unexpected weight change.  HENT: Negative for  congestion, sneezing and sore throat.        Pt daughter states she snore.  Eyes: Negative for visual disturbance.  Respiratory: Negative for chest tightness and shortness of breath.   Gastrointestinal: Negative for abdominal pain, blood in stool, constipation, diarrhea and nausea.  Endocrine: Negative for polydipsia, polyphagia and polyuria.  Genitourinary: Negative for dysuria, frequency, hematuria and urgency.  Musculoskeletal: Negative for back pain, joint swelling, myalgias and neck pain.       Left shoulder bursitis, aches, spasms- followed by Sports Medicine. Pain today is 5/10 with activity.  Skin: Negative for rash.  Neurological: Negative for dizziness, syncope, weakness, light-headedness, numbness and headaches.  Psychiatric/Behavioral: Positive for sleep disturbance. Negative for confusion, hallucinations, self-injury and suicidal ideas. The patient is nervous/anxious.     Past Medical History:  Diagnosis Date  . Anxiety   . Clonic hemifacial spasm    Left Facial  . Colon polyps   . Depression   . Hypertension   . Muscle spasms of head and/or neck      Social History   Socioeconomic History  . Marital status: Divorced    Spouse name: Not on file  . Number of children: 2  . Years of education: Not on file  . Highest education level: Not on file  Occupational History  . Occupation: Occupational psychologistCustomer Service Representative    Comment: [employer]  Social Needs  .  Financial resource strain: Not on file  . Food insecurity    Worry: Not on file    Inability: Not on file  . Transportation needs    Medical: Not on file    Non-medical: Not on file  Tobacco Use  . Smoking status: Never Smoker  . Smokeless tobacco: Never Used  Substance and Sexual Activity  . Alcohol use: No    Alcohol/week: 0.0 standard drinks  . Drug use: No  . Sexual activity: Never  Lifestyle  . Physical activity    Days per week: Not on file    Minutes per session: Not on file  . Stress: Not on file   Relationships  . Social Musician on phone: Not on file    Gets together: Not on file    Attends religious service: Not on file    Active member of club or organization: Not on file    Attends meetings of clubs or organizations: Not on file    Relationship status: Not on file  . Intimate partner violence    Fear of current or ex partner: Not on file    Emotionally abused: Not on file    Physically abused: Not on file    Forced sexual activity: Not on file  Other Topics Concern  . Not on file  Social History Narrative  . Not on file    Past Surgical History:  Procedure Laterality Date  . POLYPECTOMY     Oral  . TUBAL LIGATION    . VAGINAL WOUND CLOSURE / REPAIR  1977  . WISDOM TOOTH EXTRACTION      Family History  Problem Relation Age of Onset  . Hyperlipidemia Mother 41       Deceased  . Heart disease Mother   . Congestive Heart Failure Mother   . Hypertension Mother   . Kidney disease Mother   . Diabetes Mother   . Dementia Mother   . Heart defect Mother   . Kidney disease Father 88       Deceased  . Heart attack Father   . Hypertension Sister   . Diabetes Sister   . Breast cancer Sister   . Depression Sister   . Anxiety disorder Sister   . Thyroid cancer Sister        Thyroid Removed  . Leukemia Maternal Aunt   . Allergies Daughter   . GI problems Daughter     Allergies  Allergen Reactions  . Influenza Vaccines Nausea Only    Current Outpatient Medications on File Prior to Visit  Medication Sig Dispense Refill  . amLODipine (NORVASC) 10 MG tablet TAKE 1 TABLET (10 MG TOTAL) BY MOUTH DAILY. 30 tablet 5  . clonazePAM (KLONOPIN) 0.5 MG tablet TAKE 1 TABLET BY MOUTH DAILY AS NEEDED FOR ANXIETY. 30 tablet 1  . meloxicam (MOBIC) 7.5 MG tablet TAKE 1 TO 2 TABLETS(7.5 TO 15 MG) BY MOUTH DAILY 30 tablet 3  . sertraline (ZOLOFT) 50 MG tablet TAKE 1 TABLET(50 MG) BY MOUTH DAILY 30 tablet 3  . topiramate (TOPAMAX) 50 MG tablet TAKE 1 TABLET(50 MG) BY  MOUTH TWICE DAILY 60 tablet 2  . valsartan-hydrochlorothiazide (DIOVAN-HCT) 320-25 MG tablet Take 1 tablet by mouth daily. 30 tablet 3  . zaleplon (SONATA) 10 MG capsule TAKE 1 CAPSULE BY MOUTH AT BEDTIME AS NEEDED FOR SLEEP 30 capsule 0   No current facility-administered medications on file prior to visit.     BP (!) 167/117  Pulse 98   Temp (!) 91 F (32.8 C) (Temporal)   Resp 16   Ht 5\' 4"  (1.626 m)   Wt 219 lb 6.4 oz (99.5 kg)   SpO2 98%   BMI 37.66 kg/m     Objective:   Physical Exam  General Mental Status- Alert. General Appearance- Not in acute distress.   Skin General: Color- Normal Color. Moisture- Normal Moisture.  Neck Carotid Arteries- Normal color. Moisture- Normal Moisture. No carotid bruits. No JVD.  Chest and Lung Exam Auscultation: Breath Sounds:-Normal.  Cardiovascular Auscultation:Rythm- Regular. Murmurs & Other Heart Sounds:Auscultation of the heart reveals- No Murmurs.  Abdomen Inspection:-Inspeection Normal. Palpation/Percussion:Note:No mass. Palpation and Percussion of the abdomen reveal- Non Tender, Non Distended + BS, no rebound or guarding.   Neurologic Cranial Nerve exam:- CN III-XII intact(No nystagmus), symmetric smile. Drift Test:- No drift. Romberg Exam:- Negative.  Heal to Toe Gait exam:-Normal. Finger to Nose:- Normal/Intact Strength:- 5/5 equal and symmetric strength both upper and lower extremities.      Assessment & Plan:  For you wellness exam today I have ordered cbc, cmp, tsh, and lipid panel.  Flu vaccine declined since on allergy list. Counseled if viral syndrome symptoms be evaluated early.  Recommend exercise and healthy diet.  We will let you know lab results as they come in.  Follow up date appointment will be determined after lab review.   Anxiety, advised to get back on sertraline daily and will refill her clonazepam.  Patient is going to sign controlled medication contract and give UDS.  Reviewed  controlled medication site today.  For insomnia, I refilled her Sonata.  Also she does have some recent snoring so went ahead and refer to pulmonologist to evaluate her insomnia as well as for possible sleep apnea.  Patient has hypertension and BP is elevated today.  However not on medications presently.  So refill of her BP medications.  History of thyromegaly and will get TSH today.  Referral to GI MD placed for screening colonoscopy.  Referral to gynecologist as well for cervical cancer screening.  Follow-up date to be determined after lab review.  [id number] chart today as well as physical exam.  See above chronic problems that were addressed today.  Nurse practitioner student interviewed patient initially.  Then I evaluated patient and modified note accordingly.  Treatment decisions made by myself.

## 2019-04-19 NOTE — Patient Instructions (Addendum)
For you wellness exam today I have ordered cbc, cmp, tsh, and lipid panel.  Flu vaccine declined since on allergy list. Counseled if viral syndrome symptoms be evaluated early.  Recommend exercise and healthy diet.  We will let you know lab results as they come in.  Follow up date appointment will be determined after lab review.   Anxiety, advised to get back on sertraline daily and will refill her clonazepam.  Patient is going to sign controlled medication contract and give UDS.  Reviewed controlled medication site today.  For insomnia, I refilled her Sonata.  Also she does have some recent snoring so went ahead and refer to pulmonologist to evaluate her insomnia as well as for possible sleep apnea.  Patient has hypertension and BP is elevated today.  However not on medications presently.  So refill of her BP medications.  History of thyromegaly and will get TSH today.  Referral to GI MD placed for screening colonoscopy.  Referral to gynecologist as well for cervical cancer screening.  Follow-up date to be determined after lab review.  Preventive Care 38-77 Years Old, Female Preventive care refers to visits with your health care provider and lifestyle choices that can promote health and wellness. This includes:  A yearly physical exam. This may also be called an annual well check.  Regular dental visits and eye exams.  Immunizations.  Screening for certain conditions.  Healthy lifestyle choices, such as eating a healthy diet, getting regular exercise, not using drugs or products that contain nicotine and tobacco, and limiting alcohol use. What can I expect for my preventive care visit? Physical exam Your health care provider will check your:  Height and weight. This may be used to calculate body mass index (BMI), which tells if you are at a healthy weight.  Heart rate and blood pressure.  Skin for abnormal spots. Counseling Your health care provider may ask you  questions about your:  Alcohol, tobacco, and drug use.  Emotional well-being.  Home and relationship well-being.  Sexual activity.  Eating habits.  Work and work Statistician.  Method of birth control.  Menstrual cycle.  Pregnancy history. What immunizations do I need?  Influenza (flu) vaccine  This is recommended every year. Tetanus, diphtheria, and pertussis (Tdap) vaccine  You may need a Td booster every 10 years. Varicella (chickenpox) vaccine  You may need this if you have not been vaccinated. Zoster (shingles) vaccine  You may need this after age 73. Measles, mumps, and rubella (MMR) vaccine  You may need at least one dose of MMR if you were born in 1957 or later. You may also need a second dose. Pneumococcal conjugate (PCV13) vaccine  You may need this if you have certain conditions and were not previously vaccinated. Pneumococcal polysaccharide (PPSV23) vaccine  You may need one or two doses if you smoke cigarettes or if you have certain conditions. Meningococcal conjugate (MenACWY) vaccine  You may need this if you have certain conditions. Hepatitis A vaccine  You may need this if you have certain conditions or if you travel or work in places where you may be exposed to hepatitis A. Hepatitis B vaccine  You may need this if you have certain conditions or if you travel or work in places where you may be exposed to hepatitis B. Haemophilus influenzae type b (Hib) vaccine  You may need this if you have certain conditions. Human papillomavirus (HPV) vaccine  If recommended by your health care provider, you may need three doses  over 6 months. You may receive vaccines as individual doses or as more than one vaccine together in one shot (combination vaccines). Talk with your health care provider about the risks and benefits of combination vaccines. What tests do I need? Blood tests  Lipid and cholesterol levels. These may be checked every 5 years, or more  frequently if you are over 44 years old.  Hepatitis C test.  Hepatitis B test. Screening  Lung cancer screening. You may have this screening every year starting at age 52 if you have a 30-pack-year history of smoking and currently smoke or have quit within the past 15 years.  Colorectal cancer screening. All adults should have this screening starting at age 36 and continuing until age 20. Your health care provider may recommend screening at age 80 if you are at increased risk. You will have tests every 1-10 years, depending on your results and the type of screening test.  Diabetes screening. This is done by checking your blood sugar (glucose) after you have not eaten for a while (fasting). You may have this done every 1-3 years.  Mammogram. This may be done every 1-2 years. Talk with your health care provider about when you should start having regular mammograms. This may depend on whether you have a family history of breast cancer.  BRCA-related cancer screening. This may be done if you have a family history of breast, ovarian, tubal, or peritoneal cancers.  Pelvic exam and Pap test. This may be done every 3 years starting at age 4. Starting at age 89, this may be done every 5 years if you have a Pap test in combination with an HPV test. Other tests  Sexually transmitted disease (STD) testing.  Bone density scan. This is done to screen for osteoporosis. You may have this scan if you are at high risk for osteoporosis. Follow these instructions at home: Eating and drinking  Eat a diet that includes fresh fruits and vegetables, whole grains, lean protein, and low-fat dairy.  Take vitamin and mineral supplements as recommended by your health care provider.  Do not drink alcohol if: ? Your health care provider tells you not to drink. ? You are pregnant, may be pregnant, or are planning to become pregnant.  If you drink alcohol: ? Limit how much you have to 0-1 drink a day. ? Be aware  of how much alcohol is in your drink. In the [place] one drink equals one 12 oz bottle of beer (355 mL), one 5 oz glass of wine (148 mL), or one 1 oz glass of hard liquor (44 mL). Lifestyle  Take daily care of your teeth and gums.  Stay active. Exercise for at least 30 minutes on 5 or more days each week.  Do not use any products that contain nicotine or tobacco, such as cigarettes, e-cigarettes, and chewing tobacco. If you need help quitting, ask your health care provider.  If you are sexually active, practice safe sex. Use a condom or other form of birth control (contraception) in order to prevent pregnancy and STIs (sexually transmitted infections).  If told by your health care provider, take low-dose aspirin daily starting at age 32. What's next?  Visit your health care provider once a year for a well check visit.  Ask your health care provider how often you should have your eyes and teeth checked.  Stay up to date on all vaccines. This information is not intended to replace advice given to you by your health care  provider. Make sure you discuss any questions you have with your health care provider. Document Released: 07/26/2015 Document Revised: 03/10/2018 Document Reviewed: 03/10/2018 Elsevier Patient Education  2020 [employer].

## 2019-04-20 ENCOUNTER — Other Ambulatory Visit: Payer: 59

## 2019-04-20 ENCOUNTER — Other Ambulatory Visit (INDEPENDENT_AMBULATORY_CARE_PROVIDER_SITE_OTHER): Payer: 59

## 2019-04-20 DIAGNOSIS — Z1159 Encounter for screening for other viral diseases: Secondary | ICD-10-CM

## 2019-04-20 DIAGNOSIS — R739 Hyperglycemia, unspecified: Secondary | ICD-10-CM

## 2019-04-20 LAB — PAIN MGMT, PROFILE 8 W/CONF, U
6 Acetylmorphine: NEGATIVE ng/mL
Alcohol Metabolites: NEGATIVE ng/mL (ref <500)
Amphetamines: NEGATIVE ng/mL
Benzodiazepines: NEGATIVE ng/mL
Buprenorphine, Urine: NEGATIVE ng/mL
Cocaine Metabolite: NEGATIVE ng/mL
Creatinine: 111.6 mg/dL
MDMA: NEGATIVE ng/mL
Marijuana Metabolite: NEGATIVE ng/mL
Opiates: NEGATIVE ng/mL
Oxidant: NEGATIVE ug/mL
Oxycodone: NEGATIVE ng/mL
pH: 5.4 (ref 4.5–9.0)

## 2019-04-20 LAB — HEMOGLOBIN A1C: Hgb A1c MFr Bld: 6 % (ref 4.6–6.5)

## 2019-04-20 NOTE — Telephone Encounter (Signed)
Future order entered for [facility] and request faxed to [facility] to release order and send to [facility] for additional testing if enough sample available for testing.

## 2019-04-21 LAB — HEPATITIS C ANTIBODY
Hepatitis C Ab: NONREACTIVE
SIGNAL TO CUT-OFF: 0.02 (ref <1.00)

## 2019-04-25 ENCOUNTER — Encounter: Payer: Self-pay | Admitting: Gastroenterology

## 2019-05-25 ENCOUNTER — Encounter: Payer: 59 | Admitting: Gastroenterology

## 2019-06-12 ENCOUNTER — Encounter: Payer: 59 | Admitting: Gastroenterology

## 2019-06-23 ENCOUNTER — Encounter: Payer: Self-pay | Admitting: Medical

## 2019-06-25 ENCOUNTER — Other Ambulatory Visit: Payer: Self-pay | Admitting: Medical

## 2019-06-28 ENCOUNTER — Other Ambulatory Visit: Payer: Self-pay | Admitting: *Deleted

## 2019-06-28 ENCOUNTER — Encounter: Payer: Self-pay | Admitting: Medical

## 2019-06-28 ENCOUNTER — Encounter: Payer: Self-pay | Admitting: *Deleted

## 2019-06-28 ENCOUNTER — Telehealth: Payer: Self-pay | Admitting: Medical

## 2019-06-28 MED ORDER — MELOXICAM 7.5 MG PO TABS: ORAL_TABLET | ORAL | 0 refills | Status: DC

## 2019-06-28 NOTE — Telephone Encounter (Signed)
[facility] [place]  Phone:  [phone]   Fax:  [phone]  Pt referred to GI MD for evaluation/colonoscopy. They sent me letter stating unable to reach her. Will you call and give her number to call them back.

## 2019-06-28 NOTE — Telephone Encounter (Signed)
Mychart message sent to patient.

## 2019-07-22 ENCOUNTER — Other Ambulatory Visit: Payer: Self-pay | Admitting: Family Medicine

## 2019-07-27 ENCOUNTER — Encounter: Payer: Self-pay | Admitting: Medical

## 2019-07-30 ENCOUNTER — Other Ambulatory Visit: Payer: Self-pay | Admitting: Medical

## 2019-07-31 NOTE — Telephone Encounter (Signed)
Please advise 

## 2019-08-01 NOTE — Telephone Encounter (Signed)
Refill benzonatate sent to pt pharmacy.

## 2019-08-03 ENCOUNTER — Telehealth: Payer: Self-pay

## 2019-08-03 NOTE — Telephone Encounter (Signed)
Spoke to patient and advised of needing vitals

## 2019-08-03 NOTE — Telephone Encounter (Signed)
Spoke with patient and scheduled. Patient wanted to schedule a virtual visit due to having to [place] to office. Advised of in office but she stated she reach out to you and see if the virtual was ok to do or does she absolute need to come in.

## 2019-08-03 NOTE — Telephone Encounter (Signed)
Virtual ok. Just want her to have vitals.

## 2019-08-09 ENCOUNTER — Encounter: Payer: Self-pay | Admitting: Medical

## 2019-08-09 ENCOUNTER — Ambulatory Visit (INDEPENDENT_AMBULATORY_CARE_PROVIDER_SITE_OTHER): Payer: 59 | Admitting: Medical

## 2019-08-09 ENCOUNTER — Other Ambulatory Visit: Payer: Self-pay

## 2019-08-09 VITALS — BP 150/80 | Temp 98.0°F | Ht 64.0 in | Wt 219.0 lb

## 2019-08-09 DIAGNOSIS — F419 Anxiety disorder, unspecified: Secondary | ICD-10-CM | POA: Diagnosis not present

## 2019-08-09 DIAGNOSIS — I10 Essential (primary) hypertension: Secondary | ICD-10-CM

## 2019-08-09 DIAGNOSIS — M25512 Pain in left shoulder: Secondary | ICD-10-CM

## 2019-08-09 DIAGNOSIS — F32A Depression, unspecified: Secondary | ICD-10-CM

## 2019-08-09 DIAGNOSIS — F329 Major depressive disorder, single episode, unspecified: Secondary | ICD-10-CM

## 2019-08-09 DIAGNOSIS — G8929 Other chronic pain: Secondary | ICD-10-CM

## 2019-08-09 MED ORDER — MELOXICAM 7.5 MG PO TABS: ORAL_TABLET | ORAL | 1 refills | Status: DC

## 2019-08-09 NOTE — Progress Notes (Signed)
Subjective:    Patient ID: Janet Case, female    DOB: Jul 19, 1960, 59 y.o.   MRN: [id number]  HPI  Virtual Visit via Video Note  I connected with Janet Case on 08/09/19 at 11:00 AM EST by a video enabled telemedicine application and verified that I am speaking with the correct person using two identifiers.  Location: Patient: home Provider: office   I discussed the limitations of evaluation and management by telemedicine and the availability of in person appointments. The patient expressed understanding and agreed to proceed.  History of Present Illness:   Pt has history of anxiety and stress. She states recent job change and training has been very stressful.   Pt in past had fmla form filled out related to work absenses from anxiety. She state needs this renewed.  Pt states she might be working 7 days straight. She tells me in past her work told her to put down 3 times per week up to 3 days.(this affords her flexibility to be out up to 1-3 days.)  Pt recently numerous consecutive days in a row due to stress/anxiety. January 8 th, January 15 th, January 18 th, January 21st, January 22nd ,  January 25 th and January 27 th.  She states had forgotten to use sertraline. She was using every other. Pt is on  clonazepam twice daily. Pt states on those days felt close to panic attacks.   Pt is feeling confidant that after she finished training   Pt needs refill of clonazepam.   Pt has htn.. She last took med yesterday morning.    Observations/Objective: General-no acute distress, pleasant, oriented. Lungs- on inspection lungs appear unlabored. Neck- no tracheal deviation or jvd on inspection. Neuro- gross motor function appears intact.  Assessment and Plan: Patient has had some increase of anxiety intermittently with her new job.  Increased presently per patient report during the training.  Her mood/depression on review appears stable.  I think the increase of anxiety  combination of new job as well as the fact that she stopped taking sertraline consistently.  But she is still taking clonazepam.  Advised her start taking sertraline daily basis.  We will refill her clonazepam.  If she is still anxious on daily sertraline and passed her new job training.  Then will refer her to psychiatrist.  Blood pressure is little bit elevated today.  Last time she took blood pressure medication was more than 24 hours ago.  As stated take BP meds daily and start checking blood pressure 3 times a week.  On follow-up if BP is still elevated then will need to make changes to current regimen.  Patient also has some shoulder pain for which she has seen sports medicine in the past.  She uses meloxicam intermittently.  I did refill her meloxicam today but educated her on just taking it sparingly as it can affect her blood pressure and might cause GI irritation.  Patient expressed understanding.  Follow-up in 2 weeks or as needed.   30 minutes spent with patient today.  50% of time spent counseling patient on plan going forward.  In addition discussed on how I will fill out her FMLA form. Follow Up Instructions:    I discussed the assessment and treatment plan with the patient. The patient was provided an opportunity to ask questions and all were answered. The patient agreed with the plan and demonstrated an understanding of the instructions.   The patient was advised to call back  or seek an in-person evaluation if the symptoms worsen or if the condition fails to improve as anticipated.  I provided 30 minutes of non-face-to-face time during this encounter.   Mackie Pai, PA-C   Review of Systems     Objective:   Physical Exam        Assessment & Plan:

## 2019-08-09 NOTE — Patient Instructions (Signed)
Patient has had some increase of anxiety intermittently with her new job.  Increased presently per patient report during the training.  Her mood/depression on review appears stable.  I think the increase of anxiety combination of new job as well as the fact that she stopped taking sertraline consistently.  But she is still taking clonazepam.  Advised her start taking sertraline daily basis.  We will refill her clonazepam.  If she is still anxious on daily sertraline and passed her new job training.  Then will refer her to psychiatrist.  Blood pressure is little bit elevated today.  Last time she took blood pressure medication was more than 24 hours ago.  As stated take BP meds daily and start checking blood pressure 3 times a week.  On follow-up if BP is still elevated then will need to make changes to current regimen.  Patient also has some shoulder pain for which she has seen sports medicine in the past.  She uses meloxicam intermittently.  I did refill her meloxicam today but educated her on just taking it sparingly as it can affect her blood pressure and might cause GI irritation.  Patient expressed understanding.  Follow-up in 2 weeks or as needed.

## 2019-08-14 ENCOUNTER — Telehealth: Payer: Self-pay | Admitting: Medical

## 2019-08-14 NOTE — Telephone Encounter (Signed)
Most recent fmla paper work done. Please fax or have her pick it up.

## 2019-08-14 NOTE — Telephone Encounter (Signed)
Faxed

## 2019-08-16 ENCOUNTER — Encounter: Payer: Self-pay | Admitting: Medical

## 2019-08-22 ENCOUNTER — Telehealth: Payer: Self-pay | Admitting: Medical

## 2019-08-22 NOTE — Telephone Encounter (Signed)
Filled out form for patient. I have already filled out forms fmla type related to her anxiety.  Now have medical accomadation form. Filled out according to her complaint of increased urination secondary to diuretic use. Let pt know this is how I filled form out. Assumed condition was referring to this recent complaint of hers. Confirm this is way filled out form and fax over.

## 2019-08-22 NOTE — Telephone Encounter (Signed)
Sent mychart message papers was faxed.

## 2019-09-06 ENCOUNTER — Other Ambulatory Visit: Payer: Self-pay | Admitting: Medical

## 2019-10-05 ENCOUNTER — Encounter: Payer: Self-pay | Admitting: Medical

## 2019-10-09 ENCOUNTER — Other Ambulatory Visit: Payer: Self-pay | Admitting: Medical

## 2019-10-18 ENCOUNTER — Other Ambulatory Visit: Payer: Self-pay | Admitting: Medical

## 2019-10-19 ENCOUNTER — Encounter: Payer: Self-pay | Admitting: Medical

## 2019-10-19 NOTE — Telephone Encounter (Signed)
Requesting:Sonata Contract:yes UDS:low risk next screen 10/18/19 Last OV:08/09/19 Next OV:n/a Last Refill:04/18/20  #[id number] Database:   Please advise

## 2019-10-20 ENCOUNTER — Other Ambulatory Visit: Payer: Self-pay

## 2019-10-23 ENCOUNTER — Ambulatory Visit (INDEPENDENT_AMBULATORY_CARE_PROVIDER_SITE_OTHER): Payer: 59 | Admitting: Medical

## 2019-10-23 ENCOUNTER — Other Ambulatory Visit: Payer: Self-pay

## 2019-10-23 ENCOUNTER — Ambulatory Visit: Payer: 59 | Admitting: Medical

## 2019-10-23 ENCOUNTER — Encounter: Payer: Self-pay | Admitting: Medical

## 2019-10-23 VITALS — BP 133/87 | HR 99 | Temp 97.4°F | Resp 18 | Ht 64.0 in | Wt 219.6 lb

## 2019-10-23 DIAGNOSIS — F329 Major depressive disorder, single episode, unspecified: Secondary | ICD-10-CM

## 2019-10-23 DIAGNOSIS — F419 Anxiety disorder, unspecified: Secondary | ICD-10-CM

## 2019-10-23 DIAGNOSIS — F32A Depression, unspecified: Secondary | ICD-10-CM

## 2019-10-23 NOTE — Progress Notes (Signed)
Subjective:    Patient ID: Janet Case, female    DOB: [birth year], 59 y.o.   MRN: [id number]  HPI  Pt in for follow for insomnia and anxiety. Refilled her sonata today for insoonia. Pt uses clonazepam for anxiety.   Pt was recently out of for more than 5 consecutive days. She states they consider this short term disability. She was monitored on calls and she failed some phone calls. So this is stressed being monitored. Then had some computer issued that prevented her from working. Situation at work causes anxiety and then effects mood/decreases mood.   Days that she missed recently consecutive  March 18- March 26. But she was not working on weekend.   Note filled out section 8 per her request as we had decided in past after discussions.   Review of Systems  Constitutional: Negative for chills, fatigue and fever.  Cardiovascular: Negative for chest pain and palpitations.  Gastrointestinal: Negative for abdominal pain.  Musculoskeletal: Negative for back pain.  Neurological: Negative for dizziness.  Psychiatric/Behavioral: Positive for sleep disturbance. Negative for behavioral problems, decreased concentration and suicidal ideas. The patient is nervous/anxious.     Past Medical History:  Diagnosis Date  . Anxiety   . Clonic hemifacial spasm    Left Facial  . Colon polyps   . Depression   . Hypertension   . Muscle spasms of head and/or neck      Social History   Socioeconomic History  . Marital status: Divorced    Spouse name: Not on file  . Number of children: 2  . Years of education: Not on file  . Highest education level: Not on file  Occupational History  . Occupation: Radiation protection practitioner    Comment: [employer]  Tobacco Use  . Smoking status: Never Smoker  . Smokeless tobacco: Never Used  Substance and Sexual Activity  . Alcohol use: No    Alcohol/week: 0.0 standard drinks  . Drug use: No  . Sexual activity: Never  Other Topics Concern  . Not on  file  Social History Narrative  . Not on file   Social Determinants of Health   Financial Resource Strain:   . Difficulty of Paying Living Expenses:   Food Insecurity:   . Worried About Charity fundraiser in the Last Year:   . Arboriculturist in the Last Year:   Transportation Needs:   . Film/video editor (Medical):   Marland Kitchen Lack of Transportation (Non-Medical):   Physical Activity:   . Days of Exercise per Week:   . Minutes of Exercise per Session:   Stress:   . Feeling of Stress :   Social Connections:   . Frequency of Communication with Friends and Family:   . Frequency of Social Gatherings with Friends and Family:   . Attends Religious Services:   . Active Member of Clubs or Organizations:   . Attends Archivist Meetings:   Marland Kitchen Marital Status:   Intimate Partner Violence:   . Fear of Current or Ex-Partner:   . Emotionally Abused:   Marland Kitchen Physically Abused:   . Sexually Abused:     Past Surgical History:  Procedure Laterality Date  . POLYPECTOMY     Oral  . TUBAL LIGATION    . VAGINAL WOUND CLOSURE / [place]  . WISDOM TOOTH EXTRACTION      Family History  Problem Relation Age of Onset  . Hyperlipidemia Mother 1  Deceased  . Heart disease Mother   . Congestive Heart Failure Mother   . Hypertension Mother   . Kidney disease Mother   . Diabetes Mother   . Dementia Mother   . Heart defect Mother   . Kidney disease Father 75       Deceased  . Heart attack Father   . Hypertension Sister   . Diabetes Sister   . Breast cancer Sister   . Depression Sister   . Anxiety disorder Sister   . Thyroid cancer Sister        Thyroid Removed  . Leukemia Maternal Aunt   . Allergies Daughter   . GI problems Daughter     Allergies  Allergen Reactions  . Influenza Vaccines Nausea Only    Current Outpatient Medications on File Prior to Visit  Medication Sig Dispense Refill  . amLODipine (NORVASC) 10 MG tablet TAKE 1 TABLET (10 MG TOTAL) BY MOUTH  DAILY. 30 tablet 5  . atorvastatin (LIPITOR) 10 MG tablet Take 1 tablet (10 mg total) by mouth daily. 30 tablet 3  . clonazePAM (KLONOPIN) 0.5 MG tablet Take 1 tablet (0.5 mg total) by mouth daily as needed. for anxiety 30 tablet 1  . meloxicam (MOBIC) 7.5 MG tablet TAKE 1 TABLET BY MOUTH DAILY 30 tablet 1  . sertraline (ZOLOFT) 50 MG tablet TAKE 1 TABLET(50 MG) BY MOUTH DAILY 30 tablet 3  . topiramate (TOPAMAX) 50 MG tablet TAKE 1 TABLET(50 MG) BY MOUTH TWICE DAILY 60 tablet 2  . valsartan-hydrochlorothiazide (DIOVAN-HCT) 320-25 MG tablet Take 1 tablet by mouth daily. 30 tablet 5  . zaleplon (SONATA) 10 MG capsule TAKE 1 CAPSULE BY MOUTH AT BEDTIME AS NEEDED FOR SLEEP 30 capsule 3   No current facility-administered medications on file prior to visit.    BP 133/87 (BP Location: Left Arm, Patient Position: Sitting, Cuff Size: Large)   Pulse 99   Temp (!) 97.4 F (36.3 C) (Temporal)   Resp 18   Ht 5\' 4"  (1.626 m)   Wt 219 lb 9.6 oz (99.6 kg)   SpO2 99%   BMI 37.69 kg/m       Objective:   Physical Exam  General- No acute distress. Pleasant patient. Neck- Full range of motion, no jvd Lungs- Clear, even and unlabored. Heart- regular rate and rhythm. Neurologic- CNII- XII grossly intact.      Assessment & Plan:  For anxiety, depression and insomnia continue current regimen.   I did fill out your fmla forms again. Asked them to excuse you for 5 consecutive days.   If you time off is consistently exceeding fmla forms let me know referral to psychiatrist or counselor is option.  Follow up in 3 months or as needed   Time spent with patient today was 25 minutes which consisted of chart review, discussing diagnosis, work up treatment and documentation(and filled out paperwork)   , PA-C

## 2019-10-23 NOTE — Patient Instructions (Signed)
For anxiety, depression and insomnia continue current regimen.   I did fill out your fmla forms again. Asked them to excuse you for 5 consecutive days.   If you time off is consistently exceeding fmla forms let me know referral to psychiatrist or counselor is option.  Follow up in 3 months or as needed

## 2019-10-26 ENCOUNTER — Other Ambulatory Visit: Payer: Self-pay | Admitting: Medical

## 2019-10-26 NOTE — Telephone Encounter (Addendum)
rx clonazepam refilled.  Not sure if she is up to date on contract. She needs updated for both above and sonata.

## 2019-10-30 ENCOUNTER — Ambulatory Visit: Payer: 59 | Admitting: Medical

## 2019-11-01 ENCOUNTER — Other Ambulatory Visit: Payer: Self-pay | Admitting: Medical

## 2019-12-09 ENCOUNTER — Other Ambulatory Visit: Payer: Self-pay | Admitting: Medical

## 2019-12-12 NOTE — Telephone Encounter (Signed)
Requesting: clonzepam Contract:05/03/2019 UDS:04/19/2019 Last Visit:10/23/19 Next Visit:n/a Last Refill:10/26/19  Please Advise

## 2019-12-13 NOTE — Telephone Encounter (Signed)
Want her to follow up for in office visit in august. Controlled med visit. Sent in clonazepam refill today.

## 2020-01-12 ENCOUNTER — Encounter: Payer: Self-pay | Admitting: Medical

## 2020-01-14 ENCOUNTER — Other Ambulatory Visit: Payer: Self-pay | Admitting: Medical

## 2020-02-19 ENCOUNTER — Other Ambulatory Visit: Payer: Self-pay | Admitting: Medical

## 2020-02-19 NOTE — Telephone Encounter (Signed)
Rx meloxicam sent to pt pharmacy. Pt needs to have follow up in next month or two.   She needs controlled med visit. She may be asking for refill of some controlled meds soon so go ahead and get scheduled.

## 2020-02-19 NOTE — Telephone Encounter (Signed)
Can this patient receive a refill on this medication? Last office visit- 10-23-2019

## 2020-02-19 NOTE — Telephone Encounter (Signed)
Pt called and lvm to return call 

## 2020-03-26 ENCOUNTER — Encounter: Payer: Self-pay | Admitting: Medical

## 2020-03-27 ENCOUNTER — Ambulatory Visit: Payer: 59 | Admitting: Medical

## 2020-04-01 ENCOUNTER — Other Ambulatory Visit: Payer: Self-pay | Admitting: Medical

## 2020-04-01 ENCOUNTER — Encounter: Payer: Self-pay | Admitting: Medical

## 2020-04-02 NOTE — Telephone Encounter (Signed)
Patient sent message with refill asking for dose to be increased.

## 2020-04-03 ENCOUNTER — Telehealth: Payer: Self-pay | Admitting: Medical

## 2020-04-03 MED ORDER — TOPIRAMATE 50 MG PO TABS: 50.0000 mg | ORAL_TABLET | Freq: Two times a day (BID) | ORAL | 0 refills | Status: DC

## 2020-04-03 MED ORDER — MELOXICAM 7.5 MG PO TABS: 7.5000 mg | ORAL_TABLET | Freq: Every day | ORAL | 0 refills | Status: DC

## 2020-04-03 NOTE — Telephone Encounter (Signed)
She needs to have office visit to increase dose. Gave current dose supply for 2 weeks. She needs to get sheduled.

## 2020-04-03 NOTE — Telephone Encounter (Signed)
I gave her 2 week supply. For dose increase need to have office visit to discuss what is going on/etc.

## 2020-04-04 NOTE — Telephone Encounter (Signed)
Patient states she is unable to come into office right now due to work so she will call back for an appointment in Oct.

## 2020-04-27 ENCOUNTER — Other Ambulatory Visit: Payer: Self-pay | Admitting: Medical

## 2020-05-10 ENCOUNTER — Other Ambulatory Visit: Payer: Self-pay | Admitting: Medical

## 2020-05-13 NOTE — Telephone Encounter (Signed)
Requesting: klonopin  Contract:04/2019 UDS:04/2019 Last Visit:10/23/19 Next Visit:n/a Last Refill:12/13/19  Please Advise

## 2020-05-14 MED ORDER — ZALEPLON 10 MG PO CAPS: ORAL_CAPSULE | ORAL | 0 refills | Status: DC

## 2020-05-14 MED ORDER — CLONAZEPAM 0.5 MG PO TABS: 0.5000 mg | ORAL_TABLET | Freq: Every day | ORAL | 0 refills | Status: DC | PRN

## 2020-05-14 MED ORDER — TOPIRAMATE 50 MG PO TABS: 50.0000 mg | ORAL_TABLET | Freq: Two times a day (BID) | ORAL | 0 refills | Status: DC

## 2020-05-14 NOTE — Telephone Encounter (Signed)
Pt is overdue for controlled med visit. Gave 10 days supply of anxiety and sleep med. She needs follow up wiithin next 10 days.

## 2020-05-15 ENCOUNTER — Encounter: Payer: Self-pay | Admitting: Medical

## 2020-05-15 NOTE — Telephone Encounter (Signed)
Called pt and lvm to return call to schedule an appt 

## 2020-06-13 ENCOUNTER — Encounter: Payer: Self-pay | Admitting: Medical

## 2020-06-18 ENCOUNTER — Encounter: Payer: Self-pay | Admitting: Medical

## 2020-06-21 MED ORDER — TOPIRAMATE 50 MG PO TABS: 50.0000 mg | ORAL_TABLET | Freq: Two times a day (BID) | ORAL | 0 refills | Status: DC

## 2020-06-21 MED ORDER — MELOXICAM 7.5 MG PO TABS: 7.5000 mg | ORAL_TABLET | Freq: Every day | ORAL | 0 refills | Status: DC

## 2020-06-21 MED ORDER — ATORVASTATIN CALCIUM 10 MG PO TABS: 10.0000 mg | ORAL_TABLET | Freq: Every day | ORAL | 3 refills | Status: DC

## 2020-06-21 MED ORDER — AMLODIPINE BESYLATE 10 MG PO TABS: ORAL_TABLET | ORAL | 5 refills | Status: DC

## 2020-06-21 MED ORDER — SERTRALINE HCL 50 MG PO TABS: ORAL_TABLET | ORAL | 3 refills | Status: DC

## 2020-06-21 MED ORDER — ZALEPLON 10 MG PO CAPS: ORAL_CAPSULE | ORAL | 0 refills | Status: DC

## 2020-06-21 MED ORDER — VALSARTAN-HYDROCHLOROTHIAZIDE 320-25 MG PO TABS: 1.0000 | ORAL_TABLET | Freq: Every day | ORAL | 5 refills | Status: DC

## 2020-09-04 ENCOUNTER — Other Ambulatory Visit: Payer: Self-pay

## 2020-09-05 ENCOUNTER — Ambulatory Visit: Payer: No Typology Code available for payment source | Admitting: Medical

## 2020-09-05 ENCOUNTER — Encounter: Payer: Self-pay | Admitting: Medical

## 2020-09-05 VITALS — BP 121/95 | HR 98 | Resp 20 | Ht 64.0 in | Wt 236.0 lb

## 2020-09-05 DIAGNOSIS — F419 Anxiety disorder, unspecified: Secondary | ICD-10-CM | POA: Diagnosis not present

## 2020-09-05 DIAGNOSIS — I10 Essential (primary) hypertension: Secondary | ICD-10-CM

## 2020-09-05 DIAGNOSIS — E785 Hyperlipidemia, unspecified: Secondary | ICD-10-CM

## 2020-09-05 DIAGNOSIS — R739 Hyperglycemia, unspecified: Secondary | ICD-10-CM

## 2020-09-05 DIAGNOSIS — G4700 Insomnia, unspecified: Secondary | ICD-10-CM

## 2020-09-05 DIAGNOSIS — R0683 Snoring: Secondary | ICD-10-CM

## 2020-09-05 LAB — HEMOGLOBIN A1C: Hgb A1c MFr Bld: 6.1 % (ref 4.6–6.5)

## 2020-09-05 LAB — COMPREHENSIVE METABOLIC PANEL
ALT: 14 U/L (ref 0–35)
AST: 16 U/L (ref 0–37)
Albumin: 4.1 g/dL (ref 3.5–5.2)
Alkaline Phosphatase: 110 U/L (ref 39–117)
BUN: 17 mg/dL (ref 6–23)
CO2: 34 mEq/L — ABNORMAL HIGH (ref 19–32)
Calcium: 9.7 mg/dL (ref 8.4–10.5)
Chloride: 102 mEq/L (ref 96–112)
Creatinine, Ser: 0.87 mg/dL (ref 0.40–1.20)
GFR: 71.78 mL/min (ref >60.00)
Glucose, Bld: 105 mg/dL — ABNORMAL HIGH (ref 70–99)
Potassium: 3.9 mEq/L (ref 3.5–5.1)
Sodium: 144 mEq/L (ref 135–145)
Total Bilirubin: 0.4 mg/dL (ref 0.2–1.2)
Total Protein: 7 g/dL (ref 6.0–8.3)

## 2020-09-05 LAB — LIPID PANEL
Cholesterol: 204 mg/dL — ABNORMAL HIGH (ref 0–200)
HDL: 79.9 mg/dL (ref >39.00)
LDL Cholesterol: 111 mg/dL — ABNORMAL HIGH (ref 0–99)
NonHDL: 124.42
Total CHOL/HDL Ratio: 3
Triglycerides: 68 mg/dL (ref 0.0–149.0)
VLDL: 13.6 mg/dL (ref 0.0–40.0)

## 2020-09-05 MED ORDER — CLONAZEPAM 0.5 MG PO TABS: 0.5000 mg | ORAL_TABLET | Freq: Two times a day (BID) | ORAL | 0 refills | Status: DC | PRN

## 2020-09-05 MED ORDER — VALSARTAN-HYDROCHLOROTHIAZIDE 320-25 MG PO TABS: 1.0000 | ORAL_TABLET | Freq: Every day | ORAL | 5 refills | Status: DC

## 2020-09-05 MED ORDER — AMLODIPINE BESYLATE 10 MG PO TABS: ORAL_TABLET | ORAL | 5 refills | Status: DC

## 2020-09-05 MED ORDER — FAMCICLOVIR 500 MG PO TABS: 500.0000 mg | ORAL_TABLET | Freq: Three times a day (TID) | ORAL | 0 refills | Status: DC

## 2020-09-05 NOTE — Progress Notes (Signed)
° °  Subjective:    Patient ID: Janet Case, female    DOB: 01-18-1961, 60 y.o.   MRN: [id number]  HPI  Pt in for follow up.  Pt bp is very high. I had given her refill but her pharmacy did not accept good rx. No cardiac or neurologic signs or symptoms.   Pt has history of anxiety and insomnia. She has not been on anxiety or insomnia meds for 4 months. She lost  Insurance. Changed jobs. Prior job was stressful. New job is also stressful. Was on sertraline and clonazepam. Also was on zaleplon.  Pt does only wants limited supply of benzo. Discussed and not contuing sonata or sertraline.   Recent left upper back pain with pain radiating to shoulder and upper pectoralis area. Pt states rash present. No vesicles seen.  Rash area present 3 weeks.  Skin is sensitive to touch.   Review of Systems     Objective:   Physical Exam  General Mental Status- Alert. General Appearance- Not in acute distress.   Skin Left side upper back near lower trapezius/latissimus dorsi area appears to have forced tiny breaks in epidermis consistent with probable ruptured vesicles.  Also in the left upper breast area 3 small breaks in the epidermis of similar size suspicious for ruptured vesicles and healing epidermis.  Neck Carotid Arteries- Normal color. Moisture- Normal Moisture. No carotid bruits. No JVD.  Chest and Lung Exam Auscultation: Breath Sounds:-Normal.  Cardiovascular Auscultation:Rythm- Regular. Murmurs & Other Heart Sounds:Auscultation of the heart reveals- No Murmurs.  Abdomen Inspection:-Inspeection Normal. Palpation/Percussion:Note:No mass. Palpation and Percussion of the abdomen reveal- Non Tender, Non Distended + BS, no rebound or guarding.    Neurologic Cranial Nerve exam:- CN small -XII intact(No nystagmus), symmetric smile.  Strength:- 5/5 equal and symmetric strength both upper and lower extremities.  Breast exam-breasts are symmetric.  No discharge from nipples.  No lumps  or masses are felt.  No axillary lymphadenopathy.  Left breast see skin exam.    Assessment & Plan:  History of hypertension and not on your medications for 4 months.  No gross motor or sensory deficits.  Refilling your Norvasc 10 mg tabs a day as well as Diovan-HCT 3 20-25 daily.  Start checking your blood pressures daily.  If you have any cardiac or neurologic signs symptoms then recommend ED evaluation.  History of anxiety and still present though you have been off medications for 4 months.  Seems like you are handling anxiety better than you did with previous job.  After discussion decided to give you 15 tablets to use sparingly.  We will see how you do and determine if you need to be placed back on contract.  For history of high cholesterol and elevated sugar will get CMP, lipid panel and A1c today.  You do appear to have probable shingles eruption based on skin exam, dermatomal pattern and skin sensitivity.  Will prescribe Famvir antiviral.  If you have worse pain or sensitivity let me know and would consider nerve pain medication.  I will think that is indicated presently.  Do recommend you follow through with your screening mammogram upcoming months.  Follow-up in 2 weeks or as needed.  Would ask that you send me a MyChart update in about 5 days with blood pressure blood pressure readings.

## 2020-09-05 NOTE — Patient Instructions (Addendum)
History of hypertension and not on your medications for 4 months.  No gross motor or sensory deficits.  Refilling your Norvasc 10 mg tabs a day as well as Diovan-HCT 3 20-25 daily.  Start checking your blood pressures daily.  If you have any cardiac or neurologic signs symptoms then recommend ED evaluation.  History of anxiety and still present though you have been off medications for 4 months.  Seems like you are handling anxiety better than you did with previous job.  After discussion decided to give you 15 tablets to use sparingly.  We will see how you do and determine if you need to be placed back on contract.  For history of high cholesterol and elevated sugar will get CMP, lipid panel and A1c today.  You do appear to have probable shingles eruption based on skin exam, dermatomal pattern and skin sensitivity.  Will prescribe Famvir antiviral.  If you have worse pain or sensitivity let me know and would consider nerve pain medication.  I will think that is indicated presently.  Do recommend you follow through with your screening mammogram upcoming months.  Follow-up in 2 weeks or as needed.  Would ask that you send me a MyChart update in about 5 days with blood pressure blood pressure readings.

## 2020-09-07 ENCOUNTER — Other Ambulatory Visit (HOSPITAL_BASED_OUTPATIENT_CLINIC_OR_DEPARTMENT_OTHER): Payer: Self-pay | Admitting: Medical

## 2020-09-07 DIAGNOSIS — Z1231 Encounter for screening mammogram for malignant neoplasm of breast: Secondary | ICD-10-CM

## 2020-09-08 ENCOUNTER — Encounter: Payer: Self-pay | Admitting: Medical

## 2020-09-09 ENCOUNTER — Telehealth: Payer: Self-pay | Admitting: Medical

## 2020-09-09 ENCOUNTER — Encounter: Payer: Self-pay | Admitting: Medical

## 2020-09-09 ENCOUNTER — Other Ambulatory Visit: Payer: Self-pay | Admitting: Medical

## 2020-09-09 MED ORDER — TOPIRAMATE 50 MG PO TABS: 50.0000 mg | ORAL_TABLET | Freq: Two times a day (BID) | ORAL | 3 refills | Status: DC

## 2020-09-09 MED ORDER — ROSUVASTATIN CALCIUM 5 MG PO TABS: 5.0000 mg | ORAL_TABLET | Freq: Every day | ORAL | 3 refills | Status: DC

## 2020-09-09 MED ORDER — TRAMADOL HCL 50 MG PO TABS
50.0000 mg | ORAL_TABLET | Freq: Four times a day (QID) | ORAL | 0 refills | Status: AC | PRN
Start: 2020-09-09 — End: 2020-09-14

## 2020-09-09 NOTE — Telephone Encounter (Signed)
Rx tramadol sent to pt pharmacy. 

## 2020-09-09 NOTE — Telephone Encounter (Signed)
Rx crestor sent to pt pharmacy and topamax sent to pt pharmacy.

## 2020-09-10 ENCOUNTER — Encounter: Payer: Self-pay | Admitting: Medical

## 2020-09-10 MED ORDER — MELOXICAM 7.5 MG PO TABS: 7.5000 mg | ORAL_TABLET | Freq: Every day | ORAL | 0 refills | Status: DC

## 2020-09-14 ENCOUNTER — Ambulatory Visit (HOSPITAL_BASED_OUTPATIENT_CLINIC_OR_DEPARTMENT_OTHER)
Admission: RE | Admit: 2020-09-14 | Discharge: 2020-09-14 | Disposition: A | Discharge status: Home / Self Care | Payer: No Typology Code available for payment source | Source: Ambulatory Visit | Attending: Medical | Admitting: Medical

## 2020-09-14 ENCOUNTER — Other Ambulatory Visit: Payer: Self-pay

## 2020-09-14 DIAGNOSIS — Z1231 Encounter for screening mammogram for malignant neoplasm of breast: Secondary | ICD-10-CM | POA: Diagnosis present

## 2020-09-18 ENCOUNTER — Encounter: Payer: Self-pay | Admitting: Medical

## 2020-09-20 ENCOUNTER — Ambulatory Visit (INDEPENDENT_AMBULATORY_CARE_PROVIDER_SITE_OTHER): Payer: No Typology Code available for payment source | Admitting: Medical

## 2020-09-20 ENCOUNTER — Other Ambulatory Visit: Payer: Self-pay

## 2020-09-20 DIAGNOSIS — I10 Essential (primary) hypertension: Secondary | ICD-10-CM | POA: Diagnosis not present

## 2020-09-20 NOTE — Progress Notes (Addendum)
Pt here for Blood pressure check per Esperanza Richters, PA-C  Pt currently takes: Diovan-HCT 320-25 mg 1 tablet by mouth daily  Norvasc 10 mg 1 tablet by mouth daily   Pt reports compliance with medication.  BP today @ =146/94 HR =117   Pt advised per Ramon Dredge he will contact the pt later to go over instructions.  With bp level elevted and pulse would add toprol xl 25 mg daily.  Esperanza Richters, PA-C

## 2020-09-23 ENCOUNTER — Telehealth: Payer: Self-pay | Admitting: Medical

## 2020-09-23 MED ORDER — METOPROLOL SUCCINATE ER 25 MG PO TB24: 25.0000 mg | ORAL_TABLET | Freq: Every day | ORAL | 0 refills | Status: DC

## 2020-09-23 NOTE — Telephone Encounter (Signed)
Pt form she dropped off last week is little bit  confusing. Not standard leave form. Not sure how I should fill out. She needs to make appointment to fill out.  She had probable shingles recently and treated. Need to discuss how this impact ability to work.  Also form appears to ask some questions that cover other area other than recent probable shingles.

## 2020-09-23 NOTE — Telephone Encounter (Signed)
Pt notified via mychart

## 2020-09-23 NOTE — Telephone Encounter (Signed)
With bp elevated on last nurse bp check and pulse elevated. Recommend continue diovan/hctz, amlodipine and add toprol xl 25 mg daily.  Follow up office visit with me in 10 days or as needed.  Esperanza Richters, PA-C

## 2020-09-27 ENCOUNTER — Other Ambulatory Visit: Payer: Self-pay | Admitting: Medical

## 2020-09-30 NOTE — Telephone Encounter (Signed)
Declined refill of tramadol, benzo and famvir. Have her make appointment this week. She has a form for me to fill out as well. Regarding I believe her possible shingles. Want her to have controlled med visit, maybe fill form and see if still needs famvir.

## 2020-10-01 NOTE — Telephone Encounter (Signed)
Patient has an appointment on 10/04/20

## 2020-10-03 ENCOUNTER — Telehealth (INDEPENDENT_AMBULATORY_CARE_PROVIDER_SITE_OTHER): Payer: No Typology Code available for payment source | Admitting: Medical

## 2020-10-03 ENCOUNTER — Encounter: Payer: Self-pay | Admitting: Medical

## 2020-10-03 VITALS — BP 128/87

## 2020-10-03 DIAGNOSIS — G629 Polyneuropathy, unspecified: Secondary | ICD-10-CM

## 2020-10-03 DIAGNOSIS — B029 Zoster without complications: Secondary | ICD-10-CM | POA: Diagnosis not present

## 2020-10-03 MED ORDER — TRAMADOL HCL 50 MG PO TABS: 50.0000 mg | ORAL_TABLET | Freq: Four times a day (QID) | ORAL | 0 refills | Status: DC | PRN

## 2020-10-03 MED ORDER — METOPROLOL SUCCINATE ER 25 MG PO TB24: 25.0000 mg | ORAL_TABLET | Freq: Every day | ORAL | 3 refills | Status: DC

## 2020-10-03 NOTE — Progress Notes (Signed)
   Subjective:    Patient ID: Janet Case, female    DOB: 1961-06-12, 60 y.o.   MRN: [id number]  HPI  Virtual Visit via Video Note  I connected with Smita L Slifer on 10/03/20 at  8:00 AM EDT by a video enabled telemedicine application and verified that I am speaking with the correct person using two identifiers.  Location: Patient: home Provider: office  Particpants- pt and myself.   I discussed the limitations of evaluation and management by telemedicine and the availability of in person appointments. The patient expressed understanding and agreed to proceed.  History of Present Illness: Pt had nerve pain dermatomal pattern that was in left upper back toward shoulder with mild rash outbreak but when took antiviral pain did decrease. By time I saw pt she was 3-4 weeks into pain. Still has sharp occasional pain.  At times pain level can be as high as 6/10.   Pt bp is now 128/87. Pt bp improved with addition of metoprolol.  No pulse reading today but when she has been checking blood pressure she has no pulse rate less than 60.   Observations/Objective:  General-no acute distress, pleasant, oriented. Lungs- on inspection lungs appear unlabored. Neck- no tracheal deviation or jvd on inspection. Neuro- gross motor function appears intact.  Assessment and Plan: History of shingles with some moderate to severe postherpetic neuralgia type pain at times.  Enough on Sunday stat distracts her from her work.  Will prescribe 16 tablets of tramadol to use 1 tablet p.o. as needed severe nerve pain.  Filled out work Therapist, art.  Last after this fax over forms.  History of hypertension.  With recent addition of metoprolol 25mg  daily her blood pressure has improved.  Refill medication today.  Follow-up in 2 months or as needed.  , PA-C  Follow Up Instructions:    I discussed the assessment and treatment plan with the patient. The patient was provided an opportunity to  ask questions and all were answered. The patient agreed with the plan and demonstrated an understanding of the instructions.   The patient was advised to call back or seek an in-person evaluation if the symptoms worsen or if the condition fails to improve as anticipated.  Time spent with patient today was 30 minutes which consisted of chart revdiew, discussing diagnosis, work up treatment and documentation.   Esperanza Richters, PA-C   Review of Systems     Objective:   Physical Exam        Assessment & Plan:

## 2020-10-03 NOTE — Patient Instructions (Signed)
History of shingles with some moderate to severe postherpetic neuralgia type pain at times.  Enough on Sunday stat distracts her from her work.  Will prescribe 16 tablets of tramadol to use 1 tablet p.o. as needed severe nerve pain.  Filled out work Therapist, art.  Last after this fax over forms.  History of hypertension.  With recent addition of metoprolol 25mg  daily her blood pressure has improved.  Refill medication today.  Follow-up in 2 months or as needed.

## 2020-10-04 ENCOUNTER — Telehealth: Payer: No Typology Code available for payment source | Admitting: Medical

## 2020-10-04 ENCOUNTER — Other Ambulatory Visit: Payer: Self-pay | Admitting: Medical

## 2020-10-04 NOTE — Telephone Encounter (Signed)
Requesting: Clonazepam Contract: 04/19/19  UDS: 04/19/19 Last Visit: 10/03/20 (video) Next Visit: none Last Refill: 09/05/20  Please Advise

## 2020-10-05 MED ORDER — CLONAZEPAM 0.5 MG PO TABS
0.5000 mg | ORAL_TABLET | Freq: Two times a day (BID) | ORAL | 0 refills | Status: DC | PRN
Start: 2020-10-05 — End: 2021-01-17

## 2020-10-05 NOTE — Telephone Encounter (Signed)
Refill of clonazepam sent to pt pharmacy. Before she runs out of this supply of med advise her she will need to schedule in office visit to renew contract and give uds.

## 2021-01-05 ENCOUNTER — Other Ambulatory Visit: Payer: Self-pay | Admitting: Medical

## 2021-01-07 NOTE — Telephone Encounter (Signed)
Requesting: tramadol & klonpnin  Contract:04/2019 UDS:04/2019 Last Visit:09/20/2020 Next Visit: n/a Last Refill:10/05/20   Please Advise      Called pt to schedule a controlled visit , left a voicemail to return call to make an appointment , please either refill meds or deny request

## 2021-01-08 NOTE — Telephone Encounter (Signed)
Medications sent in.

## 2021-01-17 ENCOUNTER — Ambulatory Visit: Payer: No Typology Code available for payment source | Admitting: Medical

## 2021-01-17 ENCOUNTER — Encounter: Payer: Self-pay | Admitting: Medical

## 2021-01-17 ENCOUNTER — Other Ambulatory Visit: Payer: Self-pay

## 2021-01-17 VITALS — BP 138/80 | HR 87 | Resp 18 | Ht 64.0 in | Wt 235.6 lb

## 2021-01-17 DIAGNOSIS — Z79899 Other long term (current) drug therapy: Secondary | ICD-10-CM

## 2021-01-17 DIAGNOSIS — R739 Hyperglycemia, unspecified: Secondary | ICD-10-CM | POA: Diagnosis not present

## 2021-01-17 DIAGNOSIS — F419 Anxiety disorder, unspecified: Secondary | ICD-10-CM

## 2021-01-17 DIAGNOSIS — I10 Essential (primary) hypertension: Secondary | ICD-10-CM | POA: Diagnosis not present

## 2021-01-17 DIAGNOSIS — F32A Depression, unspecified: Secondary | ICD-10-CM

## 2021-01-17 DIAGNOSIS — E785 Hyperlipidemia, unspecified: Secondary | ICD-10-CM | POA: Diagnosis not present

## 2021-01-17 LAB — COMPREHENSIVE METABOLIC PANEL
ALT: 15 U/L (ref 0–35)
AST: 18 U/L (ref 0–37)
Albumin: 4.4 g/dL (ref 3.5–5.2)
Alkaline Phosphatase: 136 U/L — ABNORMAL HIGH (ref 39–117)
BUN: 12 mg/dL (ref 6–23)
CO2: 28 mEq/L (ref 19–32)
Calcium: 10 mg/dL (ref 8.4–10.5)
Chloride: 104 mEq/L (ref 96–112)
Creatinine, Ser: 0.95 mg/dL (ref 0.40–1.20)
GFR: 64.42 mL/min (ref >60.00)
Glucose, Bld: 100 mg/dL — ABNORMAL HIGH (ref 70–99)
Potassium: 3.7 mEq/L (ref 3.5–5.1)
Sodium: 141 mEq/L (ref 135–145)
Total Bilirubin: 0.6 mg/dL (ref 0.2–1.2)
Total Protein: 7.3 g/dL (ref 6.0–8.3)

## 2021-01-17 LAB — LIPID PANEL
Cholesterol: 190 mg/dL (ref 0–200)
HDL: 63.9 mg/dL (ref >39.00)
LDL Cholesterol: 111 mg/dL — ABNORMAL HIGH (ref 0–99)
NonHDL: 125.91
Total CHOL/HDL Ratio: 3
Triglycerides: 73 mg/dL (ref 0.0–149.0)
VLDL: 14.6 mg/dL (ref 0.0–40.0)

## 2021-01-17 LAB — HEMOGLOBIN A1C: Hgb A1c MFr Bld: 6 % (ref 4.6–6.5)

## 2021-01-17 MED ORDER — TOBRAMYCIN 0.3 % OP SOLN: 2.0000 [drp] | Freq: Four times a day (QID) | OPHTHALMIC | 0 refills | Status: DC

## 2021-01-17 MED ORDER — CLONAZEPAM 0.5 MG PO TABS: 0.5000 mg | ORAL_TABLET | Freq: Two times a day (BID) | ORAL | 1 refills | Status: DC | PRN

## 2021-01-17 MED ORDER — ZALEPLON 10 MG PO CAPS: ORAL_CAPSULE | ORAL | 1 refills | Status: DC

## 2021-01-17 MED ORDER — SERTRALINE HCL 50 MG PO TABS: ORAL_TABLET | ORAL | 3 refills | Status: DC

## 2021-01-17 NOTE — Patient Instructions (Addendum)
Your blood pressure is better controlled on second BP check today.  Not ideal but reasonably controlled.  Continue amlodipine, Toprol and valsartan.  For history of hyperlipidemia continue Crestor.  For history elevated sugar recommend low sugar diet and daily exercise.  We will check your A1c today.  We will let you know results.  For depression and anxiety refilled her clonazepam and want you to start sertraline.  Rx advisement given on both medication.  Continue to use clonazepam sparingly.  Do recommend following through with therapy appointments.  History of insomnia refilled Sonata.  Rx advisement given particularly not to use both Sonata and clonazepam at the same time.  Patient expressed understanding.  Patient is up-to-date on both UDS and controlled med contract.  Recent left eye medial aspect matting in the morning for the past 2 weeks.  I will prescribe Tobrex eyedrops and you can do warm compresses twice daily.  If discharge persists then let me know and we will refer to ophthalmologist.  Follow-up date to be determined after lab review.

## 2021-01-17 NOTE — Progress Notes (Signed)
Subjective:    Patient ID: Janet Case, female    DOB: 03-31-1961, 60 y.o.   MRN: [id number]  HPI  Pt in for follow up.  Has both anxiety and insomnia.  Pt uses both xanax and sonata. Limited # of tabs given and she is aware of strict advisement not to use both together.  For depressed mood was on sertraline 50 mg daily but never used.    Phq-9 score is 13.  Pt is working at [employer].  Hx of htn and amlodipine, toprol and valsartan.  Has high cholesterol history and on crestor 5 mg daily.  Pt also mentions some hx of intetmittent matting to left eye medial corner. This has been going on for 2 weeks. No vision change, no eye pressure of blurred vision.    Review of Systems  Constitutional:  Negative for chills, fatigue and fever.  HENT:  Negative for congestion and ear discharge.   Respiratory:  Negative for cough, chest tightness, shortness of breath and wheezing.   Cardiovascular:  Negative for chest pain and palpitations.  Gastrointestinal:  Negative for abdominal pain and rectal pain.  Genitourinary:  Negative for dysuria.  Musculoskeletal:  Negative for back pain.  Neurological:  Negative for dizziness, syncope, weakness and headaches.  Hematological:  Negative for adenopathy. Does not bruise/bleed easily.  Psychiatric/Behavioral:  Positive for dysphoric mood. Negative for agitation, behavioral problems, confusion and decreased concentration. The patient is nervous/anxious.        Anxiety is worse. Mood  depressed at times. Pt seeing therapist at her work.    Past Medical History:  Diagnosis Date   Anxiety    Clonic hemifacial spasm    Left Facial   Colon polyps    Depression    Hypertension    Muscle spasms of head and/or neck      Social History   Socioeconomic History   Marital status: Divorced    Spouse name: Not on file   Number of children: 2   Years of education: Not on file   Highest education level: Not on file  Occupational History    Occupation: Occupational psychologist    Comment: [employer]  Tobacco Use   Smoking status: Never   Smokeless tobacco: Never  Substance and Sexual Activity   Alcohol use: No    Alcohol/week: 0.0 standard drinks   Drug use: No   Sexual activity: Never  Other Topics Concern   Not on file  Social History Narrative   Not on file   Social Determinants of Health   Financial Resource Strain: Not on file  Food Insecurity: Not on file  Transportation Needs: Not on file  Physical Activity: Not on file  Stress: Not on file  Social Connections: Not on file  Intimate Partner Violence: Not on file    Past Surgical History:  Procedure Laterality Date   POLYPECTOMY     Oral   TUBAL LIGATION     VAGINAL WOUND CLOSURE / REPAIR  1977   WISDOM TOOTH EXTRACTION      Family History  Problem Relation Age of Onset   Hyperlipidemia Mother 52       Deceased   Heart disease Mother    Congestive Heart Failure Mother    Hypertension Mother    Kidney disease Mother    Diabetes Mother    Dementia Mother    Heart defect Mother    Kidney disease Father 87       Deceased  Heart attack Father    Hypertension Sister    Diabetes Sister    Breast cancer Sister    Depression Sister    Anxiety disorder Sister    Thyroid cancer Sister        Thyroid Removed   Leukemia Maternal Aunt    Allergies Daughter    GI problems Daughter     Allergies  Allergen Reactions   Influenza Vaccines Nausea Only    Current Outpatient Medications on File Prior to Visit  Medication Sig Dispense Refill   amLODipine (NORVASC) 10 MG tablet TAKE 1 TABLET(10 MG) BY MOUTH DAILY 30 tablet 5   clonazePAM (KLONOPIN) 0.5 MG tablet Take 1 tablet (0.5 mg total) by mouth 2 (two) times daily as needed for anxiety. 15 tablet 0   famciclovir (FAMVIR) 500 MG tablet Take 1 tablet (500 mg total) by mouth 3 (three) times daily. 21 tablet 0   meloxicam (MOBIC) 7.5 MG tablet TAKE 1 TABLET BY MOUTH EVERY DAY 14 tablet 0    metoprolol succinate (TOPROL-XL) 25 MG 24 hr tablet Take 1 tablet (25 mg total) by mouth daily. 90 tablet 3   rosuvastatin (CRESTOR) 5 MG tablet Take 1 tablet (5 mg total) by mouth daily. 90 tablet 3   sertraline (ZOLOFT) 50 MG tablet TAKE 1 TABLET(50 MG) BY MOUTH DAILY 30 tablet 3   topiramate (TOPAMAX) 50 MG tablet TAKE 1 TABLET BY MOUTH TWICE A DAY 180 tablet 1   valsartan-hydrochlorothiazide (DIOVAN-HCT) 320-25 MG tablet Take 1 tablet by mouth daily. 30 tablet 5   zaleplon (SONATA) 10 MG capsule 1 tab po q hs prn insomnia 10 capsule 0   No current facility-administered medications on file prior to visit.    BP 140/90   Pulse 87   Resp 18   Ht 5\' 4"  (1.626 m)   Wt 235 lb 9.6 oz (106.9 kg)   SpO2 98%   BMI 40.44 kg/m       Objective:   Physical Exam   General Mental Status- Alert. General Appearance- Not in acute distress.   Skin General: Color- Normal Color. Moisture- Normal Moisture.  Neck Carotid Arteries- Normal color. Moisture- Normal Moisture. No carotid bruits. No JVD.  Chest and Lung Exam Auscultation: Breath Sounds:-Normal.  Cardiovascular Auscultation:Rythm- Regular. Murmurs & Other Heart Sounds:Auscultation of the heart reveals- No Murmurs.  Abdomen Inspection:-Inspeection Normal. Palpation/Percussion:Note:No mass. Palpation and Percussion of the abdomen reveal- Non Tender, Non Distended + BS, no rebound or guarding.    Neurologic Cranial Nerve exam:- CN III-XII intact(No nystagmus), symmetric smile. Strength:- 5/5 equal and symmetric strength both upper and lower extremities.        Assessment & Plan:   Your blood pressure is better controlled on second BP check today.  Not ideal but reasonably controlled.  Continue amlodipine, Toprol and valsartan.  For history of hyperlipidemia continue Crestor.  For history elevated sugar recommend low sugar diet and daily exercise.  We will check your A1c today.  We will let you know results.  For  depression and anxiety refilled her clonazepam and want you to start sertraline.  Rx advisement given on both medication.  Continue to use clonazepam sparingly.  Do recommend following through with therapy appointments.  History of insomnia refilled Sonata.  Rx advisement given particularly not to use both Sonata and clonazepam at the same time.  Patient expressed understanding.  Patient is up-to-date on both UDS and controlled med contract.  Follow-up date to be determined after lab review.  Mackie Pai, PA-C

## 2021-01-22 LAB — DRUG MONITORING, PANEL 8 WITH CONFIRMATION, URINE
6 Acetylmorphine: NEGATIVE ng/mL (ref <10)
Alcohol Metabolites: NEGATIVE ng/mL (ref <500)
Amphetamines: NEGATIVE ng/mL (ref <500)
Benzodiazepines: NEGATIVE ng/mL (ref <100)
Buprenorphine, Urine: NEGATIVE ng/mL (ref <5)
Cocaine Metabolite: NEGATIVE ng/mL (ref <150)
Creatinine: 93.2 mg/dL (ref >=20.0)
MDMA: NEGATIVE ng/mL (ref <500)
Marijuana Metabolite: NEGATIVE ng/mL (ref <20)
Opiates: NEGATIVE ng/mL (ref <100)
Oxidant: NEGATIVE ug/mL (ref <200)
Oxycodone: NEGATIVE ng/mL (ref <100)
pH: 5.4 (ref 4.5–9.0)

## 2021-01-22 LAB — DM TEMPLATE

## 2021-02-01 ENCOUNTER — Other Ambulatory Visit: Payer: Self-pay | Admitting: Medical

## 2021-02-03 ENCOUNTER — Other Ambulatory Visit: Payer: Self-pay | Admitting: Medical

## 2021-02-04 ENCOUNTER — Encounter: Payer: Self-pay | Admitting: Medical

## 2021-02-04 NOTE — Telephone Encounter (Addendum)
Requesting: tramadol  Contract:01/27/2021 UDS:01/17/2021 Last Visit:01/17/2021 Next Visit:n/a Last Refill:  Please Advise   Will give limited 16 tab rx refill.   Esperanza Richters, PA-C

## 2021-02-13 ENCOUNTER — Encounter: Payer: Self-pay | Admitting: Medical

## 2021-02-13 NOTE — Telephone Encounter (Signed)
Is it okay to make changes to form?

## 2021-02-24 NOTE — Telephone Encounter (Signed)
Colleen called and lvm to return call in regards to extension of FMLA

## 2021-02-28 ENCOUNTER — Other Ambulatory Visit: Payer: Self-pay | Admitting: Medical

## 2021-03-01 ENCOUNTER — Telehealth: Payer: Self-pay | Admitting: Medical

## 2021-03-01 MED ORDER — MELOXICAM 7.5 MG PO TABS: 7.5000 mg | ORAL_TABLET | Freq: Every day | ORAL | 0 refills | Status: DC

## 2021-03-01 MED ORDER — CLONAZEPAM 0.5 MG PO TABS: 0.5000 mg | ORAL_TABLET | Freq: Two times a day (BID) | ORAL | 0 refills | Status: DC | PRN

## 2021-03-01 NOTE — Telephone Encounter (Signed)
Regarding fmla paper modifications. Typically many companies will ask me to fill out form again in entirety with updated information.   They don't seem to like addendums or crossing out date with initials?  Because of this you can typically have pt come in if they need paperwork modified. Or if you could get blank copy and fill out the form again. Leave the section that I need to update. But get clear instruction on date modifications.  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 So appointment or would you fill out.

## 2021-03-03 NOTE — Telephone Encounter (Signed)
Patient called and stated she will have HR fax over blank FMLA

## 2021-03-04 NOTE — Telephone Encounter (Signed)
Blank form placed in desk under " A "

## 2021-03-04 NOTE — Telephone Encounter (Signed)
Sent patient a message via mychat as well , to schedule an appointment

## 2021-03-04 NOTE — Telephone Encounter (Signed)
Called pt and lvm to return call to schedule an appointment  

## 2021-03-04 NOTE — Telephone Encounter (Signed)
Blank FMLA placed in your basket , you have the most recent copy of her FMLA forms , which was given to you on 02/13/2021.   Please advise .

## 2021-03-06 ENCOUNTER — Telehealth: Payer: Self-pay | Admitting: Medical

## 2021-03-06 NOTE — Telephone Encounter (Signed)
I went ahead and called pt. Filled out her fmla form. Please fax, keep confrimation page with copy in file.

## 2021-03-06 NOTE — Telephone Encounter (Signed)
Opened to review and fill out fmla form.

## 2021-03-06 NOTE — Telephone Encounter (Signed)
Form faxed, scanned and filed

## 2021-03-20 ENCOUNTER — Other Ambulatory Visit: Payer: Self-pay | Admitting: Medical

## 2021-03-20 MED ORDER — MELOXICAM 7.5 MG PO TABS: 7.5000 mg | ORAL_TABLET | Freq: Every day | ORAL | 0 refills | Status: DC

## 2021-03-20 NOTE — Telephone Encounter (Signed)
Multiple controlled substance refill requests  Zaleplon 10mg  LF: 01/17/2021 #10 and 1RF  Clonazepam 0.5mg  LF: 03/01/2021 #15 and 0RF  Meloxicam 7.5mg  LF: 03/01/2021 #14 and 0RF  Last OV: 01/17/2021 UDS: 01/17/2021 Contract: 01/17/2021

## 2021-04-08 ENCOUNTER — Other Ambulatory Visit: Payer: Self-pay | Admitting: Medical

## 2021-04-12 ENCOUNTER — Other Ambulatory Visit: Payer: Self-pay | Admitting: Medical

## 2021-04-13 ENCOUNTER — Encounter: Payer: Self-pay | Admitting: Medical

## 2021-04-14 NOTE — Telephone Encounter (Addendum)
Requesting: clonazepam 0.5mg , Sonata 10mg  Contract: 01/17/2021 UDS: 01/17/2021 Last Visit: 01/17/2021 Next Visit: None Last Refill on Sonata: 01/17/2021 #10 and 1RF Last Refill on clonazepam: 03/01/2021 #15 and 0RF  Please Advise.  Rx refill sent to pt pharmacy.  03/03/2021, PA-C

## 2021-05-10 ENCOUNTER — Other Ambulatory Visit: Payer: Self-pay | Admitting: Medical

## 2021-05-17 ENCOUNTER — Other Ambulatory Visit: Payer: Self-pay | Admitting: Medical

## 2021-05-19 MED ORDER — CLONAZEPAM 0.5 MG PO TABS: 0.5000 mg | ORAL_TABLET | Freq: Two times a day (BID) | ORAL | 0 refills | Status: DC | PRN

## 2021-05-19 NOTE — Telephone Encounter (Addendum)
Requesting: klonopin  Contract:01/27/21 UDS:01/17/21 Last Visit:01/17/21 Next Visit:06/03/21 Last Refill:04/16/21  Please Advise   Rx 15 tab refill sent to pharmacy.

## 2021-05-29 ENCOUNTER — Other Ambulatory Visit: Payer: Self-pay | Admitting: Medical

## 2021-05-30 ENCOUNTER — Encounter: Payer: Self-pay | Admitting: Medical

## 2021-05-30 NOTE — Telephone Encounter (Signed)
Ok please deny medications.

## 2021-05-30 NOTE — Telephone Encounter (Signed)
Requesting: tramadol & klonopin  Contract:01/27/21 UDS:01/17/21 Last Visit:01/17/21 Next Visit:06/03/21 Last Refill:klonipin(11/7) & tramadol  Please Advise

## 2021-06-03 ENCOUNTER — Other Ambulatory Visit: Payer: Self-pay | Admitting: Medical

## 2021-06-03 ENCOUNTER — Ambulatory Visit: Payer: No Typology Code available for payment source | Admitting: Medical

## 2021-06-30 ENCOUNTER — Encounter: Payer: Self-pay | Admitting: Medical

## 2021-06-30 ENCOUNTER — Telehealth: Payer: Self-pay

## 2021-06-30 ENCOUNTER — Ambulatory Visit (INDEPENDENT_AMBULATORY_CARE_PROVIDER_SITE_OTHER): Payer: No Typology Code available for payment source | Admitting: Medical

## 2021-06-30 VITALS — BP 130/87 | HR 87 | Temp 98.2°F | Resp 16 | Ht 64.0 in | Wt 228.0 lb

## 2021-06-30 DIAGNOSIS — F32A Depression, unspecified: Secondary | ICD-10-CM

## 2021-06-30 DIAGNOSIS — E785 Hyperlipidemia, unspecified: Secondary | ICD-10-CM

## 2021-06-30 DIAGNOSIS — F419 Anxiety disorder, unspecified: Secondary | ICD-10-CM

## 2021-06-30 DIAGNOSIS — I10 Essential (primary) hypertension: Secondary | ICD-10-CM

## 2021-06-30 DIAGNOSIS — G4700 Insomnia, unspecified: Secondary | ICD-10-CM | POA: Diagnosis not present

## 2021-06-30 DIAGNOSIS — R739 Hyperglycemia, unspecified: Secondary | ICD-10-CM

## 2021-06-30 MED ORDER — ZALEPLON 10 MG PO CAPS: ORAL_CAPSULE | ORAL | 1 refills | Status: DC

## 2021-06-30 MED ORDER — CLONAZEPAM 0.5 MG PO TABS: 0.5000 mg | ORAL_TABLET | Freq: Two times a day (BID) | ORAL | 0 refills | Status: DC | PRN

## 2021-06-30 NOTE — Progress Notes (Signed)
Subjective:    Patient ID: Janet Case, female    DOB: 23-Jan-1961, 60 y.o.   MRN: [id number]  HPI  Pt in for follow up.  Bp high initially. She is on amlodipine, Toprol and valsartan.   For history of hyperlipidemia continue Crestor. Pt is on 5 mg. Slight ldl elevation in summer.   For history elevated sugar. A1c was 6 in the summer.   For depression and anxiety refilled her clonazepam and want you to start sertraline.  Rx advisement given on both medication.  Continue to use clonazepam sparingly.  Do recommend following through with therapy appointments.   History of insomnia refilled Sonata.  Rx advisement given particularly not to use both Sonata and clonazepam at the same time.  Patient expressed understanding.  She is up to date on uds and controlled med contract.    Pt states she has been sick since 06/17/2021. She viral syndrome. States at onset had fatigue, nasal congestion and body aches.  followed by bronchitis symptoms. Pt tells me this started on some leave days. When got back on 06/16/2021 she was sick. She was wheezing and had chest congestion. She self treated sicne 06/16/2021. She took dayqul and corcidin. Now pt feels better. Only has residual cough. Pt first day back to work was today.  Review of Systems  Constitutional:  Negative for chills, fatigue and fever.  HENT:  Negative for congestion, ear discharge and ear pain.   Respiratory:  Negative for cough, chest tightness and wheezing.   Cardiovascular:  Negative for chest pain and palpitations.  Gastrointestinal:  Negative for abdominal distention, abdominal pain, constipation and nausea.  Genitourinary:  Negative for dysuria.  Musculoskeletal:  Negative for back pain, myalgias and neck pain.  Skin:  Negative for rash.  Neurological:  Negative for dizziness and numbness.  Hematological:  Negative for adenopathy. Does not bruise/bleed easily.  Psychiatric/Behavioral:  Positive for sleep disturbance. Negative for  behavioral problems, dysphoric mood and suicidal ideas. The patient is nervous/anxious.     Past Medical History:  Diagnosis Date   Anxiety    Clonic hemifacial spasm    Left Facial   Colon polyps    Depression    Hypertension    Muscle spasms of head and/or neck      Social History   Socioeconomic History   Marital status: Divorced    Spouse name: Not on file   Number of children: 2   Years of education: Not on file   Highest education level: Not on file  Occupational History   Occupation: Occupational psychologist    Comment: [employer]  Tobacco Use   Smoking status: Never   Smokeless tobacco: Never  Substance and Sexual Activity   Alcohol use: No    Alcohol/week: 0.0 standard drinks   Drug use: No   Sexual activity: Never  Other Topics Concern   Not on file  Social History Narrative   Not on file   Social Determinants of Health   Financial Resource Strain: Not on file  Food Insecurity: Not on file  Transportation Needs: Not on file  Physical Activity: Not on file  Stress: Not on file  Social Connections: Not on file  Intimate Partner Violence: Not on file    Past Surgical History:  Procedure Laterality Date   POLYPECTOMY     Oral   TUBAL LIGATION     VAGINAL WOUND CLOSURE / REPAIR  1977   WISDOM TOOTH EXTRACTION  Family History  Problem Relation Age of Onset   Hyperlipidemia Mother 37       Deceased   Heart disease Mother    Congestive Heart Failure Mother    Hypertension Mother    Kidney disease Mother    Diabetes Mother    Dementia Mother    Heart defect Mother    Kidney disease Father 73       Deceased   Heart attack Father    Hypertension Sister    Diabetes Sister    Breast cancer Sister    Depression Sister    Anxiety disorder Sister    Thyroid cancer Sister        Thyroid Removed   Leukemia Maternal Aunt    Allergies Daughter    GI problems Daughter     Allergies  Allergen Reactions   Influenza Vaccines Nausea Only     Current Outpatient Medications on File Prior to Visit  Medication Sig Dispense Refill   amLODipine (NORVASC) 10 MG tablet Take 1 tablet (10 mg total) by mouth daily. 30 tablet 0   famciclovir (FAMVIR) 500 MG tablet Take 1 tablet (500 mg total) by mouth 3 (three) times daily. 21 tablet 0   meloxicam (MOBIC) 7.5 MG tablet Take 1 tablet (7.5 mg total) by mouth daily. 14 tablet 0   metoprolol succinate (TOPROL-XL) 25 MG 24 hr tablet Take 1 tablet (25 mg total) by mouth daily. 90 tablet 3   rosuvastatin (CRESTOR) 5 MG tablet TAKE 1 TABLET (5 MG TOTAL) BY MOUTH DAILY. 30 tablet 0   sertraline (ZOLOFT) 50 MG tablet TAKE 1 TABLET BY MOUTH ONCE DAILY 90 tablet 0   tobramycin (TOBREX) 0.3 % ophthalmic solution Place 2 drops into the left eye every 6 (six) hours. 5 mL 0   topiramate (TOPAMAX) 50 MG tablet Take 1 tablet (50 mg total) by mouth 2 (two) times daily. 60 tablet 0   valsartan-hydrochlorothiazide (DIOVAN-HCT) 320-25 MG tablet Take 1 tablet by mouth daily. 30 tablet 5   No current facility-administered medications on file prior to visit.    BP 130/87    Pulse 87    Temp 98.2 F (36.8 C) (Oral)    Resp 16    Ht 5\' 4"  (1.626 m)    Wt 228 lb (103.4 kg)    SpO2 100%    BMI 39.14 kg/m       Objective:   Physical Exam  General- No acute distress. Pleasant patient. Neck- Full range of motion, no jvd Lungs- Clear, even and unlabored. Heart- regular rate and rhythm. Neurologic- CNII- XII grossly intact.       Assessment & Plan:   Patient Instructions  Hypertension-blood pressure well controlled recheck.  Continue amlodipine, Toprol and valsartan.  Hyperlipidemia.  Continue Crestor 5 mg daily.  Placing future metabolic panel and A1c to be done within a couple weeks.  Asked that she schedule lab on the way out or call back to schedule appointment.  Elevated sugar.  Last A1c in December showed A1c of 6.  Recommend low sugar diet and regular exercise.  Placing A1c as future lab as  well.  Insomnia-recently well controlled as she slept a lot during and post recent viral illness.  Did not have to use  sonata in lsat week. But reports pitting out from prior refills in October.  Anxiety-well controlled presently but she has not been working.  Typically in the past had a lot of anxiety during work hours.  Patient is  up-to-date on controlled medication contract and UDS.  She is again aware not to use both sonata and clonazepam at the same time.  Recent viral syndrome follow-up bronchitis symptoms.  Duration of illness was December fifth through the 18th.  I did not treat her during this period time she got better with over-the-counter/conservative treatment.  Follow-up FMLA paperwork for this illness.   Follow-up date to be determined after lab review.   Esperanza Richters, PA-C

## 2021-06-30 NOTE — Patient Instructions (Signed)
Hypertension-blood pressure well controlled recheck.  Continue amlodipine, Toprol and valsartan.  Hyperlipidemia.  Continue Crestor 5 mg daily.  Placing future metabolic panel and A1c to be done within a couple weeks.  Asked that she schedule lab on the way out or call back to schedule appointment.  Elevated sugar.  Last A1c in December showed A1c of 6.  Recommend low sugar diet and regular exercise.  Placing A1c as future lab as well.  Insomnia-recently well controlled as she slept a lot during and post recent viral illness.  Did not have to use  sonata in lsat week. But reports pitting out from prior refills in October.  Anxiety-well controlled presently but she has not been working.  Typically in the past had a lot of anxiety during work hours.  Patient is up-to-date on controlled medication contract and UDS.  She is again aware not to use both sonata and clonazepam at the same time.  Recent viral syndrome follow-up bronchitis symptoms.  Duration of illness was December fifth through the 18th.  I did not treat her during this period time she got better with over-the-counter/conservative treatment.  Follow-up FMLA paperwork for this illness.   Follow-up date to be determined after lab review.

## 2021-06-30 NOTE — Telephone Encounter (Signed)
TOC    Patient wants to transfer of care to a female provider .

## 2021-07-01 NOTE — Telephone Encounter (Signed)
Form placed in basket

## 2021-07-02 ENCOUNTER — Telehealth: Payer: Self-pay | Admitting: Medical

## 2021-07-02 NOTE — Telephone Encounter (Signed)
Spoke with pt via mychart , stated she will call office to make the appointment

## 2021-07-02 NOTE — Telephone Encounter (Signed)
Opened to review 

## 2021-07-02 NOTE — Telephone Encounter (Signed)
Pt need to have virtual video or phone visit to discuss her fmla forms. I don't understand her my chart message. Need dedicated time to fill out and discuss with pt. Can schedule for tomorrow or friday

## 2021-07-03 ENCOUNTER — Other Ambulatory Visit: Payer: Self-pay | Admitting: Medical

## 2021-07-04 NOTE — Telephone Encounter (Addendum)
Requesting: tramadol Contract:01/17/2021 UDS:01/17/21 Last Visit:12/19 Next Visit:12/27 Last Refill:  Please Advise   Rx tramadol sent to pt pharmacy. Limited prescription.  Esperanza Richters, PA-C

## 2021-07-05 ENCOUNTER — Other Ambulatory Visit: Payer: Self-pay | Admitting: Medical

## 2021-07-08 ENCOUNTER — Ambulatory Visit: Payer: No Typology Code available for payment source | Admitting: Medical

## 2021-07-08 NOTE — Telephone Encounter (Signed)
Paperwork faxed °

## 2021-07-09 NOTE — Telephone Encounter (Signed)
FMLA form refaxed.

## 2021-07-10 ENCOUNTER — Encounter: Payer: Self-pay | Admitting: Medical

## 2021-07-10 ENCOUNTER — Telehealth: Payer: Self-pay | Admitting: Medical

## 2021-07-10 ENCOUNTER — Ambulatory Visit: Payer: No Typology Code available for payment source | Admitting: Medical

## 2021-07-10 VITALS — BP 120/88 | HR 96 | Temp 99.1°F | Resp 18 | Ht 64.0 in | Wt 224.2 lb

## 2021-07-10 DIAGNOSIS — E785 Hyperlipidemia, unspecified: Secondary | ICD-10-CM

## 2021-07-10 DIAGNOSIS — F419 Anxiety disorder, unspecified: Secondary | ICD-10-CM | POA: Diagnosis not present

## 2021-07-10 DIAGNOSIS — R739 Hyperglycemia, unspecified: Secondary | ICD-10-CM

## 2021-07-10 DIAGNOSIS — I10 Essential (primary) hypertension: Secondary | ICD-10-CM

## 2021-07-10 DIAGNOSIS — F32A Depression, unspecified: Secondary | ICD-10-CM | POA: Diagnosis not present

## 2021-07-10 LAB — LIPID PANEL
Cholesterol: 168 mg/dL (ref 0–200)
HDL: 59 mg/dL (ref >39.00)
LDL Cholesterol: 93 mg/dL (ref 0–99)
NonHDL: 109.35
Total CHOL/HDL Ratio: 3
Triglycerides: 80 mg/dL (ref 0.0–149.0)
VLDL: 16 mg/dL (ref 0.0–40.0)

## 2021-07-10 LAB — COMPREHENSIVE METABOLIC PANEL
ALT: 9 U/L (ref 0–35)
AST: 12 U/L (ref 0–37)
Albumin: 4.2 g/dL (ref 3.5–5.2)
Alkaline Phosphatase: 124 U/L — ABNORMAL HIGH (ref 39–117)
BUN: 17 mg/dL (ref 6–23)
CO2: 25 mEq/L (ref 19–32)
Calcium: 9.5 mg/dL (ref 8.4–10.5)
Chloride: 110 mEq/L (ref 96–112)
Creatinine, Ser: 1.04 mg/dL (ref 0.40–1.20)
GFR: 57.6 mL/min — ABNORMAL LOW (ref >60.00)
Glucose, Bld: 92 mg/dL (ref 70–99)
Potassium: 4 mEq/L (ref 3.5–5.1)
Sodium: 143 mEq/L (ref 135–145)
Total Bilirubin: 0.5 mg/dL (ref 0.2–1.2)
Total Protein: 7 g/dL (ref 6.0–8.3)

## 2021-07-10 LAB — HEMOGLOBIN A1C: Hgb A1c MFr Bld: 6 % (ref 4.6–6.5)

## 2021-07-10 NOTE — Telephone Encounter (Signed)
Wanted to verify that you sent in pt recent flma paperwork. I did not see copy in file?

## 2021-07-10 NOTE — Telephone Encounter (Signed)
Form placed in red folder  

## 2021-07-10 NOTE — Patient Instructions (Addendum)
For anxiety and depression most of time well  controlled with sertraline and clonazepam.   I will review this new form and fill it out for your anxiety and depression. Check to make sure not duplicate as MA may have filled out and sent.    Follow up to be determined after you do labs that I placed the other day.

## 2021-07-10 NOTE — Progress Notes (Signed)
° °  Subjective:    Patient ID: Janet Case, female    DOB: 1961-02-25, 60 y.o.   MRN: [id number]  HPI  Pt in for new fmla paperwork.  I had just filled out forms for viral syndrome and bronchitis.   Also she need fmal intermittent form for anxiety. That was filled out yesterday based on prior form. She coordinated with Dahlia Client MA. We refaxed that yesterday.  Today is another form associated with anxiety.  Pt thinks that Dahlia Client may have faxed this new form.   Anxiety and Depression controlled overall with sertraline and clonazepam.   Pt states has 2 events per month and each episode lasting 3 days.  Review of Systems  Constitutional:  Negative for chills, fatigue and fever.  Respiratory:  Negative for cough, chest tightness and shortness of breath.   Cardiovascular:  Negative for chest pain and palpitations.  Gastrointestinal:  Negative for abdominal pain.  Musculoskeletal:  Negative for back pain, joint swelling and myalgias.  Skin:  Negative for rash.  Hematological:  Negative for adenopathy. Does not bruise/bleed easily.  Psychiatric/Behavioral:  Positive for dysphoric mood. Negative for behavioral problems, confusion, self-injury and suicidal ideas. The patient is nervous/anxious.       Objective:   Physical Exam  General- No acute distress. Pleasant patient. Lungs- Clear, even and unlabored. Heart- regular rate and rhythm. Neurologic- CNII- XII grossly intact.       Assessment & Plan:   Patient Instructions  For anxiety and depression most of time well  controlled with sertraline and clonazepam.   I will review this new form and fill it out for your anxiety and depression. Check to make sure not duplicate as MA may have filled out and sent.    Follow up to be determined after you do labs that I placed the other day.   Esperanza Richters, PA-C

## 2021-07-11 ENCOUNTER — Telehealth: Payer: Self-pay | Admitting: *Deleted

## 2021-07-11 NOTE — Telephone Encounter (Signed)
Prior auth started via cover my meds.  Awaiting determination.   Key: [id number]

## 2021-07-11 NOTE — Telephone Encounter (Signed)
this request is approved from 07/11/2021 to 01/07/2022.

## 2021-07-16 NOTE — Telephone Encounter (Signed)
FMLA from 12/15 faxed

## 2021-07-23 ENCOUNTER — Other Ambulatory Visit: Payer: Self-pay | Admitting: Medical

## 2021-07-30 DIAGNOSIS — Z0279 Encounter for issue of other medical certificate: Secondary | ICD-10-CM

## 2021-08-04 ENCOUNTER — Other Ambulatory Visit: Payer: Self-pay | Admitting: Medical

## 2021-08-04 NOTE — Telephone Encounter (Signed)
Patient states the only thing wrong with her FMLA paperwork is on section 6 of her intermittent fmla. Patient states that right next to where it says "Return to work date", the sate 11/17/21 needs to be written and Ramon Dredge needs to initial right next to it. Patient states that everything else was right. Please advise.

## 2021-08-04 NOTE — Telephone Encounter (Signed)
Form re-faxed

## 2021-08-09 ENCOUNTER — Other Ambulatory Visit: Payer: Self-pay | Admitting: Medical

## 2021-08-11 NOTE — Telephone Encounter (Signed)
Requesting: klonopin  Contract:78/22 UDS:01/17/21 Last Visit:07/10/21 Next Visit:n/a Last Refill:06/30/21  Please Advise

## 2021-08-12 ENCOUNTER — Telehealth: Payer: Self-pay | Admitting: Medical

## 2021-08-12 NOTE — Telephone Encounter (Signed)
This pt put in request to transfer to female provider. I ok'd yet she has not transferred. Will you look into this for me. I don't want to be seeing someone who prefers to see other for whatever reason.  Thanks,

## 2021-08-12 NOTE — Telephone Encounter (Signed)
Laura,are you ok with a TOC?

## 2021-08-13 NOTE — Telephone Encounter (Signed)
Janet Case, are you willing to accept this TOC?  Please advise.

## 2021-08-14 ENCOUNTER — Other Ambulatory Visit: Payer: Self-pay | Admitting: Medical

## 2021-08-14 NOTE — Telephone Encounter (Signed)
LMOVM asking pt to return call to me so that we can set up [facility] to female provider per her request, Hyman Hopes, FNP.

## 2021-08-25 ENCOUNTER — Other Ambulatory Visit: Payer: Self-pay | Admitting: Medical

## 2021-08-26 NOTE — Telephone Encounter (Addendum)
Requesting: tramadol & Klonopin  Contract:01/27/21 UDS:01/17/21 Last Visit:07/10/21 Next Visit:n/a Last Refill:08/12/21  Please Advise    I refilled patient's controlled medication but she needs to get scheduled with Lovena Le.  Patient had requested transfer to female provider.  Lovena Le excepted so pt needs to be scheduled.  Please call patient and get her scheduled with Lovena Le.

## 2021-08-27 ENCOUNTER — Telehealth: Payer: Self-pay | Admitting: Medical

## 2021-08-27 NOTE — Telephone Encounter (Signed)
I left a message for the patient on 08/15/21 to call office back to set up TOC appt with [facility].  Janet Case did agree to the transfer.

## 2021-08-27 NOTE — Telephone Encounter (Signed)
This pt wanted to switch to female provider. I think Ladona Ridgel accepted. Has she made appointment yet?

## 2021-08-28 NOTE — Telephone Encounter (Signed)
Chart opened to rx med, order lab, review chart, respond to my chart message or send message to staff member  

## 2021-08-28 NOTE — Telephone Encounter (Signed)
Sent pt a mychart message to call and schedule

## 2021-09-11 ENCOUNTER — Other Ambulatory Visit: Payer: Self-pay | Admitting: Medical

## 2021-10-06 ENCOUNTER — Other Ambulatory Visit: Payer: Self-pay | Admitting: Medical

## 2021-10-08 ENCOUNTER — Other Ambulatory Visit: Payer: Self-pay | Admitting: Medical

## 2021-10-09 ENCOUNTER — Other Ambulatory Visit: Payer: Self-pay | Admitting: Medical

## 2021-10-10 ENCOUNTER — Other Ambulatory Visit: Payer: Self-pay | Admitting: Medical

## 2021-10-10 ENCOUNTER — Telehealth: Payer: Self-pay | Admitting: Medical

## 2021-10-10 NOTE — Telephone Encounter (Addendum)
Requesting: klonopin  ?Contract:01/17/21 ?UDS:01/17/21 ?Last Visit:07/10/21 ?Next Visit:10/24/21 ?Last Refill:08/27/21 ? ?Please Advise  ?

## 2021-10-10 NOTE — Telephone Encounter (Signed)
I am getting refill request to fill meds for this pt. She had asked to switch to female provider about 1-2 months ago. I think she switched to [facility]. Will you check and have pt get pt in for established pt appointment. Let me know when she will get in with her. Please ?

## 2021-10-10 NOTE — Telephone Encounter (Signed)
Rerouted rx request to Janet Case , patient has an appt on 10/24/2021 to establish care with Janet Case ?

## 2021-10-13 NOTE — Telephone Encounter (Signed)
Requesting: klonopin  ?Contract:10/13/21 ?UDS:01/17/21 ?Last Visit:07/10/21 ?Next Visit:10/24/21 ?Last Refill:08/27/21 ?  ?

## 2021-10-13 NOTE — Telephone Encounter (Signed)
Wrong date enter by mistake 01/17/22 ?

## 2021-10-14 ENCOUNTER — Encounter: Payer: Self-pay | Admitting: Family Medicine

## 2021-10-15 ENCOUNTER — Ambulatory Visit: Payer: No Typology Code available for payment source | Admitting: Family Medicine

## 2021-10-21 ENCOUNTER — Other Ambulatory Visit: Payer: Self-pay | Admitting: Family Medicine

## 2021-10-22 ENCOUNTER — Other Ambulatory Visit: Payer: Self-pay | Admitting: Medical

## 2021-10-22 NOTE — Telephone Encounter (Signed)
Requesting: clonazepam and tramadol ?Contract:  ?UDS: 01/17/21 ?Last Visit:07/10/21  ?Next Visit: 10/30/21 ?Last Refill: 10/10/21 ? ?Please Advise ? ?

## 2021-10-24 ENCOUNTER — Ambulatory Visit: Payer: No Typology Code available for payment source | Admitting: Family Medicine

## 2021-10-27 ENCOUNTER — Encounter: Payer: Self-pay | Admitting: Family Medicine

## 2021-10-27 ENCOUNTER — Ambulatory Visit: Payer: No Typology Code available for payment source | Admitting: Family Medicine

## 2021-10-27 VITALS — BP 124/90 | HR 109 | Ht 64.0 in | Wt 223.6 lb

## 2021-10-27 DIAGNOSIS — F419 Anxiety disorder, unspecified: Secondary | ICD-10-CM

## 2021-10-27 DIAGNOSIS — F32A Depression, unspecified: Secondary | ICD-10-CM

## 2021-10-27 DIAGNOSIS — G5132 Clonic hemifacial spasm, left: Secondary | ICD-10-CM | POA: Diagnosis not present

## 2021-10-27 DIAGNOSIS — R748 Abnormal levels of other serum enzymes: Secondary | ICD-10-CM | POA: Diagnosis not present

## 2021-10-27 DIAGNOSIS — I10 Essential (primary) hypertension: Secondary | ICD-10-CM | POA: Diagnosis not present

## 2021-10-27 MED ORDER — TOPIRAMATE 50 MG PO TABS: 50.0000 mg | ORAL_TABLET | Freq: Two times a day (BID) | ORAL | 1 refills | Status: DC

## 2021-10-27 MED ORDER — SERTRALINE HCL 100 MG PO TABS: 100.0000 mg | ORAL_TABLET | Freq: Every day | ORAL | 1 refills | Status: DC

## 2021-10-27 MED ORDER — HYDROXYZINE PAMOATE 25 MG PO CAPS: 25.0000 mg | ORAL_CAPSULE | Freq: Three times a day (TID) | ORAL | 0 refills | Status: DC | PRN

## 2021-10-27 NOTE — Assessment & Plan Note (Signed)
Blood pressure is at goal for age and co-morbidities.  I recommend continuing amlodipine 10 mg daily, metoprolol 25 mg daily, Diovan 320/25 mg daily.   - BP goal <130/80 - monitor and log blood pressures at home - check around the same time each day in a relaxed setting - Limit salt to <2000 mg/day - Follow DASH eating plan (heart healthy diet) - limit alcohol to 2 standard drinks per day for men and 1 per day for women - avoid tobacco products - get at least 2 hours of regular aerobic exercise weekly Patient aware of signs/symptoms requiring further/urgent evaluation. Labs updated today.   

## 2021-10-27 NOTE — Assessment & Plan Note (Signed)
Will update FMLA paperwork for the next 3 months to allow for 4 episodes of intermittent leave per month, each episode lasting 1-2 days - hopefully this time she will be able to find a new job or have better control over anxiety with meds/counseling. Thorough discussion on safety of medications and goal of eliminating benzos. Patient is agreeable. Increasing Zoloft to 100 mg daily and adding hydroxyzine. States that she has plenty of leftover klonopin based on the way it was being refilled - encouraged her to start weaning and saving for panic attacks only with the goal of not having to refill this medication (not to be taken with other sleeping aids). Referral to counseling placed. No SI/HI. Patient aware of signs/symptoms requiring further/urgent evaluation.  ?

## 2021-10-27 NOTE — Patient Instructions (Addendum)
Start weaning clonazepam. Save what you have for extreme anxiety/panic attacks.  ?Increase the Zoloft to 100 mg daily - take early in the day so it doesn't worsen insomnia. Start as needed hydroxyzine instead of the clonazepam.  ?

## 2021-10-27 NOTE — Progress Notes (Signed)
? ?______________________________________________________________________ ? ?HPI ?Janet Case is a 61 y.o. female presenting [facility] today to establish care; [facility] [place].  ? ?Patient Care Team: ?Clayborne DanaBeck, Broghan Pannone B, NP as PCP - General (Family Medicine) ? ?Health Maintenance  ?Topic Date Due  ? COVID-19 Vaccine (1) Never done  ? Zoster (Shingles) Vaccine (1 of 2) Never done  ? Colon Cancer Screening  Never done  ? Pap Smear  05/30/2018  ? Flu Shot  02/10/2022  ? Mammogram  09/15/2022  ? Tetanus Vaccine  05/07/2025  ? Hepatitis C Screening: USPSTF Recommendation to screen - Ages 3718-79 yo.  Completed  ? HIV Screening  Completed  ? HPV Vaccine  Aged Out  ? ? ? ?Concerns today:  ? ?Anxiety: Patient reports she has been struggling with anxiety for a while however the past several months have been most severe.  FMLA paperwork was filled out in December by prior PCP enabling her to be out of work 2-3 times per month, up to 3 days per episode.  Reports that March was rough and she ended up being out 8 times and her employer said she would have to come redo her paperwork to cover for the extra days missed.  States that she works in a very stressful job for [employer].  She has trouble relaxing, occasional difficulty sleeping.  She is hoping to start looking for a different job soon.  Currently on Zoloft 50 mg daily and states that this seems to be working okay, no side effects, but she is not sure how much it is helping.  She has been prescribed Klonopin, but reports she only takes it occasionally at night (typically 4 nights per week on average) and has not had to be using it during the day.  She denies any panic attacks.  No SI/HI.  She is open to counseling. ? ? ?HTN: Reports she is taking her medications as prescribed (amlodipine 10 mg daily, metoprolol 25 mg daily, Diovan 320/25 mg daily).  She denies any side effects, chest pain, palpitations, dyspnea, wheezing,  edema, recurrent headaches, vision changes.  States she does not monitor her blood pressure regularly at home. ? 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  She has been seen many years ago for this and has been managed with Topamax which is working well.  No new concerns. ?  ? ? ?Patient Active Problem List  ? Diagnosis Date Noted  ? Subacromial bursitis of left shoulder joint 10/26/2018  ? Paraspinal muscle spasm 10/26/2018  ? Chronic left shoulder pain 10/26/2018  ? Hemifacial spasm 02/03/2016  ? Thyroid nodule 02/03/2016  ? Obstructive sleep apnea 01/25/2016  ? Anxiety and depression 09/21/2015  ? Cystocele 05/31/2015  ? Rectocele 05/31/2015  ? Physical exam 05/08/2015  ? Essential hypertension 04/12/2015  ? Facial twitching 04/12/2015  ? Insomnia 04/12/2015  ? 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 Family history of breast cancer 04/12/2015  ? ? ?PHQ9 Today: ? ?  10/27/2021  ?  2:53 PM 01/17/2021  ?  1:05 PM 06/10/2017  ?  6:18 PM  ?Depression screen PHQ 2/9  ?Decreased Interest 3 1 0  ?Down, Depressed, Hopeless 3 1 0  ?PHQ - 2 Score 6 2 0  ?Altered sleeping 3 3   ?Tired, decreased energy 3 1   ?Change in appetite 3 3   ?Feeling bad or failure about yourself  2 2   ?Trouble concentrating 2 2   ?  Moving slowly or fidgety/restless 1 0   ?Suicidal thoughts 0 0   ?PHQ-9 Score 20 13   ?Difficult doing work/chores Somewhat difficult Somewhat difficult   ? ?GAD7 Today: ? ?  10/27/2021  ?  2:53 PM  ?GAD 7 : Generalized Anxiety Score  ?Nervous, Anxious, on Edge 3  ?Control/stop worrying 3  ?Worry too much - different things 2  ?Trouble relaxing 3  ?Restless 2  ?Easily annoyed or irritable 3  ?Afraid - awful might happen 0  ?Total GAD 7 Score 16  ?Anxiety Difficulty Somewhat difficult  ? ?______________________________________________________________________ ?PMH ?Past Medical History:  ?Diagnosis Date  ? Anxiety   ? Clonic hemifacial spasm   ? Left Facial  ? Colon polyps   ? Depression   ?  Hypertension   ? Muscle spasms of head and/or neck   ? ? ?ROS ?All review of systems negative except what is listed in the HPI ? ?PHYSICAL EXAM ?Physical Exam ?Vitals reviewed.  ?Constitutional:   ?   Appearance: Normal appearance.  ?HENT:  ?   Head:  ?   Comments: Mild left facial droop, eye twitch/spasm ?Cardiovascular:  ?   Rate and Rhythm: Normal rate and regular rhythm.  ?Pulmonary:  ?   Effort: Pulmonary effort is normal.  ?   Breath sounds: Normal breath sounds.  ?Musculoskeletal:  ?   Cervical back: Normal range of motion and neck supple.  ?   Right lower leg: No edema.  ?   Left lower leg: No edema.  ?Skin: ?   General: Skin is warm and dry.  ?Neurological:  ?   General: No focal deficit present.  ?   Mental Status: She is alert and oriented to person, place, and time. Mental status is at baseline.  ?Psychiatric:     ?   Mood and Affect: Mood normal.     ?   Behavior: Behavior normal.     ?   Thought Content: Thought content normal.     ?   Judgment: Judgment normal.  ? ?______________________________________________________________________ ?ASSESSMENT AND PLAN ?Problem List Items Addressed This Visit   ? ?  ? Cardiovascular and Mediastinum  ? Essential hypertension - Primary  ?  Blood pressure is at goal for age and co-morbidities.  I recommend continuing amlodipine 10 mg daily, metoprolol 25 mg daily, Diovan 320/25 mg daily.   ?- BP goal <130/80 ?- monitor and log blood pressures at home ?- check around the same time each day in a relaxed setting ?- Limit salt to <2000 mg/day ?- Follow DASH eating plan (heart healthy diet) ?- limit alcohol to 2 standard drinks per day for men and 1 per day for women ?- avoid tobacco products ?- get at least 2 hours of regular aerobic exercise weekly ?Patient aware of signs/symptoms requiring further/urgent evaluation. ?Labs updated today. ? ? ?  ?  ? Relevant Orders  ? TSH  ? Comprehensive metabolic panel  ? CBC  ?  ? Other  ? Anxiety and depression  ?  Will update FMLA  paperwork for the next 3 months to allow for 4 episodes of intermittent leave per month, each episode lasting 1-2 days - hopefully this time she will be able to find a new job or have better control over anxiety with meds/counseling. Thorough discussion on safety of medications and goal of eliminating benzos. Patient is agreeable. Increasing Zoloft to 100 mg daily and adding hydroxyzine. States that she has plenty of leftover klonopin based on the  way it was being refilled - encouraged her to start weaning and saving for panic attacks only with the goal of not having to refill this medication (not to be taken with other sleeping aids). Referral to counseling placed. No SI/HI. Patient aware of signs/symptoms requiring further/urgent evaluation.  ? ?  ?  ? Relevant Medications  ? sertraline (ZOLOFT) 100 MG tablet  ? hydrOXYzine (VISTARIL) 25 MG capsule  ? Other Relevant Orders  ? Ambulatory referral to Behavioral Health  ? Hemifacial spasm  ?  Stable. No new concerns. Refilling Topamax.  ? ?  ?  ? Relevant Medications  ? topiramate (TOPAMAX) 50 MG tablet  ? ?Establish care  ?Education provided today during visit and on AVS for patient to review at home.  ?Diet and Exercise recommendations provided.  ?Current diagnoses and recommendations discussed. ?HM recommendations reviewed with recommendations.  ? ? ?Outpatient Encounter Medications as of 10/27/2021  ?Medication Sig  ? clonazePAM (KLONOPIN) 0.5 MG tablet TAKE 1 TABLET BY MOUTH TWICE A DAY AS NEEDED FOR ANXIETY  ? hydrOXYzine (VISTARIL) 25 MG capsule Take 1 capsule (25 mg total) by mouth every 8 (eight) hours as needed.  ? sertraline (ZOLOFT) 100 MG tablet Take 1 tablet (100 mg total) by mouth daily.  ? amLODipine (NORVASC) 10 MG tablet TAKE 1 TABLET BY MOUTH EVERY DAY  ? meloxicam (MOBIC) 7.5 MG tablet TAKE 1 TABLET BY MOUTH EVERY DAY  ? metoprolol succinate (TOPROL-XL) 25 MG 24 hr tablet TAKE 1 TABLET (25 MG TOTAL) BY MOUTH DAILY.  ? rosuvastatin (CRESTOR) 5 MG  tablet TAKE 1 TABLET (5 MG TOTAL) BY MOUTH DAILY.  ? topiramate (TOPAMAX) 50 MG tablet Take 1 tablet (50 mg total) by mouth 2 (two) times daily.  ? valsartan-hydrochlorothiazide (DIOVAN-HCT) 320-25 MG ta

## 2021-10-27 NOTE — Assessment & Plan Note (Signed)
Stable. No new concerns. Refilling Topamax.  ?

## 2021-10-28 ENCOUNTER — Other Ambulatory Visit: Payer: Self-pay | Admitting: Family Medicine

## 2021-10-28 LAB — COMPREHENSIVE METABOLIC PANEL
ALT: 13 U/L (ref 0–35)
AST: 16 U/L (ref 0–37)
Albumin: 4.6 g/dL (ref 3.5–5.2)
Alkaline Phosphatase: 163 U/L — ABNORMAL HIGH (ref 39–117)
BUN: 10 mg/dL (ref 6–23)
CO2: 27 mEq/L (ref 19–32)
Calcium: 9.6 mg/dL (ref 8.4–10.5)
Chloride: 105 mEq/L (ref 96–112)
Creatinine, Ser: 0.92 mg/dL (ref 0.40–1.20)
GFR: 66.58 mL/min (ref >60.00)
Glucose, Bld: 113 mg/dL — ABNORMAL HIGH (ref 70–99)
Potassium: 3.6 mEq/L (ref 3.5–5.1)
Sodium: 140 mEq/L (ref 135–145)
Total Bilirubin: 0.5 mg/dL (ref 0.2–1.2)
Total Protein: 7.5 g/dL (ref 6.0–8.3)

## 2021-10-28 LAB — CBC
HCT: 47.2 % — ABNORMAL HIGH (ref 36.0–46.0)
Hemoglobin: 15.5 g/dL — ABNORMAL HIGH (ref 12.0–15.0)
MCHC: 32.8 g/dL (ref 30.0–36.0)
MCV: 89.2 fl (ref 78.0–100.0)
Platelets: 265 10*3/uL (ref 150.0–400.0)
RBC: 5.3 Mil/uL — ABNORMAL HIGH (ref 3.87–5.11)
RDW: 14.7 % (ref 11.5–15.5)
WBC: 6.2 10*3/uL (ref 4.0–10.5)

## 2021-10-28 LAB — TSH: TSH: 1.65 u[IU]/mL (ref 0.35–5.50)

## 2021-10-28 NOTE — Addendum Note (Signed)
Addended by: Hyman Hopes B on: 10/28/2021 01:25 PM ? ? Modules accepted: Orders ? ?

## 2021-10-29 ENCOUNTER — Encounter: Payer: Self-pay | Admitting: Family Medicine

## 2021-10-30 ENCOUNTER — Ambulatory Visit: Payer: No Typology Code available for payment source | Admitting: Family Medicine

## 2021-11-09 ENCOUNTER — Other Ambulatory Visit: Payer: Self-pay | Admitting: Family Medicine

## 2021-11-09 ENCOUNTER — Other Ambulatory Visit: Payer: Self-pay | Admitting: Medical

## 2021-11-09 DIAGNOSIS — F419 Anxiety disorder, unspecified: Secondary | ICD-10-CM

## 2021-11-17 ENCOUNTER — Encounter: Payer: Self-pay | Admitting: Family Medicine

## 2021-11-23 ENCOUNTER — Other Ambulatory Visit: Payer: Self-pay | Admitting: Family Medicine

## 2021-11-23 ENCOUNTER — Other Ambulatory Visit: Payer: Self-pay | Admitting: Medical

## 2021-11-23 DIAGNOSIS — G5132 Clonic hemifacial spasm, left: Secondary | ICD-10-CM

## 2021-11-23 DIAGNOSIS — F419 Anxiety disorder, unspecified: Secondary | ICD-10-CM

## 2021-11-24 ENCOUNTER — Other Ambulatory Visit: Payer: Self-pay | Admitting: Family Medicine

## 2021-11-24 DIAGNOSIS — G5132 Clonic hemifacial spasm, left: Secondary | ICD-10-CM

## 2021-12-05 ENCOUNTER — Other Ambulatory Visit: Payer: Self-pay | Admitting: Family Medicine

## 2021-12-05 NOTE — Telephone Encounter (Signed)
Requesting: zaleplon 10mg   Contract: 01/17/21 UDS: 01/17/21 Last Visit: 10/27/21 Next Visit: None Last Refill: 11/10/21 #10 and 0RF  Please Advise

## 2021-12-29 ENCOUNTER — Other Ambulatory Visit: Payer: Self-pay | Admitting: Medical

## 2021-12-29 ENCOUNTER — Other Ambulatory Visit: Payer: Self-pay | Admitting: Family Medicine

## 2021-12-29 DIAGNOSIS — F419 Anxiety disorder, unspecified: Secondary | ICD-10-CM

## 2021-12-29 MED ORDER — HYDROXYZINE PAMOATE 25 MG PO CAPS: 25.0000 mg | ORAL_CAPSULE | Freq: Three times a day (TID) | ORAL | 0 refills | Status: DC | PRN

## 2021-12-31 MED ORDER — ZALEPLON 10 MG PO CAPS: ORAL_CAPSULE | ORAL | 0 refills | Status: DC

## 2022-01-17 ENCOUNTER — Encounter: Payer: Self-pay | Admitting: Family Medicine

## 2022-01-19 ENCOUNTER — Other Ambulatory Visit: Payer: Self-pay | Admitting: Medical

## 2022-01-23 ENCOUNTER — Ambulatory Visit: Payer: No Typology Code available for payment source | Admitting: Family Medicine

## 2022-01-23 ENCOUNTER — Encounter: Payer: Self-pay | Admitting: Family Medicine

## 2022-01-23 VITALS — BP 117/87 | HR 74 | Ht 64.0 in | Wt 216.6 lb

## 2022-01-23 DIAGNOSIS — G4700 Insomnia, unspecified: Secondary | ICD-10-CM | POA: Diagnosis not present

## 2022-01-23 DIAGNOSIS — F419 Anxiety disorder, unspecified: Secondary | ICD-10-CM

## 2022-01-23 DIAGNOSIS — F32A Depression, unspecified: Secondary | ICD-10-CM | POA: Diagnosis not present

## 2022-01-23 MED ORDER — TRAZODONE HCL 50 MG PO TABS: 25.0000 mg | ORAL_TABLET | Freq: Every evening | ORAL | 0 refills | Status: DC | PRN

## 2022-01-23 NOTE — Assessment & Plan Note (Signed)
Updated accomodation paperwork. She is planning to follow-up with the [facility] group that left her messages - I also asked our referral coordinator which group it was so we can give her the number. Advised her that I cannot keep writing her out of work for anxiety without her seeing a specialist.  No SI/HI.

## 2022-01-23 NOTE — Assessment & Plan Note (Signed)
She prefers to switch to trazodone. Discontinued the sonata. Educated on safety and sleep hygiene.

## 2022-01-23 NOTE — Progress Notes (Signed)
   Acute Office Visit  Subjective:     Patient ID: Janet Case, female    DOB: Oct 19, 1960, 61 y.o.   MRN: [id number]  CC: paperwork   HPI Patient is in today for paperwork.  She has used up all of her FMLA for now. She needs form for reasonable accommodations to let her be able to be out occasionally for anxiety and for appointments until she starts accruing FMLA again. Requesting the same accomodation for intermittent leave as previously done.   She has not yet followed up with behavioral health - referral placed a few months ago, but she did not call them back. She is aware that I cannot keep writing her out of work without her seeing a specialist. She will call them soon to get scheduled.   No SI/HI.    Insomnia - She does not feel like the Kathaleen Bury is doing much. She would like to try trazodone instead.    ROS All review of systems negative except what is listed in the HPI      Objective:    BP 117/87   Pulse 74   Ht 5\' 4"  (1.626 m)   Wt 216 lb 9.6 oz (98.2 kg)   BMI 37.18 kg/m    Physical Exam Vitals reviewed.  Constitutional:      General: She is not in acute distress.    Appearance: Normal appearance. She is obese. She is not ill-appearing.  Neurological:     General: No focal deficit present.     Mental Status: She is alert and oriented to person, place, and time. Mental status is at baseline.  Psychiatric:        Mood and Affect: Mood normal.        Behavior: Behavior normal.        Thought Content: Thought content normal.        Judgment: Judgment normal.        No results found for any visits on 01/23/22.      Assessment & Plan:   Problem List Items Addressed This Visit       Other   Insomnia    She prefers to switch to trazodone. Discontinued the sonata. Educated on safety and sleep hygiene.       Relevant Medications   traZODone (DESYREL) 50 MG tablet   Anxiety and depression - Primary    Updated accomodation paperwork. She is  planning to follow-up with the [facility] group that left her messages - I also asked our referral coordinator which group it was so we can give her the number. Advised her that I cannot keep writing her out of work for anxiety without her seeing a specialist.  No SI/HI.        Relevant Medications   traZODone (DESYREL) 50 MG tablet    Meds ordered this encounter  Medications   traZODone (DESYREL) 50 MG tablet    Sig: Take 0.5-1 tablets (25-50 mg total) by mouth at bedtime as needed for sleep.    Dispense:  30 tablet    Refill:  0    Order Specific Question:   Supervising Provider    Answer:   [facility] [[id number]]    Return if symptoms worsen or fail to improve, for (due for routine f/u in October).  November, NP

## 2022-01-26 ENCOUNTER — Other Ambulatory Visit: Payer: Self-pay | Admitting: Family Medicine

## 2022-02-06 ENCOUNTER — Other Ambulatory Visit: Payer: Self-pay | Admitting: Medical

## 2022-02-12 ENCOUNTER — Ambulatory Visit (INDEPENDENT_AMBULATORY_CARE_PROVIDER_SITE_OTHER): Payer: No Typology Code available for payment source | Admitting: Behavioral Health

## 2022-02-12 DIAGNOSIS — F419 Anxiety disorder, unspecified: Secondary | ICD-10-CM | POA: Diagnosis not present

## 2022-02-12 DIAGNOSIS — F32A Depression, unspecified: Secondary | ICD-10-CM | POA: Diagnosis not present

## 2022-02-12 NOTE — Progress Notes (Addendum)
[facility] Counselor Initial Adult Exam  Name: Janet Case Date: 02/12/2022 MRN: [id number] DOB: 07-25-1960 PCP: Clayborne Dana, NP  Time spent: 40 min from 9:20a-10:00a Webex video; Pt is home in private/Clinician is @ [facility]  Guardian/Payee:  Self    Paperwork requested: Yes  Reason for Visit /Presenting Problem: work stress & life circumstances that contribute to feelings of overwhelm  Anx/Dep  Mental Status Exam: Appearance:   NA     Behavior:  Appropriate  Motor:  NA  Speech/Language:   Normal Rate  Affect:  Appropriate  Mood:  normal  Thought process:  normal  Thought content:    WNL  Sensory/Perceptual disturbances:    WNL  Orientation:  oriented to person, place, and time/date  Attention:  Good  Concentration:  Good  Memory:  WNL  Fund of knowledge:   Good  Insight:    Good  Judgment:   Good  Impulse Control:  Good   Appearance: NA    Behavior: Appropriate Motor:  NA Speech/Language: Normal Rate Affect: Appropriate Mood: normal Thought process: normal Thought content: WNL Sensory/Perceptual disturbances: WNL Orientation: oriented to person, place, and time/date Attention: Good Concentration: Good Memory: WNL Fund of knowledge: Good Insight: Good Judgment: Good Impulse Control: Good  Reported Symptoms:  anx/dep & work stressors  Risk Assessment: Danger to Self:  No Self-injurious Behavior: No Danger to Others: No Duty to Warn:no Physical Aggression / Violence:No  Access to Firearms a concern: No  Gang Involvement:No  Patient / guardian was educated about steps to take if suicide or homicide risk level increases between visits: no While future psychiatric events cannot be accurately predicted, the patient does not currently require acute inpatient psychiatric care and does not currently meet [facility] involuntary commitment criteria.  Substance Abuse History: Current substance abuse: No     Past Psychiatric History:    No previous psychological problems have been observed Outpatient Providers: Dr. Hyman Hopes, NP @ [facility] [address] location History of Psych Hospitalization: No  Psychological Testing:  NA    Abuse History:  Victim of: No.,  NA    Report needed: No. Victim of Neglect:No. Perpetrator of  NA   Witness / Exposure to Domestic Violence: No   Protective Services Involvement: No  Witness to [employer] Violence:  No   Family History:  Family History  Problem Relation Age of Onset   Hyperlipidemia Mother 47       Deceased   Heart disease Mother    Congestive Heart Failure Mother    Hypertension Mother    Kidney disease Mother    Diabetes Mother    Dementia Mother    Heart defect Mother    Kidney disease Father 15       Deceased   Heart attack Father    Hypertension Sister    Diabetes Sister    Breast cancer Sister    Depression Sister    Anxiety disorder Sister    Thyroid cancer Sister        Thyroid Removed   Leukemia Maternal Aunt    Allergies Daughter    GI problems Daughter     Living situation: the patient lives alone  Sexual Orientation: Straight  Relationship Status: divorced  Name of spouse / other:  NA If a parent, number of children / ages: 2 Adult Dtrs; 8yo Tameka who lives 10 min away & 63yo [employer] w/63yo Graduate [employer]  Support Systems: NA  Financial Stress:  Not noted today  Income/Employment/Disability: 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 Pt  has used up her Intermittent FMLA & may find scheduling a challenge in the future. Pt prefers Webex.   Legal History: Pending legal issue / charges: The patient has no significant history of legal issues. History of legal issue / charges:  NA  Medical History/Surgical History: reviewed Past Medical History:  Diagnosis Date   Anxiety    Clonic hemifacial spasm    Left Facial   Colon polyps    Depression    Hypertension    Muscle spasms of head and/or neck     Past Surgical History:  Procedure Laterality Date   POLYPECTOMY     Oral   TUBAL LIGATION     VAGINAL WOUND CLOSURE / REPAIR  1977   WISDOM TOOTH EXTRACTION      Medications: Current Outpatient Medications  Medication Sig Dispense Refill   clonazePAM (KLONOPIN) 0.5 MG tablet TAKE 1 TABLET BY MOUTH TWICE A DAY AS NEEDED FOR ANXIETY 15 tablet 0   amLODipine (NORVASC) 10 MG tablet TAKE 1 TABLET BY MOUTH EVERY DAY 90 tablet 1   hydrOXYzine (VISTARIL) 25 MG capsule Take 1 capsule (25 mg total) by mouth every 8 (eight) hours as needed. 30 capsule 0   meloxicam (MOBIC) 7.5 MG tablet TAKE 1 TABLET BY MOUTH EVERY DAY 30 tablet 2   metoprolol succinate (TOPROL-XL) 25 MG 24 hr tablet TAKE 1 TABLET (25 MG TOTAL) BY MOUTH DAILY. 90 tablet 3   rosuvastatin (CRESTOR) 5 MG tablet TAKE 1 TABLET (5 MG TOTAL) BY MOUTH DAILY. 90 tablet 1   sertraline (ZOLOFT) 100 MG tablet Take 1 tablet (100 mg total) by mouth daily. 90 tablet 1   topiramate (TOPAMAX) 50 MG tablet TAKE 1 TABLET BY MOUTH TWICE A DAY 90 tablet 1   traZODone (DESYREL) 50 MG tablet Take 0.5-1 tablets (25-50 mg total) by mouth at bedtime as needed for sleep. 30 tablet 0   valsartan-hydrochlorothiazide (DIOVAN-HCT) 320-25 MG tablet TAKE 1 TABLET BY MOUTH EVERY DAY 90 tablet 0   No current facility-administered medications for this visit.    Allergies  Allergen Reactions   Influenza Vaccines Nausea Only    Diagnoses:  No diagnosis found.  Plan of Care: Pt will send ppw  for reduction in work hours to Clinician for review; Pt is aware Clinician will not be able to endorse immediately   Deneise Lever, LMFT

## 2022-02-12 NOTE — Progress Notes (Signed)
                [name] [name], LMFT 

## 2022-02-14 ENCOUNTER — Other Ambulatory Visit: Payer: Self-pay | Admitting: Family Medicine

## 2022-02-14 DIAGNOSIS — G4700 Insomnia, unspecified: Secondary | ICD-10-CM

## 2022-02-16 NOTE — Telephone Encounter (Signed)
Okay to send the 90 rx

## 2022-02-19 ENCOUNTER — Other Ambulatory Visit: Payer: Self-pay | Admitting: Medical

## 2022-02-21 ENCOUNTER — Other Ambulatory Visit: Payer: Self-pay | Admitting: Family Medicine

## 2022-02-21 DIAGNOSIS — G5132 Clonic hemifacial spasm, left: Secondary | ICD-10-CM

## 2022-02-26 ENCOUNTER — Ambulatory Visit (INDEPENDENT_AMBULATORY_CARE_PROVIDER_SITE_OTHER): Payer: No Typology Code available for payment source | Admitting: Behavioral Health

## 2022-02-26 DIAGNOSIS — F419 Anxiety disorder, unspecified: Secondary | ICD-10-CM

## 2022-02-26 DIAGNOSIS — F32A Depression, unspecified: Secondary | ICD-10-CM | POA: Diagnosis not present

## 2022-02-26 NOTE — Progress Notes (Signed)
                Janet Case Janet Jarrid Lienhard, LMFT 

## 2022-02-26 NOTE — Progress Notes (Addendum)
[facility] Counselor/Therapist Progress Note  Patient ID: Janet Case, MRN: [id number],    Date: 02/26/2022  Time Spent: 55 min Caregility video; Pt is home in private & Provider @  [facility]  Treatment Type: Individual Therapy  Reported Symptoms: Reduction in anx/dep due to inc'd & improved sleep hygiene. Pt is handling work stressors in better manner since her sleep improvements are, "in process".   Mental Status Exam: Appearance:  NA     Behavior: Appropriate  Motor: Normal  Speech/Language:  Normal Rate  Affect: Appropriate  Mood: normal  Thought process: normal  Thought content:   WNL  Sensory/Perceptual disturbances:   WNL  Orientation: oriented to person, place, and time/date  Attention: Good  Concentration: Good  Memory: WNL  Fund of knowledge:  Good  Insight:   Good  Judgment:  Good  Impulse Control: Good   Risk Assessment: Danger to Self:  No Self-injurious Behavior: No Danger to Others: No Duty to Warn:no Physical Aggression / Violence:No  Access to Firearms a concern: No  Gang Involvement:No   Subjective: Pt feels her sleep is improving & she is, "really trying" hard to get it adequate for her needs. Her anx/dep w/work situation is also going more smoothly. She has dealt w/several issues in a more positive way in the last 2 wks. Her attitude is improving re: her own self-esteem & confidence.   Interventions: Cognitive Behavioral Therapy and Assertiveness/Communication  Diagnosis: Anxiety & Depression  Plan: ST goals: Pt c/o poor sleep quality. She will cont to use the Sleep Hygiene pdf for assistance w/these efforts. We will review @ next visit in about 2 wks.  LT goals: Pt c/o not feeling heard by [employer] @ work. Cont to make sound decisions @ work w/Supervisor & Co-Workers. Report back in 2 wks how this has improved exp @ work & w/Co-Workers.  Deneise Lever, LMFT

## 2022-03-06 ENCOUNTER — Other Ambulatory Visit: Payer: Self-pay | Admitting: Family Medicine

## 2022-03-06 DIAGNOSIS — F32A Depression, unspecified: Secondary | ICD-10-CM

## 2022-03-06 DIAGNOSIS — F419 Anxiety disorder, unspecified: Secondary | ICD-10-CM

## 2022-03-12 ENCOUNTER — Ambulatory Visit: Payer: No Typology Code available for payment source | Admitting: Behavioral Health

## 2022-03-19 ENCOUNTER — Other Ambulatory Visit: Payer: Self-pay | Admitting: Medical

## 2022-03-20 ENCOUNTER — Ambulatory Visit: Payer: No Typology Code available for payment source | Admitting: Behavioral Health

## 2022-03-20 NOTE — Progress Notes (Incomplete)
                Velda Wendt L Jorah Hua, LMFT 

## 2022-03-24 ENCOUNTER — Encounter: Payer: Self-pay | Admitting: Family Medicine

## 2022-03-27 ENCOUNTER — Other Ambulatory Visit: Payer: Self-pay | Admitting: Family Medicine

## 2022-03-27 DIAGNOSIS — F419 Anxiety disorder, unspecified: Secondary | ICD-10-CM

## 2022-03-30 ENCOUNTER — Telehealth: Payer: Self-pay | Admitting: Behavioral Health

## 2022-03-30 ENCOUNTER — Other Ambulatory Visit: Payer: Self-pay | Admitting: Family Medicine

## 2022-03-30 NOTE — Telephone Encounter (Signed)
Unable to reach Pt for today's telehealth video visit. Lft msg for Pt to call [facility] & r/s @ her convenience.   Dr. Theodis Shove

## 2022-04-02 ENCOUNTER — Other Ambulatory Visit: Payer: Self-pay | Admitting: Family Medicine

## 2022-04-02 ENCOUNTER — Other Ambulatory Visit: Payer: Self-pay | Admitting: Medical

## 2022-04-02 NOTE — Telephone Encounter (Signed)
/  Requesting: klonopin /Contract:01/17/2021 UDS:01/17/2021 Last Visit:01/23/2022 Next Visit:n/a Last Refill:10/23/2021  Please Advise

## 2022-04-08 ENCOUNTER — Other Ambulatory Visit: Payer: Self-pay | Admitting: Family Medicine

## 2022-04-08 DIAGNOSIS — F32A Anxiety disorder, unspecified: Secondary | ICD-10-CM

## 2022-04-09 ENCOUNTER — Other Ambulatory Visit: Payer: Self-pay | Admitting: Family Medicine

## 2022-04-09 DIAGNOSIS — F419 Anxiety disorder, unspecified: Secondary | ICD-10-CM

## 2022-04-16 ENCOUNTER — Other Ambulatory Visit: Payer: Self-pay | Admitting: Family Medicine

## 2022-04-16 NOTE — Telephone Encounter (Signed)
Requesting: clonazepam 0.5mg   Contract: 01/17/21 UDS: 01/17/21  Last Visit: 01/23/22 Next Visit: None Last Refill: 04/02/22 #15 and 0RF   Please Advise

## 2022-04-22 ENCOUNTER — Other Ambulatory Visit: Payer: Self-pay | Admitting: Family Medicine

## 2022-04-22 DIAGNOSIS — G4700 Insomnia, unspecified: Secondary | ICD-10-CM

## 2022-04-22 DIAGNOSIS — F419 Anxiety disorder, unspecified: Secondary | ICD-10-CM

## 2022-04-24 ENCOUNTER — Other Ambulatory Visit: Payer: Self-pay | Admitting: Family Medicine

## 2022-04-24 DIAGNOSIS — F32A Depression, unspecified: Secondary | ICD-10-CM

## 2022-04-28 ENCOUNTER — Other Ambulatory Visit: Payer: Self-pay | Admitting: Family Medicine

## 2022-04-28 DIAGNOSIS — F32A Depression, unspecified: Secondary | ICD-10-CM

## 2022-05-06 ENCOUNTER — Other Ambulatory Visit: Payer: Self-pay | Admitting: Family

## 2022-05-06 ENCOUNTER — Other Ambulatory Visit: Payer: Self-pay | Admitting: Family Medicine

## 2022-05-09 ENCOUNTER — Other Ambulatory Visit: Payer: Self-pay | Admitting: Family Medicine

## 2022-05-09 DIAGNOSIS — F32A Depression, unspecified: Secondary | ICD-10-CM

## 2022-05-26 ENCOUNTER — Other Ambulatory Visit: Payer: Self-pay | Admitting: Family Medicine

## 2022-05-26 DIAGNOSIS — F419 Anxiety disorder, unspecified: Secondary | ICD-10-CM

## 2022-06-05 ENCOUNTER — Other Ambulatory Visit: Payer: Self-pay | Admitting: Family Medicine

## 2022-06-05 DIAGNOSIS — F32A Depression, unspecified: Secondary | ICD-10-CM

## 2022-06-06 ENCOUNTER — Other Ambulatory Visit: Payer: Self-pay | Admitting: Family Medicine

## 2022-06-12 ENCOUNTER — Encounter: Payer: Self-pay | Admitting: Family Medicine

## 2022-06-27 ENCOUNTER — Other Ambulatory Visit: Payer: Self-pay | Admitting: Family Medicine

## 2022-06-29 ENCOUNTER — Other Ambulatory Visit: Payer: Self-pay | Admitting: Medical

## 2022-06-29 NOTE — Telephone Encounter (Signed)
Requesting: clonazepam 0.5mg  Contract: 06/07/18 UDS: 01/17/21 Last Visit: 01/23/22 Next Visit: 07/01/22 Last Refill: 04/16/22  Please Advise

## 2022-07-01 ENCOUNTER — Encounter: Payer: Self-pay | Admitting: Family Medicine

## 2022-07-01 ENCOUNTER — Ambulatory Visit: Payer: No Typology Code available for payment source | Admitting: Family Medicine

## 2022-07-01 VITALS — BP 120/85 | HR 82 | Temp 98.0°F | Resp 16 | Ht 64.0 in | Wt 220.0 lb

## 2022-07-01 DIAGNOSIS — I10 Essential (primary) hypertension: Secondary | ICD-10-CM | POA: Diagnosis not present

## 2022-07-01 DIAGNOSIS — G4733 Obstructive sleep apnea (adult) (pediatric): Secondary | ICD-10-CM | POA: Diagnosis not present

## 2022-07-01 DIAGNOSIS — F419 Anxiety disorder, unspecified: Secondary | ICD-10-CM

## 2022-07-01 DIAGNOSIS — R739 Hyperglycemia, unspecified: Secondary | ICD-10-CM

## 2022-07-01 DIAGNOSIS — F32A Depression, unspecified: Secondary | ICD-10-CM

## 2022-07-01 DIAGNOSIS — G5132 Clonic hemifacial spasm, left: Secondary | ICD-10-CM

## 2022-07-01 DIAGNOSIS — R944 Abnormal results of kidney function studies: Secondary | ICD-10-CM

## 2022-07-01 DIAGNOSIS — K122 Cellulitis and abscess of mouth: Secondary | ICD-10-CM

## 2022-07-01 MED ORDER — AMOXICILLIN-POT CLAVULANATE 875-125 MG PO TABS: 1.0000 | ORAL_TABLET | Freq: Two times a day (BID) | ORAL | 0 refills | Status: AC

## 2022-07-01 NOTE — Progress Notes (Signed)
Established Patient Office Visit  Subjective   Patient ID: Janet Case, female    DOB: 1961/04/26  Age: 61 y.o. MRN: [id number]  Chief Complaint  Patient presents with   Follow-up    HPI   HYPERTENSION: - Medications: amlodipine 10 mg daily, metoprolol 25 mg daily, valsartan-hctz 320-25 mg daily - Compliance: good - Denies any SOB, recurrent headaches, CP, vision changes, LE edema, dizziness, palpitations, or medication side effects.   HYPERGLYCEMIA: - Medications: none, lifestyle measures only  - Compliance: fair - Denies symptoms of hypoglycemia, polyuria, polydipsia, numbness extremities, foot ulcers/trauma, wounds that are not healing, medication side effects    ANXIETY & DEPRESSION: - Medications: klonopin 0.5 mg daily PRN, hydroxyzine 25 mg PRN, Zoloft 100 mg daily - Counseling/therapy: [facility] - Current SI/HI: none - Current symptoms: trouble sleeping, stressing occasionally, increased irritability  - would like referral for med management    OSA & INSOMNIA: - Reports it has been awhile since she had a sleep study (2017 mild OSA, conservative measures only) - occasionally feels tired during the day - daughter tells her she has been snoring but has not mentioned any apnea events - Trazodone helps her fall asleep, but she isn't staying asleep (wakes up at least every 2 hours or so) - has been working hard on sleep hygiene but still watches shows on her phone at night   HEMIFACIAL MUSCLE SPASMS: - Patient has seen Neuro in the past with MRIs completed - She now feels like her spasms may be slightly worse, but she feels like it may be stress related - She is still taking topiramate, but isn't sure it is an adequate response now - She will occasionally hear a "zap/swoosh" sound on that side and would now like to workup further with neuro - requesting referral   Additionally she would like to discuss a dental infection she noticed a few weeks ago. States it has  noticed it in the past, but this time she has not been able to get in with a dentist. She is reporting pain, redness, swelling to the middle of her lower gum. Denies any systemic symptoms.     ROS All review of systems negative except what is listed in the HPI    Objective:     BP 120/85   Pulse 82   Temp 98 F (36.7 C)   Resp 16   Ht 5\' 4"  (1.626 m)   Wt 220 lb (99.8 kg)   SpO2 100%   BMI 37.76 kg/m    Physical Exam Vitals reviewed.  Constitutional:      Appearance: Normal appearance.  Cardiovascular:     Rate and Rhythm: Normal rate and regular rhythm.     Pulses: Normal pulses.     Heart sounds: Normal heart sounds.  Pulmonary:     Effort: Pulmonary effort is normal.     Breath sounds: Normal breath sounds.  Skin:    General: Skin is warm and dry.  Neurological:     Mental Status: She is alert and oriented to person, place, and time.     Comments: Left eye twitch and facial spasm  Psychiatric:        Mood and Affect: Mood normal.        Behavior: Behavior normal.        Thought Content: Thought content normal.        Judgment: Judgment normal.        No results found for any visits on  07/01/22.    The 10-year ASCVD risk score (Arnett DK, et al., 2019) is: 5.8%    Assessment & Plan:   Problem List Items Addressed This Visit       Cardiovascular and Mediastinum   Essential hypertension - Primary    Blood pressure is at goal for age and co-morbidities.  I recommend continuing amlodipine 10 mg daily, metoprolol 25 mg daily, Diovan 320/25 mg daily.   - BP goal <130/80 - monitor and log blood pressures at home - check around the same time each day in a relaxed setting - Limit salt to <2000 mg/day - Follow DASH eating plan (heart healthy diet) - limit alcohol to 2 standard drinks per day for men and 1 per day for women - avoid tobacco products - get at least 2 hours of regular aerobic exercise weekly Patient aware of signs/symptoms requiring  further/urgent evaluation. Labs updated today.        Relevant Orders   CBC with Differential/Platelet   Comprehensive metabolic panel   Lipid panel   TSH     Respiratory   Obstructive sleep apnea    Referral to Pulm for repeat sleep study Discussed sleep hygiene       Relevant Orders   Ambulatory referral to Pulmonology     Nervous and Auditory   Hemifacial spasm    Continue topiramate Referral to Neuro to further evaluate and manage worsening symptoms       Relevant Orders   Ambulatory referral to Neurology     Other   Anxiety and depression    Continue with counseling and current medication She is agreeable to referral for med management  No SI/HI today       Relevant Orders   Ambulatory referral to Behavioral Health   Hyperglycemia    Previous stable, prediabetes Updating labs today Discussed diet and exercise       Relevant Orders   Hemoglobin A1c   Other Visit Diagnoses     Oral infection     Adding Augmentin Warm salt water gargles Recommend follow-up with dentist     Relevant Medications   amoxicillin-clavulanate (AUGMENTIN) 875-125 MG tablet       Return in about 6 months (around 12/31/2022) for routine follow-up.    Clayborne Dana, NP

## 2022-07-01 NOTE — Assessment & Plan Note (Signed)
Blood pressure is at goal for age and co-morbidities.  I recommend continuing amlodipine 10 mg daily, metoprolol 25 mg daily, Diovan 320/25 mg daily.   - BP goal <130/80 - monitor and log blood pressures at home - check around the same time each day in a relaxed setting - Limit salt to <2000 mg/day - Follow DASH eating plan (heart healthy diet) - limit alcohol to 2 standard drinks per day for men and 1 per day for women - avoid tobacco products - get at least 2 hours of regular aerobic exercise weekly Patient aware of signs/symptoms requiring further/urgent evaluation. Labs updated today.

## 2022-07-01 NOTE — Assessment & Plan Note (Signed)
Continue with counseling and current medication She is agreeable to referral for med management  No SI/HI today

## 2022-07-01 NOTE — Assessment & Plan Note (Signed)
Referral to [facility] for repeat sleep study Discussed sleep hygiene

## 2022-07-01 NOTE — Assessment & Plan Note (Signed)
Previous stable, prediabetes Updating labs today Discussed diet and exercise

## 2022-07-01 NOTE — Assessment & Plan Note (Signed)
Continue topiramate Referral to Neuro to further evaluate and manage worsening symptoms

## 2022-07-02 LAB — TSH: TSH: 0.59 u[IU]/mL (ref 0.35–5.50)

## 2022-07-02 LAB — COMPREHENSIVE METABOLIC PANEL
ALT: 10 U/L (ref 0–35)
AST: 13 U/L (ref 0–37)
Albumin: 4.2 g/dL (ref 3.5–5.2)
Alkaline Phosphatase: 111 U/L (ref 39–117)
BUN: 24 mg/dL — ABNORMAL HIGH (ref 6–23)
CO2: 22 mEq/L (ref 19–32)
Calcium: 9.6 mg/dL (ref 8.4–10.5)
Chloride: 114 mEq/L — ABNORMAL HIGH (ref 96–112)
Creatinine, Ser: 1.16 mg/dL (ref 0.40–1.20)
GFR: 50.18 mL/min — ABNORMAL LOW (ref >60.00)
Glucose, Bld: 96 mg/dL (ref 70–99)
Potassium: 4.3 mEq/L (ref 3.5–5.1)
Sodium: 146 mEq/L — ABNORMAL HIGH (ref 135–145)
Total Bilirubin: 0.3 mg/dL (ref 0.2–1.2)
Total Protein: 7 g/dL (ref 6.0–8.3)

## 2022-07-02 LAB — CBC WITH DIFFERENTIAL/PLATELET
Basophils Absolute: 0.1 10*3/uL (ref 0.0–0.1)
Basophils Relative: 1.3 % (ref 0.0–3.0)
Eosinophils Absolute: 0.1 10*3/uL (ref 0.0–0.7)
Eosinophils Relative: 1.4 % (ref 0.0–5.0)
HCT: 43.6 % (ref 36.0–46.0)
Hemoglobin: 14.2 g/dL (ref 12.0–15.0)
Lymphocytes Relative: 45.6 % (ref 12.0–46.0)
Lymphs Abs: 2.9 10*3/uL (ref 0.7–4.0)
MCHC: 32.6 g/dL (ref 30.0–36.0)
MCV: 89.1 fl (ref 78.0–100.0)
Monocytes Absolute: 0.7 10*3/uL (ref 0.1–1.0)
Monocytes Relative: 10.5 % (ref 3.0–12.0)
Neutro Abs: 2.6 10*3/uL (ref 1.4–7.7)
Neutrophils Relative %: 41.2 % — ABNORMAL LOW (ref 43.0–77.0)
Platelets: 268 10*3/uL (ref 150.0–400.0)
RBC: 4.89 Mil/uL (ref 3.87–5.11)
RDW: 15.2 % (ref 11.5–15.5)
WBC: 6.4 10*3/uL (ref 4.0–10.5)

## 2022-07-02 LAB — LIPID PANEL
Cholesterol: 164 mg/dL (ref 0–200)
HDL: 72.7 mg/dL (ref >39.00)
LDL Cholesterol: 72 mg/dL (ref 0–99)
NonHDL: 91.78
Total CHOL/HDL Ratio: 2
Triglycerides: 101 mg/dL (ref 0.0–149.0)
VLDL: 20.2 mg/dL (ref 0.0–40.0)

## 2022-07-02 LAB — HEMOGLOBIN A1C: Hgb A1c MFr Bld: 5.9 % (ref 4.6–6.5)

## 2022-07-02 NOTE — Addendum Note (Signed)
Addended by: Hyman Hopes B on: 07/02/2022 12:19 PM   Modules accepted: Orders

## 2022-07-03 NOTE — Addendum Note (Signed)
Addended by: Hyman Hopes B on: 07/03/2022 07:57 AM   Modules accepted: Orders

## 2022-07-13 ENCOUNTER — Other Ambulatory Visit: Payer: Self-pay | Admitting: Family Medicine

## 2022-07-13 DIAGNOSIS — K122 Cellulitis and abscess of mouth: Secondary | ICD-10-CM

## 2022-07-27 ENCOUNTER — Other Ambulatory Visit: Payer: Self-pay | Admitting: Family Medicine

## 2022-08-02 ENCOUNTER — Other Ambulatory Visit: Payer: Self-pay | Admitting: Family Medicine

## 2022-08-02 DIAGNOSIS — G4700 Insomnia, unspecified: Secondary | ICD-10-CM

## 2022-08-07 ENCOUNTER — Other Ambulatory Visit: Payer: Self-pay | Admitting: Family Medicine

## 2022-08-07 DIAGNOSIS — F32A Depression, unspecified: Secondary | ICD-10-CM

## 2022-08-22 ENCOUNTER — Other Ambulatory Visit: Payer: Self-pay | Admitting: Family Medicine

## 2022-08-22 DIAGNOSIS — G5132 Clonic hemifacial spasm, left: Secondary | ICD-10-CM

## 2022-09-08 ENCOUNTER — Other Ambulatory Visit: Payer: Self-pay | Admitting: Family Medicine

## 2022-09-08 DIAGNOSIS — F32A Depression, unspecified: Secondary | ICD-10-CM

## 2022-09-18 ENCOUNTER — Other Ambulatory Visit: Payer: Self-pay | Admitting: Medical

## 2022-10-12 ENCOUNTER — Other Ambulatory Visit: Payer: Self-pay | Admitting: Family Medicine

## 2022-10-14 ENCOUNTER — Other Ambulatory Visit: Payer: Self-pay | Admitting: Family Medicine

## 2022-10-14 DIAGNOSIS — G4700 Insomnia, unspecified: Secondary | ICD-10-CM

## 2022-10-17 ENCOUNTER — Other Ambulatory Visit: Payer: Self-pay | Admitting: Family Medicine

## 2022-10-23 ENCOUNTER — Other Ambulatory Visit: Payer: Self-pay | Admitting: Family Medicine

## 2022-10-23 DIAGNOSIS — F32A Depression, unspecified: Secondary | ICD-10-CM

## 2023-01-03 ENCOUNTER — Other Ambulatory Visit: Payer: Self-pay | Admitting: Family Medicine

## 2023-01-03 DIAGNOSIS — G4700 Insomnia, unspecified: Secondary | ICD-10-CM

## 2023-01-08 ENCOUNTER — Other Ambulatory Visit: Payer: Self-pay | Admitting: Family Medicine

## 2023-01-20 ENCOUNTER — Other Ambulatory Visit: Payer: Self-pay | Admitting: Medical

## 2023-02-22 ENCOUNTER — Encounter: Payer: Self-pay | Admitting: Family Medicine

## 2023-03-02 ENCOUNTER — Telehealth (INDEPENDENT_AMBULATORY_CARE_PROVIDER_SITE_OTHER): Payer: No Typology Code available for payment source | Admitting: Family Medicine

## 2023-03-02 DIAGNOSIS — F419 Anxiety disorder, unspecified: Secondary | ICD-10-CM | POA: Diagnosis not present

## 2023-03-02 DIAGNOSIS — G4700 Insomnia, unspecified: Secondary | ICD-10-CM | POA: Diagnosis not present

## 2023-03-02 DIAGNOSIS — F32A Depression, unspecified: Secondary | ICD-10-CM | POA: Diagnosis not present

## 2023-03-02 MED ORDER — HYDROXYZINE PAMOATE 25 MG PO CAPS
25.0000 mg | ORAL_CAPSULE | Freq: Three times a day (TID) | ORAL | 1 refills | Status: DC | PRN
Start: 2023-03-02 — End: 2023-07-29

## 2023-03-02 MED ORDER — TRAZODONE HCL 50 MG PO TABS
25.0000 mg | ORAL_TABLET | Freq: Every evening | ORAL | 0 refills | Status: DC | PRN
Start: 2023-03-02 — End: 2023-04-19

## 2023-03-02 MED ORDER — SERTRALINE HCL 100 MG PO TABS
100.0000 mg | ORAL_TABLET | Freq: Every day | ORAL | 0 refills | Status: AC
Start: 2023-03-02 — End: ?

## 2023-03-02 NOTE — Assessment & Plan Note (Signed)
No SI/HI Refills provided Work accomodations filled out Patient aware that she will need to establish with behavioral health for counseling and ongoing accomodations if still needed

## 2023-03-02 NOTE — Progress Notes (Signed)
Virtual Video Visit via MyChart Note  I connected with  Janet Case on 03/02/23 at  8:20 AM EDT by the video enabled telemedicine application for MyChart, and verified that I am speaking with the correct person using two identifiers.   I introduced myself as a Publishing rights manager with the practice. We discussed the limitations of evaluation and management by telemedicine and the availability of in person appointments. The patient expressed understanding and agreed to proceed.  Participating parties in this visit include: The patient and the nurse practitioner listed.  The patient is: At home I am: In the office - [facility]  Subjective:    CC: work accomodations   HPI: Janet Case is a 62 y.o. year old female presenting today via MyChart today for work accomodation paperwork.  Last year we filled out paperwork to allow her to have 4 episodes per month of intermittent leave related to severe anxiety. She is now with a new employer and would like to continue this. I am agreeable, but discussed that it would be listed as for the next months, and she would need to establish with psychiatry for counseling and medication adjustments if needed. She is going to talk with her case worker before she wants me to place a referral. She is currently doing fair on her Zoloft 100 mg and PRN hydroxyzine and trazodone - we discussed med safety and risks. She is due for routine in-office follow-up with me and will schedule soon based on her new work schedule.      Past medical history, Surgical history, Family history not pertinant except as noted below, Social history, Allergies, and medications have been entered into the medical record, reviewed, and corrections made.   Review of Systems:  All review of systems negative except what is listed in the HPI   Objective:    General:  Speaking clearly in complete sentences. Absent shortness of breath noted.   Alert and  oriented x3.   Normal judgment.  Absent acute distress.   Impression and Recommendations:    Problem List Items Addressed This Visit     Insomnia    Refills provided Discussed sleepy hygiene and lifestyle measures      Relevant Medications   traZODone (DESYREL) 50 MG tablet   Anxiety and depression    No SI/HI Refills provided Work accomodations filled out Patient aware that she will need to establish with behavioral health for counseling and ongoing accomodations if still needed       Relevant Medications   hydrOXYzine (VISTARIL) 25 MG capsule   sertraline (ZOLOFT) 100 MG tablet   traZODone (DESYREL) 50 MG tablet       Follow-up if symptoms worsen or fail to improve. Due to schedule routine follow-up.    I discussed the assessment and treatment plan with the patient. The patient was provided an opportunity to ask questions and all were answered. The patient agreed with the plan and demonstrated an understanding of the instructions.   The patient was advised to call back or seek an in-person evaluation if the symptoms worsen or if the condition fails to improve as anticipated.    Clayborne Dana, NP

## 2023-03-02 NOTE — Assessment & Plan Note (Signed)
Refills provided Discussed sleepy hygiene and lifestyle measures

## 2023-04-08 ENCOUNTER — Encounter: Payer: Self-pay | Admitting: Family Medicine

## 2023-04-15 ENCOUNTER — Encounter: Payer: Self-pay | Admitting: Family Medicine

## 2023-04-15 ENCOUNTER — Ambulatory Visit: Payer: BC Managed Care – PPO | Admitting: Family Medicine

## 2023-04-15 VITALS — BP 152/99 | HR 76 | Temp 98.4°F | Resp 16 | Wt 213.0 lb

## 2023-04-15 DIAGNOSIS — F419 Anxiety disorder, unspecified: Secondary | ICD-10-CM | POA: Diagnosis not present

## 2023-04-15 DIAGNOSIS — F32A Depression, unspecified: Secondary | ICD-10-CM

## 2023-04-15 DIAGNOSIS — R739 Hyperglycemia, unspecified: Secondary | ICD-10-CM | POA: Diagnosis not present

## 2023-04-15 DIAGNOSIS — I10 Essential (primary) hypertension: Secondary | ICD-10-CM

## 2023-04-15 DIAGNOSIS — H60501 Unspecified acute noninfective otitis externa, right ear: Secondary | ICD-10-CM | POA: Diagnosis not present

## 2023-04-15 DIAGNOSIS — E785 Hyperlipidemia, unspecified: Secondary | ICD-10-CM | POA: Diagnosis not present

## 2023-04-15 LAB — COMPREHENSIVE METABOLIC PANEL
ALT: 12 U/L (ref 0–35)
AST: 15 U/L (ref 0–37)
Albumin: 4.5 g/dL (ref 3.5–5.2)
Alkaline Phosphatase: 120 U/L — ABNORMAL HIGH (ref 39–117)
BUN: 10 mg/dL (ref 6–23)
CO2: 29 meq/L (ref 19–32)
Calcium: 9.9 mg/dL (ref 8.4–10.5)
Chloride: 101 meq/L (ref 96–112)
Creatinine, Ser: 0.87 mg/dL (ref 0.40–1.20)
GFR: 70.48 mL/min (ref >60.00)
Glucose, Bld: 105 mg/dL — ABNORMAL HIGH (ref 70–99)
Potassium: 3.7 meq/L (ref 3.5–5.1)
Sodium: 138 meq/L (ref 135–145)
Total Bilirubin: 0.6 mg/dL (ref 0.2–1.2)
Total Protein: 7.3 g/dL (ref 6.0–8.3)

## 2023-04-15 LAB — CBC WITH DIFFERENTIAL/PLATELET
Basophils Absolute: 0 10*3/uL (ref 0.0–0.1)
Basophils Relative: 0.3 % (ref 0.0–3.0)
Eosinophils Absolute: 0.1 10*3/uL (ref 0.0–0.7)
Eosinophils Relative: 1.6 % (ref 0.0–5.0)
HCT: 47.2 % — ABNORMAL HIGH (ref 36.0–46.0)
Hemoglobin: 15.1 g/dL — ABNORMAL HIGH (ref 12.0–15.0)
Lymphocytes Relative: 51.3 % — ABNORMAL HIGH (ref 12.0–46.0)
Lymphs Abs: 3.8 10*3/uL (ref 0.7–4.0)
MCHC: 32 g/dL (ref 30.0–36.0)
MCV: 89 fL (ref 78.0–100.0)
Monocytes Absolute: 0.8 10*3/uL (ref 0.1–1.0)
Monocytes Relative: 10.2 % (ref 3.0–12.0)
Neutro Abs: 2.7 10*3/uL (ref 1.4–7.7)
Neutrophils Relative %: 36.6 % — ABNORMAL LOW (ref 43.0–77.0)
Platelets: 251 10*3/uL (ref 150.0–400.0)
RBC: 5.3 Mil/uL — ABNORMAL HIGH (ref 3.87–5.11)
RDW: 15.2 % (ref 11.5–15.5)
WBC: 7.4 10*3/uL (ref 4.0–10.5)

## 2023-04-15 LAB — HEMOGLOBIN A1C: Hgb A1c MFr Bld: 5.8 % (ref 4.6–6.5)

## 2023-04-15 LAB — LIPID PANEL
Cholesterol: 208 mg/dL — ABNORMAL HIGH (ref 0–200)
HDL: 80.4 mg/dL (ref >39.00)
LDL Cholesterol: 114 mg/dL — ABNORMAL HIGH (ref 0–99)
NonHDL: 127.87
Total CHOL/HDL Ratio: 3
Triglycerides: 71 mg/dL (ref 0.0–149.0)
VLDL: 14.2 mg/dL (ref 0.0–40.0)

## 2023-04-15 LAB — TSH: TSH: 4.19 u[IU]/mL (ref 0.35–5.50)

## 2023-04-15 MED ORDER — SERTRALINE HCL 100 MG PO TABS: 150.0000 mg | ORAL_TABLET | Freq: Every day | ORAL | 3 refills | Status: DC

## 2023-04-15 MED ORDER — CIPROFLOXACIN-DEXAMETHASONE 0.3-0.1 % OT SUSP
4.0000 [drp] | Freq: Two times a day (BID) | OTIC | 0 refills | Status: AC
Start: 2023-04-15 — End: 2023-04-22

## 2023-04-15 MED ORDER — ROSUVASTATIN CALCIUM 10 MG PO TABS: 10.0000 mg | ORAL_TABLET | Freq: Every day | ORAL | 2 refills | Status: DC

## 2023-04-15 NOTE — Progress Notes (Signed)
Acute Office Visit  Subjective:     Patient ID: Janet Case, female    DOB: [birth year], 62 y.o.   MRN: [id number]  Chief Complaint  Patient presents with   Ear Pain    Complains of bilateral ear pain and itchiness    Eye Drainage    Complains of left eye drainage    HPI Patient is in today for ear pain.    Discussed the use of AI scribe software for clinical note transcription with the patient, who gave verbal consent to proceed.  History of Present Illness   The patient, with a history of depression and anxiety, presents with right ear pain and itching for about a week. The pain is intermittent and worsens when lying on the right side. Over-the-counter Tylenol provided no relief. The patient also reports recent onset of nocturnal sneezing and nasal drainage, suggesting possible allergies.  In addition to the ear discomfort, the patient has been experiencing increased stress and anxiety due to a new job, which involves intense customer service interactions. Despite being on Zoloft 100mg  daily, the patient feels that her stress levels are not adequately managed and requests an increase in dosage.  The patient also reports occasional left eye watering and morning crusting, suggesting possible dry eye or viral/allergic conjunctivitis.  Furthermore, the patient mentions a history of plantar fasciitis in the right foot, which was previously treated by a foot and ankle specialist. The patient expresses a desire to revisit the specialist due to recurrence of symptoms. She is going to call them to schedule an appointment.   Lastly, the patient acknowledges a lapse in monitoring her blood pressure at home, despite a history of hypertension. The patient's diet and exercise habits are poor, but she confirms adherence to her blood pressure medication.                04/15/2023    8:52 AM 10/27/2021    2:53 PM 01/17/2021    1:05 PM  PHQ9 SCORE ONLY  PHQ-9 Total Score 10 20 13        04/15/2023    8:53 AM 10/27/2021    2:53 PM  GAD 7 : Generalized Anxiety Score  Nervous, Anxious, on Edge 1 3  Control/stop worrying 2 3  Worry too much - different things 2 2  Trouble relaxing 1 3  Restless 0 2  Easily annoyed or irritable  3  Afraid - awful might happen 0 0  Total GAD 7 Score  16  Anxiety Difficulty Somewhat difficult Somewhat difficult   Lab Results  Component Value Date   HGBA1C 5.9 07/01/2022        ROS All review of systems negative except what is listed in the HPI      Objective:    BP (!) 152/99   Pulse 76   Temp 98.4 F (36.9 C) (Oral)   Resp 16   Wt 213 lb (96.6 kg)   SpO2 96%   BMI 36.56 kg/m    Physical Exam Vitals reviewed.  Constitutional:      Appearance: Normal appearance.  HENT:     Head:     Comments: Right ear canal with erythema and inflammation; left ear normal; normal TMs    Nose: Nose normal.  Eyes:     Extraocular Movements: Extraocular movements intact.     Conjunctiva/sclera: Conjunctivae normal.     Pupils: Pupils are equal, round, and reactive to light.     Comments: Mild tearing of left  eye, no purulent drainage noted;  Cardiovascular:     Rate and Rhythm: Normal rate and regular rhythm.     Pulses: Normal pulses.     Heart sounds: Normal heart sounds.  Pulmonary:     Effort: Pulmonary effort is normal.     Breath sounds: Normal breath sounds.  Skin:    General: Skin is warm and dry.  Neurological:     Mental Status: She is alert and oriented to person, place, and time.     Comments: Left eye twitch and facial spasm  Psychiatric:        Mood and Affect: Mood normal.        Behavior: Behavior normal.        Thought Content: Thought content normal.        Judgment: Judgment normal.        No results found for any visits on 04/15/23.      Assessment & Plan:   Problem List Items Addressed This Visit       Active Problems   Essential hypertension    Blood pressure is not at goal for age and  co-morbidities (possibly due to not taking meds today).  I recommend continuing amlodipine 10 mg daily, metoprolol 25 mg daily, Diovan 320/25 mg daily. Nurse visit recheck in 2-3 weeks.  - BP goal <130/80 - monitor and log blood pressures at home - check around the same time each day in a relaxed setting - Limit salt to <2000 mg/day - Follow DASH eating plan (heart healthy diet) - limit alcohol to 2 standard drinks per day for men and 1 per day for women - avoid tobacco products - get at least 2 hours of regular aerobic exercise weekly Patient aware of signs/symptoms requiring further/urgent evaluation. Labs updated today.        Relevant Orders   Comprehensive metabolic panel   CBC with Differential/Platelet   TSH   Lipid panel   Anxiety and depression    Stress related to new job, feeling that Zoloft is not fully effective. -Increase Zoloft from 100mg  to 150mg  daily. -Continue Hydroxyzine as needed for anxiety, especially at night.      Relevant Medications   sertraline (ZOLOFT) 100 MG tablet   Hyperglycemia    Previous stable, prediabetes Updating labs today Discussed diet and exercise  Lab Results  Component Value Date   HGBA1C 5.9 07/01/2022         Relevant Orders   Comprehensive metabolic panel   Hemoglobin A1c   Lipid panel   Other Visit Diagnoses     Acute otitis externa of right ear, unspecified type    -  Primary Right ear pain and itching for one week, left ear starting to hurt. Canal appears red and swollen on examination, tympanic membrane normal. -Prescribe ear drops for external ear infection.      Relevant Medications   ciprofloxacin-dexamethasone (CIPRODEX) OTIC suspension       Meds ordered this encounter  Medications   sertraline (ZOLOFT) 100 MG tablet    Sig: Take 1.5 tablets (150 mg total) by mouth daily. Needs Office visit for more refills.    Dispense:  45 tablet    Refill:  3    Order Specific Question:   Supervising Provider     Answer:   Danise Edge A [[id number]]   ciprofloxacin-dexamethasone (CIPRODEX) OTIC suspension    Sig: Place 4 drops into the left ear 2 (two) times daily for 7 days.  Dispense:  2.8 mL    Refill:  0    Order Specific Question:   Supervising Provider    Answer:   Danise Edge A [[id number]]    Return in about 2 weeks (around 04/29/2023) for BP check with nurse.  Clayborne Dana, NP

## 2023-04-15 NOTE — Addendum Note (Signed)
Addended by: Hyman Hopes B on: 04/15/2023 09:25 PM   Modules accepted: Orders

## 2023-04-15 NOTE — Assessment & Plan Note (Signed)
Stress related to new job, feeling that Zoloft is not fully effective. -Increase Zoloft from 100mg  to 150mg  daily. -Continue Hydroxyzine as needed for anxiety, especially at night.

## 2023-04-15 NOTE — Assessment & Plan Note (Signed)
Blood pressure is not at goal for age and co-morbidities (possibly due to not taking meds today).  I recommend continuing amlodipine 10 mg daily, metoprolol 25 mg daily, Diovan 320/25 mg daily. Nurse visit recheck in 2-3 weeks.  - BP goal <130/80 - monitor and log blood pressures at home - check around the same time each day in a relaxed setting - Limit salt to <2000 mg/day - Follow DASH eating plan (heart healthy diet) - limit alcohol to 2 standard drinks per day for men and 1 per day for women - avoid tobacco products - get at least 2 hours of regular aerobic exercise weekly Patient aware of signs/symptoms requiring further/urgent evaluation. Labs updated today.

## 2023-04-15 NOTE — Assessment & Plan Note (Signed)
Previous stable, prediabetes Updating labs today Discussed diet and exercise  Lab Results  Component Value Date   HGBA1C 5.9 07/01/2022

## 2023-04-18 ENCOUNTER — Encounter: Payer: Self-pay | Admitting: Family Medicine

## 2023-04-18 DIAGNOSIS — Z1321 Encounter for screening for nutritional disorder: Secondary | ICD-10-CM

## 2023-04-18 DIAGNOSIS — G4733 Obstructive sleep apnea (adult) (pediatric): Secondary | ICD-10-CM

## 2023-04-18 DIAGNOSIS — G5132 Clonic hemifacial spasm, left: Secondary | ICD-10-CM

## 2023-04-19 ENCOUNTER — Other Ambulatory Visit: Payer: Self-pay | Admitting: Family Medicine

## 2023-04-19 DIAGNOSIS — G4700 Insomnia, unspecified: Secondary | ICD-10-CM

## 2023-04-29 ENCOUNTER — Ambulatory Visit: Payer: BC Managed Care – PPO

## 2023-05-02 ENCOUNTER — Other Ambulatory Visit: Payer: Self-pay | Admitting: Family Medicine

## 2023-06-14 ENCOUNTER — Encounter: Payer: Self-pay | Admitting: Family Medicine

## 2023-06-14 DIAGNOSIS — F32A Depression, unspecified: Secondary | ICD-10-CM

## 2023-07-29 ENCOUNTER — Encounter: Payer: Self-pay | Admitting: Family Medicine

## 2023-07-29 DIAGNOSIS — G5132 Clonic hemifacial spasm, left: Secondary | ICD-10-CM

## 2023-07-29 DIAGNOSIS — E785 Hyperlipidemia, unspecified: Secondary | ICD-10-CM

## 2023-07-29 DIAGNOSIS — G4700 Insomnia, unspecified: Secondary | ICD-10-CM

## 2023-07-29 DIAGNOSIS — F419 Anxiety disorder, unspecified: Secondary | ICD-10-CM

## 2023-07-29 MED ORDER — AMLODIPINE BESYLATE 10 MG PO TABS: 10.0000 mg | ORAL_TABLET | Freq: Every day | ORAL | 0 refills | Status: DC

## 2023-07-29 MED ORDER — TRAZODONE HCL 50 MG PO TABS: 50.0000 mg | ORAL_TABLET | Freq: Every evening | ORAL | 0 refills | Status: DC | PRN

## 2023-07-29 MED ORDER — VALSARTAN-HYDROCHLOROTHIAZIDE 320-25 MG PO TABS: 1.0000 | ORAL_TABLET | Freq: Every day | ORAL | 0 refills | Status: DC

## 2023-07-29 MED ORDER — MELOXICAM 7.5 MG PO TABS: 7.5000 mg | ORAL_TABLET | Freq: Every day | ORAL | 0 refills | Status: DC

## 2023-07-29 MED ORDER — TOPIRAMATE 50 MG PO TABS: 50.0000 mg | ORAL_TABLET | Freq: Two times a day (BID) | ORAL | 0 refills | Status: DC

## 2023-07-29 MED ORDER — HYDROXYZINE PAMOATE 25 MG PO CAPS: 25.0000 mg | ORAL_CAPSULE | Freq: Three times a day (TID) | ORAL | 0 refills | Status: AC | PRN

## 2023-07-29 MED ORDER — METOPROLOL SUCCINATE ER 25 MG PO TB24: 25.0000 mg | ORAL_TABLET | Freq: Every day | ORAL | 0 refills | Status: DC

## 2023-07-29 MED ORDER — ROSUVASTATIN CALCIUM 10 MG PO TABS: 10.0000 mg | ORAL_TABLET | Freq: Every day | ORAL | 0 refills | Status: DC

## 2023-07-29 MED ORDER — SERTRALINE HCL 100 MG PO TABS: 150.0000 mg | ORAL_TABLET | Freq: Every day | ORAL | 0 refills | Status: AC

## 2023-07-29 NOTE — Telephone Encounter (Signed)
Pended all meds for one 90 day supply of each. Last seen in October. Sign if appropriate.

## 2023-08-07 ENCOUNTER — Encounter: Payer: Self-pay | Admitting: Family Medicine

## 2023-08-13 ENCOUNTER — Encounter: Payer: Self-pay | Admitting: Family Medicine

## 2023-08-13 ENCOUNTER — Ambulatory Visit: Payer: BC Managed Care – PPO | Admitting: Family Medicine

## 2023-08-13 VITALS — BP 142/101 | HR 105 | Ht 64.0 in | Wt 216.0 lb

## 2023-08-13 DIAGNOSIS — I10 Essential (primary) hypertension: Secondary | ICD-10-CM | POA: Diagnosis not present

## 2023-08-13 DIAGNOSIS — L0291 Cutaneous abscess, unspecified: Secondary | ICD-10-CM | POA: Diagnosis not present

## 2023-08-13 DIAGNOSIS — Z1321 Encounter for screening for nutritional disorder: Secondary | ICD-10-CM | POA: Diagnosis not present

## 2023-08-13 LAB — VITAMIN D 25 HYDROXY (VIT D DEFICIENCY, FRACTURES): VITD: 34.23 ng/mL (ref 30.00–100.00)

## 2023-08-13 MED ORDER — DOXYCYCLINE HYCLATE 100 MG PO TABS: 100.0000 mg | ORAL_TABLET | Freq: Two times a day (BID) | ORAL | 0 refills | Status: AC

## 2023-08-13 NOTE — Progress Notes (Signed)
Acute Office Visit  Subjective:     Patient ID: Janet Case, female    DOB: [birth year], 63 y.o.   MRN: [id number]  Chief Complaint  Patient presents with   Skin Problem    HPI Patient is in today for abscess.  Discussed the use of AI scribe software for clinical note transcription with the patient, who gave verbal consent to proceed.  History of Present Illness   The patient presents with a boil on the left buttock.  She has had a boil on the left side of her buttock for about a week, located on the 'side panel' of her butt cheek. Initially, she attempted to squeeze it, resulting in pus drainage. She compared it to a previous cyst on her back, noting that the current boil did not have a smell. After squeezing it, she cleaned the area with peroxide using a cotton swab and applied a boil ointment containing benzocaine to alleviate pain. The pain has decreased, and the boil appears to be reducing in size. She denies fevers, chills.  She attributes her elevated blood pressure to stress, noting that she is no longer employed. Her current blood pressure medications include amlodipine 10 mg, metoprolol 25 mg, and valsartan hydrochlorothiazide 320/25 mg, which she is taking as prescribed. She has been monitoring her blood pressure at home, which has been elevated recently. She recently lost her job so that has been stressful for her.          ROS All review of systems negative except what is listed in the HPI      Objective:    BP (!) 142/101   Pulse (!) 105   Ht 5\' 4"  (1.626 m)   Wt 216 lb (98 kg)   SpO2 100%   BMI 37.08 kg/m    Physical Exam Vitals reviewed.  Constitutional:      Appearance: Normal appearance. She is obese.  Genitourinary:      Comments: ~1cm abscess, already drained, minimal induration and erythema, no fluctuance, mildly tender to palpation  Neurological:     Mental Status: She is alert and oriented to person, place, and time.      No results  found for any visits on 08/13/23.      Assessment & Plan:   Problem List Items Addressed This Visit       Active Problems   Essential hypertension   Other Visit Diagnoses       Abscess    -  Primary   Relevant Medications   doxycycline (VIBRA-TABS) 100 MG tablet     Encounter for vitamin deficiency screening         Drained spontaneously with some relief of pain. No significant surrounding erythema or induration. No systemic symptoms. -Prescribe Doxycycline BID for 5 days. -Advise warm sitz baths at least twice daily. -Keep clean. Change underwear/liner if sweaty or soiled.  Hypertension Despite being on three antihypertensive medications (Amlodipine 10mg , Metoprolol 25mg , Valsartan-Hydrochlorothiazide 320-25mg ), blood pressure remains elevated. Patient reports stress and lack of exercise. -Schedule nurse visit in 2-4 weeks for blood pressure check. -If blood pressure remains high, refer to HTN clinic. -Advise lifestyle modifications including stress management, exercise, sodium restriction, and cessation of alcohol and smoking.         Meds ordered this encounter  Medications   doxycycline (VIBRA-TABS) 100 MG tablet    Sig: Take 1 tablet (100 mg total) by mouth 2 (two) times daily for 5 days.    Dispense:  10  tablet    Refill:  0    Supervising Provider:   Danise Edge A [[id number]]    Return in about 2 weeks (around 08/27/2023) for BP check with nurse 2-4 weeks.  Clayborne Dana, NP

## 2023-08-15 ENCOUNTER — Encounter: Payer: Self-pay | Admitting: Family Medicine

## 2023-10-27 ENCOUNTER — Other Ambulatory Visit: Payer: Self-pay | Admitting: Family Medicine

## 2023-10-27 DIAGNOSIS — G4700 Insomnia, unspecified: Secondary | ICD-10-CM

## 2023-10-27 DIAGNOSIS — G5132 Clonic hemifacial spasm, left: Secondary | ICD-10-CM

## 2024-02-20 ENCOUNTER — Other Ambulatory Visit: Payer: Self-pay | Admitting: Family Medicine

## 2024-03-13 ENCOUNTER — Other Ambulatory Visit: Payer: Self-pay | Admitting: Family Medicine

## 2024-03-13 DIAGNOSIS — H60501 Unspecified acute noninfective otitis externa, right ear: Secondary | ICD-10-CM

## 2024-03-13 DIAGNOSIS — G5132 Clonic hemifacial spasm, left: Secondary | ICD-10-CM

## 2024-04-20 ENCOUNTER — Other Ambulatory Visit: Payer: Self-pay | Admitting: Family Medicine

## 2024-06-10 ENCOUNTER — Other Ambulatory Visit: Payer: Self-pay | Admitting: Family Medicine

## 2024-06-10 DIAGNOSIS — G4700 Insomnia, unspecified: Secondary | ICD-10-CM

## 2024-06-15 ENCOUNTER — Encounter: Payer: Self-pay | Admitting: Family Medicine

## 2024-06-15 ENCOUNTER — Ambulatory Visit: Admitting: Family Medicine

## 2024-06-15 VITALS — BP 172/114 | HR 89 | Temp 97.9°F | Ht 64.0 in | Wt 228.0 lb

## 2024-06-15 DIAGNOSIS — H6690 Otitis media, unspecified, unspecified ear: Secondary | ICD-10-CM | POA: Diagnosis not present

## 2024-06-15 DIAGNOSIS — Z1231 Encounter for screening mammogram for malignant neoplasm of breast: Secondary | ICD-10-CM | POA: Diagnosis not present

## 2024-06-15 DIAGNOSIS — Z780 Asymptomatic menopausal state: Secondary | ICD-10-CM | POA: Diagnosis not present

## 2024-06-15 MED ORDER — AMOXICILLIN-POT CLAVULANATE 875-125 MG PO TABS: 1.0000 | ORAL_TABLET | Freq: Two times a day (BID) | ORAL | 0 refills | Status: AC

## 2024-06-15 NOTE — Progress Notes (Signed)
Acute Office Visit  Subjective:  Patient ID: Janet Case, female    DOB: Mar 09, 1961  Age: 63 y.o. MRN: [id number]  CC:  Chief Complaint  Patient presents with   Ear Problem      HPI Janet Case is here for ear issue.  Discussed the use of AI scribe software for clinical note transcription with the patient, who gave verbal consent to proceed.  History of Present Illness Janet Case is a 63 year old female who presents with right ear pain and fullness.  She has been experiencing right ear pain and fullness for the past couple of weeks, which began after visiting her daughter who was sick. Patient ended up with a URI, but those symptoms have resolved. The pain is described as 'achy' and worsens when lying on her right side. There is no change in hearing, but she feels a sensation of clogged or muffled ears.  She recalls similar symptoms a few months ago that were successfully treated with ear drops, although she found them inconvenient due to leakage and prefers an oral medication this time. She has been managing the pain with Tylenol and occasionally alternating with Aleve or Advil   No congestion, sinus pain, fever, or ear drainage. She has not taken any recent antibiotics and has no known drug allergies.          Past Medical History:  Diagnosis Date   Anxiety    Clonic hemifacial spasm    Left Facial   Colon polyps    Depression    Hypertension    Muscle spasms of head and/or neck     Past Surgical History:  Procedure Laterality Date   POLYPECTOMY     Oral   TUBAL LIGATION     VAGINAL WOUND CLOSURE / REPAIR  1977   WISDOM TOOTH EXTRACTION      Family History  Problem Relation Age of Onset   Hyperlipidemia Mother 76       Deceased   Heart disease Mother    Congestive Heart Failure Mother    Hypertension Mother    Kidney disease Mother    Diabetes Mother    Dementia Mother    Heart defect Mother    Kidney disease Father 91       Deceased    Heart attack Father    Hypertension Sister    Diabetes Sister    Breast cancer Sister    Depression Sister    Anxiety disorder Sister    Thyroid  cancer Sister        Thyroid  Removed   Leukemia Maternal Aunt    Allergies Daughter    GI problems Daughter     Social History   Socioeconomic History   Marital status: Divorced    Spouse name: Not on file   Number of children: 2   Years of education: Not on file   Highest education level: Some college, no degree  Occupational History   Occupation: Occupational Psychologist    Comment: [employer]  Tobacco Use   Smoking status: Never   Smokeless tobacco: Never  Substance and Sexual Activity   Alcohol use: No    Alcohol/week: 0.0 standard drinks of alcohol   Drug use: No   Sexual activity: Never  Other Topics Concern   Not on file  Social History Narrative   Not on file   Social Drivers of Health   Financial Resource Strain: Medium Risk (06/14/2024)   Overall Financial Resource Strain (CARDIA)  Difficulty of Paying Living Expenses: Somewhat hard  Food Insecurity: No Food Insecurity (06/14/2024)   Hunger Vital Sign    Worried About Running Out of Food in the Last Year: Never true    Ran Out of Food in the Last Year: Never true  Transportation Needs: No Transportation Needs (06/14/2024)   PRAPARE - Administrator, Civil Service (Medical): No    Lack of Transportation (Non-Medical): No  Physical Activity: Inactive (06/14/2024)   Exercise Vital Sign    Days of Exercise per Week: 0 days    Minutes of Exercise per Session: Not on file  Stress: Stress Concern Present (06/14/2024)   [employer] of Occupational Health - Occupational Stress Questionnaire    Feeling of Stress: Rather much  Social Connections: Moderately Isolated (06/14/2024)   Social Connection and Isolation Panel    Frequency of Communication with Friends and Family: More than three times a week    Frequency of Social Gatherings with Friends and  Family: Twice a week    Attends Religious Services: More than 4 times per year    Active Member of [employer] or Organizations: No    Attends Engineer, Structural: Not on file    Marital Status: Divorced  Intimate Partner Violence: Not on file    ROS All ROS negative except what is listed in the HPI.   Objective:   Today's Vitals: BP (!) 172/114   Pulse 89   Temp 97.9 F (36.6 C) (Oral)   Ht 5' 4 (1.626 m)   Wt 228 lb (103.4 kg)   SpO2 95%   BMI 39.14 kg/m   Physical Exam Vitals reviewed.  Constitutional:      Appearance: Normal appearance.  HENT:     Right Ear: Tympanic membrane is erythematous and bulging.     Left Ear: A middle ear effusion is present.  Neurological:     Mental Status: She is alert and oriented to person, place, and time.  Psychiatric:        Mood and Affect: Mood normal.        Behavior: Behavior normal.        Thought Content: Thought content normal.        Judgment: Judgment normal.     Assessment & Plan:   Problem List Items Addressed This Visit   None Visit Diagnoses       Acute otitis media, unspecified otitis media type    -  Primary Start Augmentin  Recommend PRN tylenol and warm compresses. Add Flonase  2 sprays in each nostril daily  Patient aware of signs/symptoms requiring further/urgent evaluation.    Relevant Medications   amoxicillin -clavulanate (AUGMENTIN ) 875-125 MG tablet     Postmenopausal       Relevant Orders   DG Bone Density     Encounter for screening mammogram for malignant neoplasm of breast       Relevant Orders   MM 3D SCREENING MAMMOGRAM BILATERAL BREAST      Forgot to recheck BP before patient left. Called her - she reports feeling normal, states she hadn't taken her meds yet. Instructed her to check BP at home when relaxed and monitor for any symptoms. She is able to come in tomorrow morning for us  to recheck BP. Patient aware of signs/symptoms requiring further/urgent evaluation.         Follow-up: Return for physical at your convenience .   Waddell FURY Almarie, DNP, FNP-C  I,Emily Lagle,acting as a neurosurgeon for [employer]  Almarie, NP.,have documented all relevant documentation on the behalf of Waddell Janet Almarie, NP.   I, Waddell Janet Almarie, NP, have reviewed all documentation for this visit. The documentation on 06/15/2024 for the exam, diagnosis, procedures, and orders are all accurate and complete.

## 2024-06-16 ENCOUNTER — Ambulatory Visit

## 2024-06-16 DIAGNOSIS — I10 Essential (primary) hypertension: Secondary | ICD-10-CM | POA: Diagnosis not present

## 2024-06-16 NOTE — Progress Notes (Signed)
 Pt here for Blood pressure check per Waddell Beck,NP:She is able to come in tomorrow morning for us  to recheck BP.    Pt currently takes: Amlodipine  10 MG, Metoprolol  25 MG, Valsartan - hydrochlorothiazide  320 -25 MG   Pt reports compliance with medication.  BP today @ =132/80 HR = 84  Pt advised per Waddell Almarie PIETY: continue current medications and keep f/u on 09/21/2024

## 2024-06-19 ENCOUNTER — Other Ambulatory Visit: Payer: Self-pay | Admitting: Family Medicine

## 2024-06-19 ENCOUNTER — Encounter: Payer: Self-pay | Admitting: Family Medicine

## 2024-06-19 DIAGNOSIS — G5132 Clonic hemifacial spasm, left: Secondary | ICD-10-CM

## 2024-06-19 NOTE — Telephone Encounter (Signed)
 Attachment is about Qsymia

## 2024-06-22 NOTE — Addendum Note (Signed)
 Addended by: ALMARIE BIRMINGHAM B on: 06/22/2024 09:16 AM   Modules accepted: Orders

## 2024-07-18 ENCOUNTER — Other Ambulatory Visit: Payer: Self-pay | Admitting: Family Medicine

## 2024-07-18 DIAGNOSIS — G4700 Insomnia, unspecified: Secondary | ICD-10-CM

## 2024-07-18 DIAGNOSIS — E785 Hyperlipidemia, unspecified: Secondary | ICD-10-CM

## 2024-08-02 ENCOUNTER — Ambulatory Visit: Admitting: Pulmonary Disease

## 2024-08-17 ENCOUNTER — Encounter: Payer: Self-pay | Admitting: Family Medicine

## 2024-08-21 ENCOUNTER — Ambulatory Visit: Admitting: Family Medicine

## 2024-08-24 ENCOUNTER — Ambulatory Visit: Admitting: Family Medicine

## 2024-09-21 ENCOUNTER — Encounter: Payer: Self-pay | Admitting: Family Medicine

## 2024-09-26 ENCOUNTER — Ambulatory Visit: Admitting: Pulmonary Disease
